# Patient Record
Sex: Male | Born: 1965 | Race: White | Hispanic: No | Marital: Married | Smoking: Former smoker
Health system: Southern US, Community
[De-identification: ages and names within clinical notes are randomized; demographics above are authoritative.]

## PROBLEM LIST (undated history)

## (undated) DIAGNOSIS — R609 Edema, unspecified: Secondary | ICD-10-CM

## (undated) DIAGNOSIS — IMO0002 Reserved for concepts with insufficient information to code with codable children: Secondary | ICD-10-CM

## (undated) DIAGNOSIS — I509 Heart failure, unspecified: Secondary | ICD-10-CM

## (undated) DIAGNOSIS — G4730 Sleep apnea, unspecified: Secondary | ICD-10-CM

## (undated) DIAGNOSIS — F319 Bipolar disorder, unspecified: Secondary | ICD-10-CM

## (undated) DIAGNOSIS — E785 Hyperlipidemia, unspecified: Secondary | ICD-10-CM

## (undated) DIAGNOSIS — Z789 Other specified health status: Secondary | ICD-10-CM

## (undated) DIAGNOSIS — M1990 Unspecified osteoarthritis, unspecified site: Secondary | ICD-10-CM

## (undated) DIAGNOSIS — G629 Polyneuropathy, unspecified: Secondary | ICD-10-CM

## (undated) DIAGNOSIS — E039 Hypothyroidism, unspecified: Secondary | ICD-10-CM

## (undated) DIAGNOSIS — E119 Type 2 diabetes mellitus without complications: Secondary | ICD-10-CM

## (undated) DIAGNOSIS — R9430 Abnormal result of cardiovascular function study, unspecified: Secondary | ICD-10-CM

## (undated) DIAGNOSIS — E669 Obesity, unspecified: Secondary | ICD-10-CM

## (undated) DIAGNOSIS — T56891A Toxic effect of other metals, accidental (unintentional), initial encounter: Secondary | ICD-10-CM

## (undated) DIAGNOSIS — I872 Venous insufficiency (chronic) (peripheral): Secondary | ICD-10-CM

## (undated) DIAGNOSIS — I10 Essential (primary) hypertension: Secondary | ICD-10-CM

## (undated) DIAGNOSIS — F209 Schizophrenia, unspecified: Secondary | ICD-10-CM

## (undated) HISTORY — DX: Bipolar disorder, unspecified: F31.9

## (undated) HISTORY — DX: Reserved for concepts with insufficient information to code with codable children: IMO0002

## (undated) HISTORY — DX: Hypothyroidism, unspecified: E03.9

## (undated) HISTORY — DX: Toxic effect of other metals, accidental (unintentional), initial encounter: T56.891A

## (undated) HISTORY — DX: Abnormal result of cardiovascular function study, unspecified: R94.30

## (undated) HISTORY — DX: Sleep apnea, unspecified: G47.30

## (undated) HISTORY — DX: Other specified health status: Z78.9

## (undated) HISTORY — DX: Edema, unspecified: R60.9

## (undated) HISTORY — DX: Venous insufficiency (chronic) (peripheral): I87.2

## (undated) HISTORY — PX: TONSILLECTOMY: SUR1361

## (undated) HISTORY — DX: Obesity, unspecified: E66.9

## (undated) HISTORY — DX: Schizophrenia, unspecified: F20.9

---

## 2001-05-10 ENCOUNTER — Encounter (INDEPENDENT_AMBULATORY_CARE_PROVIDER_SITE_OTHER): Payer: Self-pay | Admitting: *Deleted

## 2001-05-10 ENCOUNTER — Ambulatory Visit (HOSPITAL_BASED_OUTPATIENT_CLINIC_OR_DEPARTMENT_OTHER): Admission: RE | Admit: 2001-05-10 | Discharge: 2001-05-11 | Payer: Self-pay | Admitting: Otolaryngology

## 2001-05-18 ENCOUNTER — Encounter: Payer: Self-pay | Admitting: Otolaryngology

## 2001-05-18 ENCOUNTER — Encounter: Admission: RE | Admit: 2001-05-18 | Discharge: 2001-05-18 | Payer: Self-pay | Admitting: Otolaryngology

## 2001-06-03 ENCOUNTER — Encounter: Payer: Self-pay | Admitting: Otolaryngology

## 2001-06-03 ENCOUNTER — Encounter: Admission: RE | Admit: 2001-06-03 | Discharge: 2001-06-03 | Payer: Self-pay | Admitting: Otolaryngology

## 2004-04-16 ENCOUNTER — Ambulatory Visit: Payer: Self-pay | Admitting: Family Medicine

## 2005-01-14 ENCOUNTER — Ambulatory Visit: Payer: Self-pay | Admitting: Psychiatry

## 2005-01-14 ENCOUNTER — Inpatient Hospital Stay (HOSPITAL_COMMUNITY): Admission: AD | Admit: 2005-01-14 | Discharge: 2005-01-17 | Payer: Self-pay | Admitting: Psychiatry

## 2005-03-11 ENCOUNTER — Ambulatory Visit: Payer: Self-pay | Admitting: Family Medicine

## 2005-10-23 ENCOUNTER — Ambulatory Visit: Payer: Self-pay | Admitting: Family Medicine

## 2005-10-30 ENCOUNTER — Ambulatory Visit: Payer: Self-pay | Admitting: Internal Medicine

## 2005-11-03 ENCOUNTER — Ambulatory Visit (HOSPITAL_COMMUNITY): Admission: RE | Admit: 2005-11-03 | Discharge: 2005-11-03 | Payer: Self-pay | Admitting: Internal Medicine

## 2005-11-03 ENCOUNTER — Ambulatory Visit: Payer: Self-pay | Admitting: Internal Medicine

## 2005-11-04 ENCOUNTER — Ambulatory Visit (HOSPITAL_COMMUNITY): Admission: RE | Admit: 2005-11-04 | Discharge: 2005-11-04 | Payer: Self-pay | Admitting: Internal Medicine

## 2006-03-31 ENCOUNTER — Ambulatory Visit: Payer: Self-pay | Admitting: Family Medicine

## 2006-07-15 ENCOUNTER — Ambulatory Visit: Payer: Self-pay | Admitting: Family Medicine

## 2006-09-18 ENCOUNTER — Ambulatory Visit: Payer: Self-pay | Admitting: Family Medicine

## 2006-09-22 ENCOUNTER — Ambulatory Visit: Payer: Self-pay | Admitting: Family Medicine

## 2006-10-15 ENCOUNTER — Ambulatory Visit: Payer: Self-pay | Admitting: Family Medicine

## 2007-05-31 ENCOUNTER — Encounter
Admission: RE | Admit: 2007-05-31 | Discharge: 2007-08-12 | Payer: Self-pay | Admitting: Physical Medicine & Rehabilitation

## 2007-05-31 ENCOUNTER — Ambulatory Visit: Payer: Self-pay | Admitting: Physical Medicine & Rehabilitation

## 2007-06-23 ENCOUNTER — Encounter
Admission: RE | Admit: 2007-06-23 | Discharge: 2007-06-24 | Payer: Self-pay | Admitting: Physical Medicine & Rehabilitation

## 2007-06-23 ENCOUNTER — Ambulatory Visit: Payer: Self-pay | Admitting: Physical Medicine & Rehabilitation

## 2007-07-30 ENCOUNTER — Encounter
Admission: RE | Admit: 2007-07-30 | Discharge: 2007-07-30 | Payer: Self-pay | Admitting: Physical Medicine & Rehabilitation

## 2007-09-01 ENCOUNTER — Encounter
Admission: RE | Admit: 2007-09-01 | Discharge: 2007-09-03 | Payer: Self-pay | Admitting: Physical Medicine & Rehabilitation

## 2007-09-03 ENCOUNTER — Ambulatory Visit: Payer: Self-pay | Admitting: Physical Medicine & Rehabilitation

## 2007-09-15 ENCOUNTER — Encounter
Admission: RE | Admit: 2007-09-15 | Discharge: 2007-12-14 | Payer: Self-pay | Admitting: Physical Medicine & Rehabilitation

## 2007-09-20 ENCOUNTER — Ambulatory Visit: Payer: Self-pay | Admitting: Physical Medicine & Rehabilitation

## 2007-11-22 ENCOUNTER — Ambulatory Visit: Payer: Self-pay | Admitting: Physical Medicine & Rehabilitation

## 2007-12-17 ENCOUNTER — Encounter
Admission: RE | Admit: 2007-12-17 | Discharge: 2007-12-17 | Payer: Self-pay | Admitting: Physical Medicine & Rehabilitation

## 2008-03-17 ENCOUNTER — Encounter
Admission: RE | Admit: 2008-03-17 | Discharge: 2008-05-09 | Payer: Self-pay | Admitting: Physical Medicine & Rehabilitation

## 2008-03-20 ENCOUNTER — Ambulatory Visit: Payer: Self-pay | Admitting: Physical Medicine & Rehabilitation

## 2008-04-06 ENCOUNTER — Ambulatory Visit: Payer: Self-pay | Admitting: Physical Medicine & Rehabilitation

## 2008-04-06 ENCOUNTER — Encounter
Admission: RE | Admit: 2008-04-06 | Discharge: 2008-07-05 | Payer: Self-pay | Admitting: Physical Medicine & Rehabilitation

## 2008-05-09 ENCOUNTER — Ambulatory Visit: Payer: Self-pay | Admitting: Physical Medicine & Rehabilitation

## 2008-06-09 ENCOUNTER — Ambulatory Visit: Payer: Self-pay | Admitting: Physical Medicine & Rehabilitation

## 2008-07-25 ENCOUNTER — Encounter
Admission: RE | Admit: 2008-07-25 | Discharge: 2008-10-10 | Payer: Self-pay | Admitting: Physical Medicine & Rehabilitation

## 2008-07-26 ENCOUNTER — Ambulatory Visit: Payer: Self-pay | Admitting: Physical Medicine & Rehabilitation

## 2008-08-23 ENCOUNTER — Ambulatory Visit: Payer: Self-pay | Admitting: Physical Medicine & Rehabilitation

## 2008-09-22 ENCOUNTER — Ambulatory Visit: Payer: Self-pay | Admitting: Physical Medicine & Rehabilitation

## 2008-10-10 ENCOUNTER — Encounter
Admission: RE | Admit: 2008-10-10 | Discharge: 2009-01-08 | Payer: Self-pay | Admitting: Physical Medicine & Rehabilitation

## 2008-10-20 ENCOUNTER — Ambulatory Visit: Payer: Self-pay | Admitting: Physical Medicine & Rehabilitation

## 2008-11-29 ENCOUNTER — Ambulatory Visit: Payer: Self-pay | Admitting: Physical Medicine & Rehabilitation

## 2009-01-05 ENCOUNTER — Ambulatory Visit: Payer: Self-pay | Admitting: Physical Medicine & Rehabilitation

## 2009-01-24 ENCOUNTER — Encounter
Admission: RE | Admit: 2009-01-24 | Discharge: 2009-04-24 | Payer: Self-pay | Admitting: Physical Medicine & Rehabilitation

## 2009-01-26 ENCOUNTER — Ambulatory Visit: Payer: Self-pay | Admitting: Physical Medicine & Rehabilitation

## 2009-02-23 ENCOUNTER — Ambulatory Visit: Payer: Self-pay | Admitting: Physical Medicine & Rehabilitation

## 2009-04-03 ENCOUNTER — Ambulatory Visit: Payer: Self-pay | Admitting: Physical Medicine & Rehabilitation

## 2009-05-01 ENCOUNTER — Encounter
Admission: RE | Admit: 2009-05-01 | Discharge: 2009-05-30 | Payer: Self-pay | Admitting: Physical Medicine & Rehabilitation

## 2009-05-02 ENCOUNTER — Ambulatory Visit: Payer: Self-pay | Admitting: Physical Medicine & Rehabilitation

## 2010-07-23 ENCOUNTER — Inpatient Hospital Stay (HOSPITAL_COMMUNITY)
Admission: AD | Admit: 2010-07-23 | Discharge: 2010-07-26 | DRG: 301 | Disposition: A | Discharge status: Home / Self Care | Payer: MEDICARE | Source: Ambulatory Visit | Attending: Internal Medicine | Admitting: Internal Medicine

## 2010-07-23 DIAGNOSIS — K219 Gastro-esophageal reflux disease without esophagitis: Secondary | ICD-10-CM | POA: Diagnosis present

## 2010-07-23 DIAGNOSIS — G4733 Obstructive sleep apnea (adult) (pediatric): Secondary | ICD-10-CM | POA: Diagnosis present

## 2010-07-23 DIAGNOSIS — I872 Venous insufficiency (chronic) (peripheral): Principal | ICD-10-CM | POA: Diagnosis present

## 2010-07-23 DIAGNOSIS — I10 Essential (primary) hypertension: Secondary | ICD-10-CM | POA: Diagnosis present

## 2010-07-23 DIAGNOSIS — F209 Schizophrenia, unspecified: Secondary | ICD-10-CM | POA: Diagnosis present

## 2010-07-23 DIAGNOSIS — F17200 Nicotine dependence, unspecified, uncomplicated: Secondary | ICD-10-CM | POA: Diagnosis present

## 2010-07-23 DIAGNOSIS — E7800 Pure hypercholesterolemia, unspecified: Secondary | ICD-10-CM | POA: Diagnosis present

## 2010-07-24 ENCOUNTER — Inpatient Hospital Stay (HOSPITAL_COMMUNITY): Payer: MEDICARE

## 2010-07-24 LAB — CBC
HCT: 38.5 % — ABNORMAL LOW (ref 39.0–52.0)
MCHC: 30.6 g/dL (ref 30.0–36.0)
Platelets: 389 10*3/uL (ref 150–400)
RDW: 15.5 % (ref 11.5–15.5)
WBC: 7.9 10*3/uL (ref 4.0–10.5)

## 2010-07-24 LAB — COMPREHENSIVE METABOLIC PANEL
Albumin: 3.5 g/dL (ref 3.5–5.2)
BUN: 4 mg/dL — ABNORMAL LOW (ref 6–23)
CO2: 32 mEq/L (ref 19–32)
Calcium: 9.1 mg/dL (ref 8.4–10.5)
Chloride: 102 mEq/L (ref 96–112)
Creatinine, Ser: 0.73 mg/dL (ref 0.4–1.5)
GFR calc Af Amer: >60 mL/min (ref >60)
GFR calc non Af Amer: >60 mL/min (ref >60)
Total Bilirubin: 0.4 mg/dL (ref 0.3–1.2)

## 2010-07-24 LAB — LITHIUM LEVEL: Lithium Lvl: 0.4 mEq/L — ABNORMAL LOW (ref 0.80–1.40)

## 2010-07-24 LAB — DIFFERENTIAL
Basophils Absolute: 0.1 10*3/uL (ref 0.0–0.1)
Eosinophils Absolute: 0.5 10*3/uL (ref 0.0–0.7)
Eosinophils Relative: 7 % — ABNORMAL HIGH (ref 0–5)
Lymphocytes Relative: 28 % (ref 12–46)
Monocytes Absolute: 0.9 10*3/uL (ref 0.1–1.0)

## 2010-07-24 LAB — URINALYSIS, ROUTINE W REFLEX MICROSCOPIC
Nitrite: NEGATIVE
Specific Gravity, Urine: <1.005 — ABNORMAL LOW (ref 1.005–1.030)
Urobilinogen, UA: 0.2 mg/dL (ref 0.0–1.0)
pH: 7 (ref 5.0–8.0)

## 2010-07-24 LAB — TSH: TSH: 0.836 u[IU]/mL (ref 0.350–4.500)

## 2010-07-25 LAB — URINE CULTURE: Colony Count: NO GROWTH

## 2010-07-25 LAB — CBC
Platelets: 406 10*3/uL — ABNORMAL HIGH (ref 150–400)
RBC: 4.14 MIL/uL — ABNORMAL LOW (ref 4.22–5.81)
WBC: 10.4 10*3/uL (ref 4.0–10.5)

## 2010-07-25 LAB — DIFFERENTIAL
Basophils Absolute: 0.1 10*3/uL (ref 0.0–0.1)
Basophils Relative: 1 % (ref 0–1)
Eosinophils Absolute: 0.3 10*3/uL (ref 0.0–0.7)
Neutro Abs: 6.9 10*3/uL (ref 1.7–7.7)
Neutrophils Relative %: 67 % (ref 43–77)

## 2010-07-25 LAB — BASIC METABOLIC PANEL
Calcium: 9.4 mg/dL (ref 8.4–10.5)
GFR calc Af Amer: >60 mL/min (ref >60)
GFR calc non Af Amer: >60 mL/min (ref >60)
Glucose, Bld: 113 mg/dL — ABNORMAL HIGH (ref 70–99)
Sodium: 140 mEq/L (ref 135–145)

## 2010-07-25 LAB — TSH: TSH: 0.292 u[IU]/mL — ABNORMAL LOW (ref 0.350–4.500)

## 2010-07-25 LAB — BRAIN NATRIURETIC PEPTIDE: Pro B Natriuretic peptide (BNP): <30 pg/mL (ref 0.0–100.0)

## 2010-07-25 LAB — T3, FREE: T3, Free: 2.7 pg/mL (ref 2.3–4.2)

## 2010-07-25 LAB — D-DIMER, QUANTITATIVE: D-Dimer, Quant: 0.49 ug/mL-FEU — ABNORMAL HIGH (ref 0.00–0.48)

## 2010-07-25 LAB — MAGNESIUM: Magnesium: 2.3 mg/dL (ref 1.5–2.5)

## 2010-07-26 LAB — BASIC METABOLIC PANEL
CO2: 28 mEq/L (ref 19–32)
Chloride: 104 mEq/L (ref 96–112)
GFR calc Af Amer: >60 mL/min (ref >60)
Sodium: 142 mEq/L (ref 135–145)

## 2010-07-26 LAB — CBC
Hemoglobin: 12.1 g/dL — ABNORMAL LOW (ref 13.0–17.0)
MCH: 27.9 pg (ref 26.0–34.0)
RBC: 4.33 MIL/uL (ref 4.22–5.81)

## 2010-07-26 LAB — DIFFERENTIAL
Basophils Absolute: 0 10*3/uL (ref 0.0–0.1)
Basophils Relative: 0 % (ref 0–1)
Lymphocytes Relative: 24 % (ref 12–46)
Monocytes Relative: 11 % (ref 3–12)
Neutro Abs: 5.5 10*3/uL (ref 1.7–7.7)
Neutrophils Relative %: 61 % (ref 43–77)

## 2010-07-29 NOTE — Discharge Summary (Signed)
  Mark, Lester               ACCOUNT NO.:  [id number]  MEDICAL RECORD NO.:  [id number]           PATIENT TYPE:  I  LOCATION:  A339                          FACILITY:  [facility]  PHYSICIAN:  Wilson Singer, M.D.DATE OF BIRTH:  [birth year]  DATE OF ADMISSION:  07/23/2010 DATE OF DISCHARGE:  02/24/2012LH                              DISCHARGE SUMMARY   FINAL DISCHARGE DIAGNOSIS:  Lower extremity swelling and edema, likely secondary to venous insufficiency.  No evidence of congestive heart failure.  CONDITION ON DISCHARGE:  Stable.  MEDICATIONS ON DISCHARGE: 1. Lasix 40 mg b.i.d. 2. Spironolactone 50 mg daily. 3. Continue home medications including hydrocodone/APAP 10/325 one     tablet q.i.d. p.r.n. 4. Simvastatin 40 mg at bedtime. 5. Topiramate 100 mg at bedtime. 6. Dicyclomine 20 mg q.i.d. 7. Clomipramine 25 mg 1-2 tablets b.i.d. 8. Nexium 40 mg daily. 9. Lithium 300 mg 2 tablets b.i.d. 10.Amlodipine 5 mg daily. 11.Levothyroxine 100 mcg daily. 12.Alprazolam 1 mg q.i.d. 13.Metoclopramide 10 mg p.r.n. 14.Venlafaxine 75 mg t.i.d.  HISTORY:  This pleasant 45 year old man was admitted with anasarca. Please see initial history and physical examination done by Dr. Houston Siren.  HOSPITAL PROGRESS:  The patient initially was felt to be in congestive heart failure and therefore he was diuresed with intravenous diuretics. His weight on admission was 170.1 kg and on the day of discharge, today, is 163.6 kg, resulting in a total weight loss in the region of almost 6- 1/2 kg.  He was also seen by Cardiology and an echocardiogram was done. An echocardiogram did not show any evidence of systolic dysfunction nor was there any suggestion of diastolic dysfunction.  More interestingly, there was no evidence of right heart failure.  His BNP consistently remained less than 30.  It was reviewed by Dr. Rennis Golden, cardiologist, with whom I agree with that this most likely represents lower  extremity venous insufficiency, accounting for his leg swelling.  He clearly feels much improved on intravenous diuretics therapy and he can see that his legs have clearly improved.  On physical examination, he is doing well. Vital signs; temperature 97.7, blood pressure 138/82, pulse 78, saturation 94% on room air.  As mentioned, his current weight today is 163.6 kg.  Legs are much less swollen with very little pitting edema now.  Lung fields are clear.  Heart sounds are present and normal.  INVESTIGATIONS:  Today, show sodium of 142, potassium 3.6, bicarbonate 28, BUN 5, creatinine 0.74.  Hemoglobin 12.1, white blood cell count 9.1, platelets 406.  DISPOSITION:  The patient is now stable to be discharged home.  He will continue with a CPAP machine for his obstructive sleep apnea.  He needs to follow up with Dr. Rennis Golden, the cardiologist, in approximately 1 week's time to further evaluate him and with ongoing management of his peripheral edema.     Wilson Singer, M.D.     NCG/MEDQ  D:  07/26/2010  T:  07/27/2010  Job:  [id number] cc:   Italy Hilty, MD  Electronically Signed by Lilly Cove M.D. on 07/29/2010 11:52:35 AM

## 2010-08-12 NOTE — H&P (Signed)
[name], [name]               ACCOUNT NO.:  [id number]  MEDICAL RECORD NO.:  [id number]           PATIENT TYPE:  I  LOCATION:  A339                          FACILITY:  [facility]  PHYSICIAN:  Houston Siren, MD           DATE OF BIRTH:  1966/01/03  DATE OF ADMISSION:  07/23/2010 DATE OF DISCHARGE:  LH                             HISTORY & PHYSICAL   ADVANCE DIRECTIVE:  Full code.  REASON FOR ADMISSION:  Anasarca.  HISTORY OF PRESENT ILLNESS:  This is a 45 year old male, morbidly obese extreme noncompliant, along with having history of sleep apnea, hypertension and asthma, hypercholesterolemia, GERD and unfortunately is still a smoker, sent in by his cardiologist to be diuresed.  Apparently, he has been having swelling and redness of both of his lower extremities.  He denied any chest pain, but admitted to having shortness of breath and some orthopnea.  He denied any fever or chills, nausea or vomiting.  He has no diaphoresis and denied any productive cough.  I have no outpatient medical record, and his E-chart is rather scanty, but I do have a list of his medication.  Cardiology is aware of his admission and we will consult on him tomorrow morning.  PAST MEDICAL HISTORY:  Morbid obesity, noncompliance, sleep apnea, hypertension, asthma, hypercholesterolemia, tobacco use.  SOCIAL HISTORY:  He lives with his wife and as noted he is a smoker. Denies any excessive alcohol use.  Allergy to ASPIRIN and PENICILLIN.  FAMILY HISTORY:  Colorectal cancer, hypertension, and hypercholesterolemia.  CURRENT MEDICATIONS: 1. Effexor 75 mg t.i.d. 2. Reglan. 3. Alprazolam 2 mg half-tablet p.o. q.i.d. 4. Synthroid 100 mcg per day. 5. Norvasc 5 mg per day. 6. Lithium 300 mg 2 tablets p.o. b.i.d. 7. Nexium 40 mg per day. 8. Simvastatin 40 mg per day. 9. Topiramate 100 mg bedtime. 10.Clomipramine 25 mg one in the morning and 2 tablets in the evening. 11.Dicyclomine 20 mg q.i.d. 12.Lasix 80 mg  q.a.m.  PHYSICAL EXAMINATION:  VITAL SIGNS:  Blood pressure 144/80, O2 sat 96%, temp 98.7, pulse of 85. GENERAL:  He is morbidly obese, but no respiratory distress.  He is actually supple.  He has no stridor.  He has facial symmetry.  His speech is fluent.  Tongue is midline. NECK:  Supple. CARDIAC:  Revealed S1 and S2 regular.  I did not hear any murmur, rub, or gallop. LUNGS:  Without any wheezes.  Slight basilar crackles. ABDOMEN:  Obese, I am not able to appreciate any organomegaly. EXTREMITIES:  Show 4+ pitting edema bilaterally.  There is some erythema on his lower extremities as well.  No calf tenderness.  OBJECTIVE FINDING:  There has been no lab, EKG, or chest x-ray done.  IMPRESSION:  This is a 45 year old male with history of hypertension, sleep apnea, noncompliant, hypercholesterolemia, GERD, asthma, and presumably bipolar on several medications, admitted for bilateral lower leg swelling and anasarca.  If he has congestive heart failure, it is well compensated.  We will treat him with Lasix at 40 mg IV q.12 hours and hopefully, this will bypass intestinal edema.  Aldactone will  be a good medicine for him as well, and I will start him on 50 mg per day.  He has sleep apnea, and he absolutely must wear his CPAP machine.  I spent some time discussing that, and he is agreeable to start wearing it tonight. He should also stop smoking.  Of his medications, Norvasc might cause lower extremity edema and thus I will stop that.  He has been on lithium and I will check a TSH along with lithium level.  We will continue his other medications except for the clomipramine which may cause weight gain, I will hold that right now as well.  Cardiology will see him, and I will get admitting labs to include BMP, CBC, CMET.  We will get EKG and chest x-ray as well.  If he has not had an echo, he certainly will need one.  He does have potential lower extremity cellulitis, and I will start him on  Avelox IV daily.  He is a full code, will be admitted to [facility], Team-1.     Houston Siren, MD     PL/MEDQ  D:  07/24/2010  T:  07/24/2010  Job:  [id number]  Electronically Signed by Houston Siren  on 08/12/2010 05:54:22 AM

## 2010-09-09 ENCOUNTER — Other Ambulatory Visit (HOSPITAL_COMMUNITY): Payer: Self-pay | Admitting: Family Medicine

## 2010-09-09 DIAGNOSIS — R51 Headache: Secondary | ICD-10-CM

## 2010-09-09 DIAGNOSIS — R4789 Other speech disturbances: Secondary | ICD-10-CM

## 2010-09-11 ENCOUNTER — Other Ambulatory Visit (HOSPITAL_COMMUNITY): Payer: MEDICARE

## 2010-09-12 ENCOUNTER — Inpatient Hospital Stay (HOSPITAL_COMMUNITY)
Admission: EM | Admit: 2010-09-12 | Discharge: 2010-09-16 | DRG: 897 | Disposition: A | Discharge status: Home / Self Care | Payer: Medicare Other | Attending: Internal Medicine | Admitting: Internal Medicine

## 2010-09-12 ENCOUNTER — Inpatient Hospital Stay (HOSPITAL_COMMUNITY): Payer: Medicare Other

## 2010-09-12 ENCOUNTER — Emergency Department (HOSPITAL_COMMUNITY): Payer: Medicare Other

## 2010-09-12 DIAGNOSIS — I4891 Unspecified atrial fibrillation: Secondary | ICD-10-CM | POA: Diagnosis present

## 2010-09-12 DIAGNOSIS — E876 Hypokalemia: Secondary | ICD-10-CM | POA: Diagnosis present

## 2010-09-12 DIAGNOSIS — D649 Anemia, unspecified: Secondary | ICD-10-CM | POA: Diagnosis present

## 2010-09-12 DIAGNOSIS — E039 Hypothyroidism, unspecified: Secondary | ICD-10-CM | POA: Diagnosis present

## 2010-09-12 DIAGNOSIS — G894 Chronic pain syndrome: Secondary | ICD-10-CM | POA: Diagnosis present

## 2010-09-12 DIAGNOSIS — K589 Irritable bowel syndrome without diarrhea: Secondary | ICD-10-CM | POA: Diagnosis present

## 2010-09-12 DIAGNOSIS — G4733 Obstructive sleep apnea (adult) (pediatric): Secondary | ICD-10-CM | POA: Diagnosis present

## 2010-09-12 DIAGNOSIS — T438X5A Adverse effect of other psychotropic drugs, initial encounter: Secondary | ICD-10-CM | POA: Diagnosis present

## 2010-09-12 DIAGNOSIS — E785 Hyperlipidemia, unspecified: Secondary | ICD-10-CM | POA: Diagnosis present

## 2010-09-12 DIAGNOSIS — N179 Acute kidney failure, unspecified: Secondary | ICD-10-CM | POA: Diagnosis present

## 2010-09-12 DIAGNOSIS — E871 Hypo-osmolality and hyponatremia: Secondary | ICD-10-CM | POA: Diagnosis present

## 2010-09-12 DIAGNOSIS — I509 Heart failure, unspecified: Secondary | ICD-10-CM | POA: Diagnosis present

## 2010-09-12 DIAGNOSIS — R471 Dysarthria and anarthria: Secondary | ICD-10-CM | POA: Diagnosis present

## 2010-09-12 DIAGNOSIS — F19921 Other psychoactive substance use, unspecified with intoxication with delirium: Principal | ICD-10-CM | POA: Diagnosis present

## 2010-09-12 DIAGNOSIS — F319 Bipolar disorder, unspecified: Secondary | ICD-10-CM | POA: Diagnosis present

## 2010-09-12 LAB — URINALYSIS, ROUTINE W REFLEX MICROSCOPIC
Nitrite: NEGATIVE
Specific Gravity, Urine: <1.005 — ABNORMAL LOW (ref 1.005–1.030)
pH: 6 (ref 5.0–8.0)

## 2010-09-12 LAB — COMPREHENSIVE METABOLIC PANEL
AST: 13 U/L (ref 0–37)
CO2: 24 mEq/L (ref 19–32)
Calcium: 9.1 mg/dL (ref 8.4–10.5)
Creatinine, Ser: 2.71 mg/dL — ABNORMAL HIGH (ref 0.4–1.5)
GFR calc Af Amer: 31 mL/min — ABNORMAL LOW (ref >60)
GFR calc non Af Amer: 25 mL/min — ABNORMAL LOW (ref >60)
Total Protein: 6.7 g/dL (ref 6.0–8.3)

## 2010-09-12 LAB — DIFFERENTIAL
Lymphocytes Relative: 16 % (ref 12–46)
Monocytes Absolute: 1.5 10*3/uL — ABNORMAL HIGH (ref 0.1–1.0)
Monocytes Relative: 13 % — ABNORMAL HIGH (ref 3–12)
Neutro Abs: 7.6 10*3/uL (ref 1.7–7.7)

## 2010-09-12 LAB — CBC
HCT: 33.2 % — ABNORMAL LOW (ref 39.0–52.0)
Hemoglobin: 10.7 g/dL — ABNORMAL LOW (ref 13.0–17.0)
MCH: 28.8 pg (ref 26.0–34.0)
MCHC: 32.2 g/dL (ref 30.0–36.0)

## 2010-09-12 LAB — RAPID URINE DRUG SCREEN, HOSP PERFORMED
Barbiturates: NOT DETECTED
Cocaine: NOT DETECTED
Opiates: POSITIVE — AB

## 2010-09-12 LAB — TROPONIN I: Troponin I: <0.01 ng/mL (ref 0.00–0.06)

## 2010-09-12 LAB — BLOOD GAS, ARTERIAL
Acid-base deficit: 1.5 mmol/L (ref 0.0–2.0)
Bicarbonate: 22.7 mEq/L (ref 20.0–24.0)
FIO2: 0.21 %
pCO2 arterial: 38 mmHg (ref 35.0–45.0)
pO2, Arterial: 78.3 mmHg — ABNORMAL LOW (ref 80.0–100.0)

## 2010-09-12 LAB — AMMONIA: Ammonia: 59 umol/L — ABNORMAL HIGH (ref 11–35)

## 2010-09-12 LAB — BRAIN NATRIURETIC PEPTIDE: Pro B Natriuretic peptide (BNP): 238 pg/mL — ABNORMAL HIGH (ref 0.0–100.0)

## 2010-09-12 LAB — CARDIAC PANEL(CRET KIN+CKTOT+MB+TROPI): CK, MB: 0.8 ng/mL (ref 0.3–4.0)

## 2010-09-13 ENCOUNTER — Inpatient Hospital Stay (HOSPITAL_COMMUNITY): Payer: Medicare Other

## 2010-09-13 LAB — RENAL FUNCTION PANEL
CO2: 24 mEq/L (ref 19–32)
Calcium: 8.4 mg/dL (ref 8.4–10.5)
GFR calc Af Amer: 36 mL/min — ABNORMAL LOW (ref >60)
GFR calc non Af Amer: 30 mL/min — ABNORMAL LOW (ref >60)
Sodium: 132 mEq/L — ABNORMAL LOW (ref 135–145)

## 2010-09-13 LAB — CARDIAC PANEL(CRET KIN+CKTOT+MB+TROPI)
CK, MB: 0.8 ng/mL (ref 0.3–4.0)
Relative Index: INVALID (ref 0.0–2.5)
Total CK: 39 U/L (ref 7–232)
Total CK: 46 U/L (ref 7–232)

## 2010-09-13 LAB — IRON AND TIBC
Iron: 67 ug/dL (ref 42–135)
TIBC: 292 ug/dL (ref 215–435)

## 2010-09-13 LAB — DIFFERENTIAL
Basophils Absolute: 0 10*3/uL (ref 0.0–0.1)
Lymphocytes Relative: 24 % (ref 12–46)
Lymphs Abs: 2.8 10*3/uL (ref 0.7–4.0)
Monocytes Relative: 12 % (ref 3–12)
Neutrophils Relative %: 62 % (ref 43–77)

## 2010-09-13 LAB — FERRITIN: Ferritin: 237 ng/mL (ref 22–322)

## 2010-09-13 LAB — CBC
HCT: 30 % — ABNORMAL LOW (ref 39.0–52.0)
Hemoglobin: 9.7 g/dL — ABNORMAL LOW (ref 13.0–17.0)
RDW: 14.4 % (ref 11.5–15.5)
WBC: 11.7 10*3/uL — ABNORMAL HIGH (ref 4.0–10.5)

## 2010-09-13 LAB — VITAMIN B12: Vitamin B-12: 238 pg/mL (ref 211–911)

## 2010-09-13 LAB — LIPID PANEL
LDL Cholesterol: 47 mg/dL (ref 0–99)
Triglycerides: 263 mg/dL — ABNORMAL HIGH (ref <150)

## 2010-09-13 LAB — AMMONIA: Ammonia: 61 umol/L — ABNORMAL HIGH (ref 11–35)

## 2010-09-13 NOTE — H&P (Signed)
NAME:  [name], [name]               ACCOUNT NO.:  [id number]  MEDICAL RECORD NO.:  [id number]           PATIENT TYPE:  I  LOCATION:  A316                          FACILITY:  [facility]  PHYSICIAN:  Elliot Cousin, M.D.    DATE OF BIRTH:  [birth year]  DATE OF ADMISSION:  09/12/2010 DATE OF DISCHARGE:  LH                             HISTORY & PHYSICAL   PRIMARY CARE PHYSICIAN:  Delaney Meigs, MD  PRIMARY CARDIOLOGIST:  Italy Hilty, MD  CHIEF COMPLAINT:  Shortness of breath, recent increase in abdominal girth, recent increase in leg swelling, loose stools, and increase in sleepiness.  HISTORY OF PRESENT ILLNESS:  The patient is a 45 year old man with a past medical history significant for presumed right heart dysfunction leading to congestive heart failure, bilateral lower extremity edema from both right heart failure and venous insufficiency, bipolar disorder, obstructive sleep apnea, and chronic pain syndrome.  He presents to the emergency department today with a chief complaint of shortness of breath, loose stools, recent leg swelling, and an increase in abdominal girth.  The patient's wife and sister report that he has been mildly confused, has had an increase in sleepiness, and has had a couple of falls, mostly nontraumatic.  They believe that the patient is abusing MS Contin and hydrocodone.  He is prescribed both. However, he frequently runs out too early and when he runs out, he acquires these medications off the street.  Apparently over the past several days, he has had an increase in bilateral lower extremity swelling and an increase in abdominal girth, which he attributes to swelling.  His cardiologist, Dr. Rennis Golden increased his Lasix from 60 mg twice daily to 80 mg twice daily, increased the spironolactone from 50 mg daily to 100 mg daily, and added lisinopril. The patient also has chronic abdominal pain and chronic loose stools secondary to IBS.  Generally in a 2-day period,  he has approximately 10 loose stools which he says is not unusual for him.  His urine output has decreased significantly.  His shortness of breath has been at rest and with activity.  He denies pleurisy.  He denies chest pain.  He occasionally has symptoms consistent with orthopnea.  He has had nausea and dry heaves, but no outright vomiting.  Occasionally, he has discomfort with urination, but not lately.  He has had no subjective fever or chills.  He has chronic pain in his knees, which he believes has attributed to his falls.  His sister and wife believe that he is overmedicating himself and becomes too sedated to walk. They also noticed recent tremors.  In the emergency department, the patient is afebrile and hemodynamically stable although his heart rate was initially 109.  His lab data are significant for BNP of 238, a serum sodium of 130, serum potassium of 3.3, BUN of 24, creatinine of 2.71, WBC of 11.2, hemoglobin of 10.7, and a chest x-ray that reveals no acute abnormalities including no evidence of pulmonary edema.  He is being admitted for further evaluation and management.  PAST MEDICAL HISTORY: 1. 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  The patient's 2-D  echocardiogram on July 24, 2010 revealed an ejection fraction     of 50%-55%, normal diastolic function, mildly dilated left atrium,     and mildly dilated right ventricle. 2. Hypertension. 3. Morbid obesity. 4. Obstructive sleep apnea. 5. Hypothyroidism. 6. Asthma. 7. Hyperlipidemia. 8. Hiatal hernia, antral erythema, per EGD in June 2007 by Dr. Karilyn Cota. 9. Normal colonoscopy with the exception of hemorrhoids per     colonoscopy in June 2007 by Dr. Karilyn Cota. 10.Chronic low back pain with a history of L4-L5 and sacroiliac     injections.  He is followed by [facility]. 11.Bipolar disorder, depression, and generalized anxiety.  MEDICATIONS: 1. Lasix 80 mg b.i.d., recently increased from 60 mg  b.i.d. 2. Spironolactone 100 mg daily, recently increased from 50 mg daily. 3. Morphine (MS Contin) 60 mg b.i.d. 4. Hydrocodone/APAP 03/324 mg four times daily. 5. Simvastatin 40 mg at bedtime. 6. Lisinopril 5 mg daily, recently added. 7. Nexium 40 mg daily. 8. Clomipramine 25 mg 1-2 tablets twice daily. 9. Synthroid 100 mcg daily. 10.Amlodipine 5 mg daily. 11.Lithium carbonate 300 mg 2 tablets b.i.d. 12.Alprazolam 1 mg four times daily. 13.Dicyclomine 20 mg four times daily. 14.Topiramate 100 mg at bedtime. 15.Venlafaxine 75 mg three times daily. 16.Temazepam 60 mg at bedtime. 17.Metoclopramide 10 mg q.a.c. and at bedtime p.r.n.  ALLERGIES:  The patient has allergies to ASPIRIN AND PENICILLIN.  SOCIAL HISTORY:  The patient is married.  [place]  He has no children.  He is disabled.  He is a former Forensic psychologist.  He smokes two and half packs of cigarettes per day.  He stopped drinking alcohol many years ago.  He denies illicit or street drugs.  FAMILY HISTORY:  His father is 62 years of age and he has degenerative joint disease.  His sister has a history of diverticulitis. His mother is 31 years of age and has hypertension.  REVIEW OF SYSTEMS:  As above in the history present illness.  In addition, the patient has chronic low back pain, chronic bilateral knee pain, chronic intermittent swelling in his legs, otherwise review of systems is negative.  PHYSICAL EXAMINATION:  VITAL SIGNS:  Temperature 97.9, blood pressure 121/45, pulse 77, respiratory rate 20, oxygen saturation 100%. GENERAL:  The patient is a 45 year old obese Caucasian man who is currently lying in bed, in no acute distress. HEENT:  Head is normocephalic, nontraumatic.  Pupils are equal, round, reactive to light.  Extraocular muscles are intact.  Conjunctivae are clear.  Sclerae are white.  Tympanic membranes not examined.  Nasal mucosa is dry.  No sinus tenderness.   Oropharynx reveals dry mucous membranes.  No posterior exudates or erythema. NECK:  Supple.  No adenopathy, no thyromegaly, no bruit or JVD. LUNGS:  Clear to auscultation bilaterally. HEART:  S1, S2 with ectopy versus irregularly irregular. ABDOMEN:  Morbidly obese, positive bowel sounds, soft, nontender, nondistended and no hepatosplenomegaly.  No obvious fluid wave. GU AND RECTAL:  Deferred. EXTREMITIES:  Pedal pulses palpable bilaterally.  No pretibial edema and no pedal edema.  No acute hot red joints.  No ecchymotic lesions, bruises or bumps.  The patient is able to raise each leg against gravity approximately 40 degrees.  He is able to raise both arms 90 degrees without difficulty. NEUROLOGIC:  The patient is initially lethargic and has mild dysarthria. He answers questions appropriately, but then he falls back to sleep.  When the nurse is attempting to insert  a Foley catheter, he becomes more alert and mildly agitated and refuses to have a catheter inserted.  Cranial nerves II-XII appeared to be intact with exception of mild dysarthria and mild ptosis bilaterally consistent. There is a mild tremor in all extremities with some muscle jerks. Strength is 5-/5 in the supine position.  Sensation is grossly intact.  ADMISSION LABORATORIES:  EKG reveals atrial fibrillation with junctional pacemaker rate and occasional PVCs, prolonged QT interval, and a heart rate of 79 beats per minute.  CT scan of the head without contrast reveals mild generalized atrophy, area of questionable low attenuation within the right cerebellar hemisphere, question artifact versus infarct, and no other intracranial abnormalities identified.  BNP 238. ABG on room air revealed a pH of 7.39, pCO2 of 38, pO2 of 78.3, and bicarb of 22.7.  Sodium 130, potassium 3.3, chloride 95, CO2 24, glucose 111, BUN 24, creatinine 2.71, total bilirubin 0.4, alkaline phosphatase 72, SGOT 13, SGPT 10, total protein 6.7, albumin  3.7, calcium 9.1, CK-MB less than 1.0, troponin I less than 0.05, myoglobin 290.  WBC 11.2, hemoglobin 10.7, platelet count 358.  ASSESSMENT: 1. Shortness of breath with no evidence of pulmonary edema or     congestive heart failure on the chest x-ray and exam. 2. History of bilateral lower extremity edema.  The patient has no     bilateral knee lower extremity edema whatsoever.  He does probably     have a history of venous insufficiency due to his morbid obesity.     Diuretic therapy was recently increased. 3. Acute renal failure.  The patient's BUN is 24 and his creatinine is     2.71.  He has no history of acute renal failure recently per     records reviewed.  His creatinine in February 2012 was 0.74 and his     BUN was 5.  The acute renal failure is likely secondary to     chronically loose stools, diuretic therapy, and concomitant ACE     inhibitor therapy.  I am also concerned about lithium toxicity. 4. Hyponatremia.  The patient's serum sodium is 130.  It is likely     that he has hypovolemic hyponatremia. 5. Hypokalemia.  The hypokalemia is likely secondary to diuretic     therapy. 6. Atrial fibrillation and prolonged QT interval on the EKG.  The     patient has no history of atrial fibrillation.  The atrial     fibrillation may be paroxysmal.  His 2-D echocardiogram revealed no     significant valvular abnormalities in February 2012. 7. Anemia and leukocytosis.  The patient's hemoglobin is 10.7.  In     February, it was 12.1.  His WBC is elevated at 11.2.  This may be     reactive.  Urinary tract infection will need to be ruled out.  His     chest x-ray was free of pneumonia. 8. Delirium.  The patient's delirium may be multifactorial including     volume depletion, metabolic derangements, acute renal     failure, hyponatremia, and hypokalemia.  Lithium toxicity will need     to be ruled out as a potential cause.  Also, the patient apparently     has been abusing his opiate  medications.  He frequently runs out of     hydrocodone according to his sister and wife.  He then purchases     more hydrocodone off the street.  In addition, he has bipolar  disorder and is treated with multiple psychotropic medications. 9. Bipolar disorder, depression, and anxiety disorder.  The patient is     treated with a combination of topiramate, clomipramine, lithium,     alprazolam, venlafaxine, and temazepam. 10.Irritable bowel syndrome.  The patient is treated with a number of     medications including Nexium, dicyclomine, and metoclopramide.  PLAN: 1. We will hold Lasix, spironolactone, and lisinopril. 2. We will start hydration with normal saline and with potassium     chloride added.  We will replete his potassium chloride in the IV     fluids and orally as needed.  We will monitor his serum potassium     closely.  We will also monitor his urine output closely.  We     will attempt to insert a Foley catheter and if the patient refuses,     we will try a condom catheter. 3. We will check a renal ultrasound to assess for acute renal failure. 4. We will order a stat ammonia level and stat lithium level to assess     for delirium.  We will also order a urine drug screen. 5. We will order a D-dimer and cardiac enzyme to assess for subjective     shortness of breath.  If his D-dimer is elevated, then we will     order a V/Q scan to rule out PE. 6. We will order MRI of the brain to rule out an acute stroke.  The CT of the patient's head revealed a questionable low attenuation area     within the right cerebellar hemisphere, question artifact versus     infarct, acute infarction was not excluded. 7. We will add a nicotine patch daily and encouraged the patient to     stop smoking.  Tobacco cessation counseling will be ordered. 8. We will decrease the doses of his psychotropic medications.  We     will hold his lithium until a lithium level has been confirmed.  We     will  check a follow-up EKG in the morning. 9. We will check a urinalysis and urine culture.     Elliot Cousin, M.D.     DF/MEDQ  D:  09/12/2010  T:  09/12/2010  Job:  [id number]  cc:   Delaney Meigs, M.D. Fax: [phone]  Italy Hilty, MD  Electronically Signed by Elliot Cousin M.D. on 09/13/2010 02:06:54 PM

## 2010-09-14 ENCOUNTER — Inpatient Hospital Stay (HOSPITAL_COMMUNITY): Payer: Medicare Other

## 2010-09-14 LAB — RENAL FUNCTION PANEL
Albumin: 3.5 g/dL (ref 3.5–5.2)
BUN: 9 mg/dL (ref 6–23)
CO2: 22 mEq/L (ref 19–32)
Chloride: 108 mEq/L (ref 96–112)
Creatinine, Ser: 1.01 mg/dL (ref 0.4–1.5)
Glucose, Bld: 156 mg/dL — ABNORMAL HIGH (ref 70–99)
Phosphorus: 1.9 mg/dL — ABNORMAL LOW (ref 2.3–4.6)
Potassium: 3.7 mEq/L (ref 3.5–5.1)
Sodium: 138 mEq/L (ref 135–145)

## 2010-09-14 LAB — DIFFERENTIAL
Eosinophils Absolute: 0.2 10*3/uL (ref 0.0–0.7)
Lymphocytes Relative: 11 % — ABNORMAL LOW (ref 12–46)
Lymphs Abs: 1.3 10*3/uL (ref 0.7–4.0)
Monocytes Relative: 10 % (ref 3–12)
Neutrophils Relative %: 78 % — ABNORMAL HIGH (ref 43–77)

## 2010-09-14 LAB — CBC
HCT: 33.2 % — ABNORMAL LOW (ref 39.0–52.0)
MCH: 28.4 pg (ref 26.0–34.0)
MCV: 93.3 fL (ref 78.0–100.0)
Platelets: 341 10*3/uL (ref 150–400)
RBC: 3.56 MIL/uL — ABNORMAL LOW (ref 4.22–5.81)

## 2010-09-14 LAB — TSH: TSH: 0.543 u[IU]/mL (ref 0.350–4.500)

## 2010-09-14 LAB — HEPATIC FUNCTION PANEL
Bilirubin, Direct: <0.1 mg/dL (ref 0.0–0.3)
Total Bilirubin: 0.2 mg/dL — ABNORMAL LOW (ref 0.3–1.2)

## 2010-09-14 LAB — T4, FREE: Free T4: 0.98 ng/dL (ref 0.80–1.80)

## 2010-09-15 LAB — AMMONIA: Ammonia: 78 umol/L — ABNORMAL HIGH (ref 11–35)

## 2010-09-15 LAB — BASIC METABOLIC PANEL
BUN: 2 mg/dL — ABNORMAL LOW (ref 6–23)
Calcium: 8.9 mg/dL (ref 8.4–10.5)
Creatinine, Ser: 0.75 mg/dL (ref 0.4–1.5)
GFR calc non Af Amer: >60 mL/min (ref >60)

## 2010-09-16 LAB — DIFFERENTIAL
Basophils Relative: 1 % (ref 0–1)
Monocytes Absolute: 0.9 10*3/uL (ref 0.1–1.0)
Monocytes Relative: 10 % (ref 3–12)
Neutro Abs: 4.7 10*3/uL (ref 1.7–7.7)

## 2010-09-16 LAB — BASIC METABOLIC PANEL
BUN: 1 mg/dL — ABNORMAL LOW (ref 6–23)
Chloride: 108 mEq/L (ref 96–112)
GFR calc non Af Amer: >60 mL/min (ref >60)
Glucose, Bld: 111 mg/dL — ABNORMAL HIGH (ref 70–99)
Potassium: 3.8 mEq/L (ref 3.5–5.1)

## 2010-09-16 LAB — LITHIUM LEVEL: Lithium Lvl: 0.66 mEq/L — ABNORMAL LOW (ref 0.80–1.40)

## 2010-09-16 LAB — CBC
Hemoglobin: 11.2 g/dL — ABNORMAL LOW (ref 13.0–17.0)
MCH: 28.6 pg (ref 26.0–34.0)
MCHC: 30.8 g/dL (ref 30.0–36.0)

## 2010-09-17 NOTE — Discharge Summary (Signed)
Mark, Lester               ACCOUNT NO.:  [id number]  MEDICAL RECORD NO.:  [id number]           PATIENT TYPE:  I  LOCATION:  A316                          FACILITY:  [facility]  PHYSICIAN:  Elliot Cousin, M.D.    DATE OF BIRTH:  1966/02/09  DATE OF ADMISSION:  09/12/2010 DATE OF DISCHARGE:  04/16/2012LH                              DISCHARGE SUMMARY   DISCHARGE DIAGNOSES: 1. Delirium secondary to lithium toxicity with a contribution from     multiple psychotropic medications and elevated ammonia level.  The     patient's lithium level on admission was 2.28 and (0.80-1.40 normal     range).  His lithium level was 0.66 at the time of discharge. 2. Dysarthria, ptosis, and tremor secondary to lithium toxicity. 3. Acute renal failure, nonoliguric.  The patient's BUN was 24 and his     creatinine was 2.71 on admission.  At the time of hospital     discharge, his BUN was 1 and his creatinine was 0.79. 4. Elevated ammonia level without evidence of hepatic dysfunction.     The patient's blood ammonia level was 60 prior to hospital     discharge. 5. Cardiac arrhythmias including paroxysmal atrial fibrillation,     junctional pacemaker, prolonged QT interval, and bradycardia,     secondary to lithium toxicity.  Resolved. 6. Elevated BNP without clinical evidence of decompensated congestive     heart failure.  The patient's 2D echocardiogram performed on September 13, 2010, was very limited technically. 7. Normocytic anemia with a hemoglobin ranging from 9.7 to 10.7.  His     anemia panel revealed a total iron of 67, TIBC of 292, vitamin B12     of 238, folate of 5.8, and ferritin of 237. 8. Hyperlipidemia.  The patient's fasting lipid profile revealed a     total cholesterol of 138, triglycerides of 263, HDL cholesterol of     38, and LDL cholesterol of 47. 9. History of presumed chronic right heart failure. 10.Hyponatremia and hypokalemia, both repleted. 11.Bipolar disorder, depression,  and anxiety disorder, relatively     stable. 12.History of irritable bowel syndrome with chronically loose stools. 13.Obstructive sleep apnea, the patient refused CPAP in the hospital. 14.Morbid obesity. 15.Deconditioning. 16.Chief complaint of shortness of breath on admission, but no     evidence of decompensated congestive heart failure or     cardiac ischemia, and his D-dimer was within normal limits. 17.Hypothyroidism.  The patient's TSH was 0.98 and free T4 was 0.543,     both within normal limits.  MEDICATIONS: 1. Lasix was changed from 80 mg b.i.d. to 40 mg b.i.d. 2. Lithium carbonate, the dose was decreased from 600 mg b.i.d. to 300     mg b.i.d. 3. Spironolactone, the dose was decreased from 100 mg daily to 50 mg     daily. 4. Topamax, the dose was decreased from 100 mg at bedtime to 50 mg at     bedtime. 5. Venlafaxine 75 mg b.i.d., decreased from 75 mg t.i.d. 6. Albuterol inhaler 1 puff every 4 hours as needed for asthma. 7.  Alprazolam 1 mg 4 times daily. 8. Celebrex 100 mg b.i.d. 9. Glucosamine sulfate 1 capsule daily. 10.Hydrocodone/APAP 10/325 mg 4 times daily as needed for pain. 11.Lisinopril 5 mg daily. 12.Morphine sulfate CR 60 mg b.i.d. as needed for severe pain. 13.Nexium 40 mg daily. 14.Simvastatin 40 mg at bedtime. 15.Synthroid 100 mcg daily. 16.Stop metoclopramide. 17.Stop ginkgo biloba. 18.Stop amlodipine. 19.Stop clomipramine. 20.Stop dicyclomine. 21.Stop temazepam. 22.Stop Benadryl.  DISCHARGE DISPOSITION:  The patient was discharged home in improved and stable condition on September 16, 2010.  He will follow up with cardiologist, Dr. Rennis Golden, on Oct 09, 2010, at 11:30 a.m.  He will follow up with Dr. Lysbeth Galas as scheduled next week.  He was advised to make an appointment with a psychiatrist, Dr. Betti Cruz, at his next available appointment.  CONSULTATIONS:  Please see the previous detailed progress note summary on September 15, 2010.  HOSPITAL COURSE:  Please  see the dictated detailed progress note summary on September 15, 2010.  In essence, the patient is a 45 year old man who presented to the emergency department with a chief complaint of shortness of breath, recent increase in abdominal girth, recent increase in leg swelling, loose stools, and an increase in sleepiness.  In addition to polypharmacy, he was found to have lithium toxicity and acute renal failure.  He was treated appropriately as discussed in detail in the progress note summary from yesterday.  Most of his psychotropic medications were either discontinued or decreased.  He was advised to follow up with his psychiatrist, Dr. Betti Cruz, at his next available appointment.  The patient's lithium level improved.  His acute renal failure completely resolved.  He was evaluated by the physical therapist who believed he was back at baseline.  All of his cardiac arrhythmias which had been caused by lithium toxicity resolved.  His neurological symptoms resolved as well and neurologically, he was back to baseline according to his wife.  DISCHARGE LABORATORY DATA:  Sodium 138, potassium 3.8, chloride 108, CO2 24, glucose 111, BUN 1, creatinine 0.79, calcium 8.9, ammonia 60. Lithium level 0.66.  WBC 9.5, hemoglobin 11.2, platelet count 365.     Elliot Cousin, M.D.     DF/MEDQ  D:  09/16/2010  T:  09/17/2010  Job:  [id number]  cc:   Italy Hilty, MD  Delaney Meigs, M.D. Fax: [phone]  Daine Floras, M.D. Fax: [phone]  Electronically Signed by Elliot Cousin M.D. on 09/17/2010 09:07:09 AM

## 2010-09-17 NOTE — Group Therapy Note (Signed)
NAME:  [name], [name]               ACCOUNT NO.:  [id number]  MEDICAL RECORD NO.:  [id number]           PATIENT TYPE:  I  LOCATION:  A316                          FACILITY:  [facility]  PHYSICIAN:  Elliot Cousin, M.D.    DATE OF BIRTH:  [birth year]  DATE OF PROCEDURE: DATE OF DISCHARGE:                                PROGRESS NOTE   CURRENT DIAGNOSES AND PROBLEM LIST: 1. Delirium secondary to lithium toxicity with a contribution from     multiple psychotropic medications and elevated ammonia level.  The     patient's lithium level on admission was 2.28 (0.80-1.40 normal     range). 2. Dysarthria, ptosis, and tremor secondary to lithium toxicity. 3. Acute renal failure, nonoliguric.  The patient's BUN was 24 and his     creatinine was 2.71 on admission. 4. Elevated ammonia level without evidence of hepatic dysfunction.     The patient's initial ammonia level was 59. 5. Cardiac arrhythmia including paroxysmal atrial fibrillation,     junctional pacemaker, and prolonged QT interval, secondary to     lithium toxicity. 6. Elevated BNP without clinical evidence of decompensated congestive     heart failure.  The 2-D echocardiogram performed on September 13, 2010     was very limited technically. 7. Normocytic anemia with a hemoglobin ranging from 9.7-10.7.  His     anemia panel revealed a total iron of 67, TIBC of 292, vitamin B12     of 238, folate of 5.8, and ferritin of 237. 8. Hyperlipidemia.  The patient's fasting lipid profile reveals a     total cholesterol of 138, triglycerides of 263, HDL cholesterol of     38, and LDL cholesterol of 47. 9. History of presumed chronic right heart failure. 10.Hyponatremia and hypokalemia, both repleted. 11.Bipolar disorder, depression, and anxiety disorder, relatively     stable. 12.History of irritable bowel syndrome with chronically loose stools. 13.Obstructive sleep apnea, the patient refuses CPAP in the hospital. 14.Morbid  obesity. 15.Deconditioning.  PROCEDURE PERFORMED: 1. A 2-D echocardiogram on September 13, 2010.  The results were very     limited and no EF was obtained.  No gross valvular abnormalities     were seen.  Left atrium was mildly moderately dilated.  Right     ventricle size was mildly dilated. 2. Chest x-ray on September 14, 2010.  The results revealed no active     disease. 3. Renal ultrasound on September 13, 2010.  The results revealed no acute     findings.  Negative for hydronephrosis. 4. MRI of the brain on September 13, 2010.  The results revealed normal     MRI of the brain. 5. CT scan of the head without contrast on September 12, 2010.  The     results revealed mild generalized atrophy, questionable low     attenuation within the right cerebellar hemisphere, artifact versus     infarct, and no other intracranial abnormalities.  CONSULTATIONS:  None.  SUBJECTIVE:  The patient is currently asleep but arousable.  He is oriented to year and place.  He has no complaints of chest  pain, shortness of breath, abdominal pain, or headaches.  His wife is in the room.  She says that he is becoming more alert and interactive.  OBJECTIVE:  VITAL SIGNS:  Temperature 98.2, pulse 81, respiratory rate 20, blood pressure 124/73.  Oxygen saturation 99% on room air. GENERAL:  The patient is a morbidly obese 45 year old Caucasian man who is currently lying in bed in no acute distress. HEENT:  Head is normocephalic, nontraumatic.  Pupils are equal, round, and reactive to light.  Extraocular muscles are intact.  Decreased bilateral ptosis.  Oropharynx reveals moist mucous membranes. LUNGS:  Clear to auscultation bilaterally. HEART:  S1, S2 with no murmurs, rubs, or gallops. ABDOMEN:  Morbidly obese, positive bowel sounds, soft, nontender, nondistended.  No hepatosplenomegaly.  No masses palpated. GU AND RECTAL:  Deferred. EXTREMITIES:  Pedal pulses are palpable bilaterally.  No pretibial edema.  No pedal  edema. NEUROLOGIC:  The patient is alert and oriented to place, year, and wife. He is more alert and interactive.  Left ptosis.  No obvious tremor seen today.  Cranial nerves II-XII are intact.  LABORATORY DATA:  Lithium level 0.96, BNP 436, ammonia 78.  Sodium 136, potassium 3.9, chloride 103, CO2 24, glucose 117, BUN 2, creatinine 0.75, calcium 8.9, free T4 0.98, TSH 0.54, hepatic function panel with total bilirubin of 0.2, alkaline phosphatase 76, SGOT 12, SGPT 10, total protein 6.6, albumin 3.4.  ASSESSMENT: 1. Lethargy, delirium, tremor, and dysarthria, secondary to lithium     toxicity.  The patient is more alert.  There are fewer     neurological findings today.  He is somewhat deconditioned and will     likely need some physical therapy. 2. Acute renal failure.  The patient's renal function is approaching     baseline.  His I/O's were quite negative over the past 24     hours, although he did receive 40 mg of Lasix IV yesterday due to     an increase in bronchospasms.  His chest x-ray was proven to be     clear with no evidence of edema or infiltrates.  I am concerned     about post renal failure diuresis which is possible in the setting     of lithium toxicity. 3. Chronic right heart failure.  The patient had been treated with     large doses of Lasix prior to this hospitalization which may have     led to susceptibility for acute renal failure.  Other than the IV     Lasix he received yesterday, he has not been on diuretic therapy.     His chest x-ray is clear.  He has no obvious     peripheral edema.  His BNP is elevated.  However, clinically there     is no evidence of decompensated congestive heart failure. 4. Elevated ammonia level.  The patient's ammonia level is 78.  He was     started on lactulose a few days ago.  He has chronically loose     stools and therefore the dose of lactulose will not be increased     yet. 5. Cardiac arrhythmias.  The patient has had a  combination of     bradycardia, paroxysmal atrial fibrillation, junctional rhythm, and     prolonged QT interval, all attributable to lithium toxicity.  His     rhythm has finally converted to normal sinus rhythm today.  The     prolonged QT and bradycardia have resolved.  He  was started on     aspirin at 81 mg empirically.  His rate has been controlled without     tachy arrhythmias.  Cardiac enzymes are within normal limits.  His     thyroid function is within normal limits as well.  His fasting     lipid profile reveals hypertriglyceridemia.  He is being     maintained on Zocor. 6. Normocytic anemia.  The patient's hemoglobin has been ranging from     9.7-10.7.  His anemia panel was essentially normal.  His anemia may     be secondary to medication side effect.  He is being maintained on     proton pump inhibitor therapy with Protonix, although he takes     Nexium at home.  He had a normal colonoscopy with an exception of     hemorrhoids in June 2007.  He also has a history of antral erythema     per the EGD in June 2007 by Dr. Karilyn Cota. 7. Bipolar disorder, anxiety disorder, and depression.  All of his     psychotropic medications were initially withheld with the exception     of venlafaxine and Xanax which were initially started at half their     doses.  The dose of venlafaxine has been increased to b.i.d. and     the dose of Xanax has been increased to 1 mg 4 times daily which is     his usual dose.  Lithium will be restarted at half his usual dose     tomorrow morning.  Topiramate, clomipramine, temazepam will remain     discontinued for now. 8. Chronic pain syndrome.  The patient is being maintained on half of     his usual dose of MS Contin at 30 mg q.12 h. (he usually takes 60     mg q.12 h.),  p.r.n. hydrocodone has been continued. 9. Subjective shortness of breath as reported at the time of     admission.  The patient was assessed for shortness of breath but     was found to  have no obvious source other than lithium toxicity.     There was no evidence of decompensated congestive heart failure.     There was no obvious pulmonary edema on the chest x-ray and no     obvious peripheral or whole-body edema on admission.  His D-dimer     was within normal limits and therefore the V/Q scan was cancelled.  PLAN: 1. We will restart lithium at 300 mg b.i.d. which is half of his usual     dose. 2. We will consult the physical therapist for evaluation and     recommendations. 3. We will continue gentle IV fluids.  The rate will be decreased to     70 mL an hour.  We will monitor his renal function closely.  There     is concern about post acute renal failure diuresis and therefore     Lasix will be withheld for now.     Elliot Cousin, M.D.     DF/MEDQ  D:  09/15/2010  T:  09/15/2010  Job:  [id number]  Electronically Signed by Elliot Cousin M.D. on 09/17/2010 09:12:24 AM

## 2010-10-15 NOTE — Procedures (Signed)
NAME:  [name], [name]               ACCOUNT NO.:  [id number]   MEDICAL RECORD NO.:  [id number]           PATIENT TYPE:   LOCATION:                                 FACILITY:   PHYSICIAN:  Erick Colace, M.D.DATE OF BIRTH:  [birth year]   DATE OF PROCEDURE:  09/20/2007  DATE OF DISCHARGE:                               OPERATIVE REPORT   PROCEDURE:  Bilateral L5 dorsal ramus injection, bilateral L4 medial  branch blocks, bilateral L3 medial branch blocks, under fluoroscopic  guidance.   INDICATIONS:  Lumbar facet-mediated pain, lumbosacral spondylosis noted  on MRI scan dated July 30, 2007.  Pain is only partially response to  medication management.   Informed consent was obtained after describing the risks and benefits  procedure the patient.  These include bleeding, bruising, and infection.  He elects to proceed and has given written consent.  The patient was  placed prone on the fluoroscopy table.  Betadine prep, sterile drape.  A  25-gauge, 1-1/2 inch needle was used to anesthetize the skin and  subcutaneous tissue, 1% lidocaine x2 mL at each site.  Then, a 22-gauge,  5-inche spinal needle was inserted under fluoroscopic guidance,  targeting the left S1 SAP sacral ala junction.  Bone contact made,  confirmed with lateral imaging.  Omnipaque 180 x 0.5 mL demonstrated no  intravascular uptake at 0.5 mL.  The dexamethasone/lidocaine solution  was injected, dexamethasone/lidocaine solution containing 1 mL of 4  mg/mL dexamethasone and 2 mL of 2% MPF lidocaine.  Then, the left L5 SAP  transverse process junction was targeted.  Bone contact made, confirmed  with lateral imaging.  Omnipaque 180 x 0.5 mL demonstrated no  intravascular uptake.  Then, 0.5 mL dexamethasone/lidocaine solution was  injected.  Then, the left L4 SAP transverse process junction was  targeted.  Bone contact made, confirmed with lateral imaging.  Omnipaque  180 x 0.5 mL demonstrated no intravascular uptake.   Then,  0.5 mL of  dexamethasone/lidocaine solution was injected.  The same procedure was  repeated on corresponding levels on the right side using same needle,  injectate, and technique.  The patient tolerated procedure well.  Pre-  and postinjection vitals were stable.  Postinjection instructions were  given. Preinjection pain 8/10, postinjection 5/10.  Will give pain diary  and see back in 3-4 weeks for possible reinjection at that time versus  SI.      Erick Colace, M.D.  Electronically Signed     AEK/MEDQ  D:  09/20/2007 15:35:29  T:  09/20/2007 17:05:17  Job:  [id number]

## 2010-10-15 NOTE — Assessment & Plan Note (Signed)
[name] is back regarding his chronic low back pain.  BM and Opana just  over 2 months ago and that seems to have helped his pain quite a bit.  He still having some symptoms in the low back that they are much  improved.  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  He rates his pain is 7-9/10, described as dull, aching, and constant.  Pain interferes with general activity, relations with others, and  enjoyment of life on a moderate-to-severe level.  Sleep is poor.  His  Oswestry score today is 58%.   The patient is using Norco for breakthrough pain.  Additionally, he is  trying some gentle stretching and some exercise around the home, but it  is very limited still at this point.   REVIEW OF SYSTEMS:  Notable for bowel and bladder control issues,  tremor, trouble walking, spasms, dizziness, confusion, depression and  night sweats, weight gain, and high sugars.  He has reported prior  suicidal thoughts, but is doing better in that category overall.   SOCIAL HISTORY:  The patient married and lives with his wife, he remains  supportive.   PHYSICAL EXAMINATION:  VITAL SIGNS:  Blood pressure is 154/88, pulse  105, respiratory rate 20, and sating 98% on room air.  GENERAL:  The patient is pleasant, alert, and oriented x3.  Affect is  much brighter than I have ever seen it before.  He was joking, laughing,  making I contact frequently as well.  He is able to nearly touch his  toes today and bending.  Extension caused some pain.  He had some  tenderness over the paravertebral area at the L3-L5.  Facet maneuvers I  felt were equivocal.  Strength was near 5/5.  Reflexes are 1+.  Sensory  findings were normal.  Remains morbidly overweight.  HEART:  Regular.  CHEST:  Clear.  ABDOMEN:  Soft and nontender.   ASSESSMENT:  1. Chronic low back pain which remains multifactorial  related to      sacroiliac joint dysfunction, facet arthropathy, spondylosis, etc.  2. Morbid obesity.  3. Severe depression with obsessive-compulsive disorder.  4. Irritable bowel syndrome.   PLAN:  1. Increase Opana ER 20 mg q.12 h.  We discussed appropriate bowel      regimen and side effects.  He seems to be tolerating it very well      at this point.  2. We will continue Norco 10/325 for breakthrough pain one q.8 h.      p.r.n., #90.  3. I will send to [facility] [place], outpatient PT for low back,      lower extremity core muscle, pelvic strengthening, range of motion      etc.  Modalities also will be used as indicated as well as TENS      unit and I would like to get him started on a solid home exercise      program.  4. Discussed weight loss.  5. Check testosterone level today.  He continue with 2.5 Androderm      patch for now.  6. I will see him back in about 2 months with 56-month nursing      followup.      Ranelle Oyster, M.D.  Electronically Signed     ZTS/MedQ  D:  07/26/2008 13:31:22  T:  07/27/2008 01:45:09  Job #:  [id number]   cc:   Delaney Meigs, M.D.  Fax: [phone]

## 2010-10-15 NOTE — Procedures (Signed)
NAMEKEYRON, POKORSKI               ACCOUNT NO.:  [id number]   MEDICAL RECORD NO.:  [id number]          PATIENT TYPE:  REC   LOCATION:  TPC                          FACILITY:  [facility]   PHYSICIAN:  Erick Colace, M.D.DATE OF BIRTH:  01/20/1966   DATE OF PROCEDURE:  DATE OF DISCHARGE:                               OPERATIVE REPORT   PROCEDURE:  Left paramedian translaminar L4-5 lumbar epidural steroid  injection under fluoroscopic guidance.   INDICATIONS FOR PROCEDURE:  Lumbar L4-5 disk with back and bilateral  buttock and hip pain.   Informed consent was obtained.   Pain is only partially responsive to narcotic analgesic medications and  other conservative care.  The patient placed prone on fluoroscopy table.  Betadine prep, sterile drape. 25-gauge 1-1/2-inch needle was used to  anesthetize the skin and subcu tissue with 1% lidocaine x2 mL.  An 36-  gauge [place] needle was inserted under fluoroscopic guidance, targeting  inferior lamina of L4, left paramedian approach.  Bone contact made.  Needle redirected inferiorly, and under lateral imaging and loss-of-  resistance technique, positive loss was obtained.  Then a solution  containing 2 mL of 40 mg/mL of Depo-Medrol and 2 mL of 1% preservative-  free lidocaine were injected.  The patient tolerated procedure well.  Preinjection pain level 8/10, postinjection 5/10.  Return in 3-4 weeks  for repeat injection.      Erick Colace, M.D.  Electronically Signed     AEK/MEDQ  D:  06/24/2007 13:50:45  T:  06/24/2007 14:13:18  Job:  [id number]

## 2010-10-15 NOTE — Assessment & Plan Note (Signed)
[name] is back regarding his back pain.  I had him see Dr. Wynn Banker in  January for a lumbar left paramedian trans-laminar injection at L4-L5.  He really experienced no benefit from the procedure.  Due to his lack of  benefit, we scheduled an MRI of his lumbar spine in February, which  showed a left paracentral protrusion at L4-L5 and multi-level facet  degenerative changes which were mild to moderate in severity.  The  patient states that he has pain in his lower back with pain to the left  leg.  The pain is more severe with standing and walking.  He is also  stiff when he rises from a seated to standing position.   He rates his pain as an 8-9 over 10.  He finds Lidoderm patches are  helpful somewhat.  We did not use antiinflammatories due to his GI  history.  I did give him a Medrol dose pack which was beneficial but he  had some GI upset with that as well.  Sleep is poor and his mobility and  activity are minimal still at this point.  Cognitively, he has been  stable.   REVIEW OF SYSTEMS:  Notable for trouble with spasms, tingling,  depression, anxiety, night sweats, high sugars, abdominal pain,  respiratory infection, shortness of breath.  Other pertinent positives  listed above and full review is in the written health and history  section.   SOCIAL HISTORY:  The patient is married and wife is here with him and  supportive.   PHYSICAL EXAMINATION:  VITAL SIGNS:  Blood pressure is 138/87, pulse is  94, respiratory rate is 16.  He is sating 95% on room air.  GENERAL:  The patient is pleasant.  He is morbidly overweight.  HEART:  Regular.  CHEST:  Clear.  ABDOMEN:  Soft, nontender.  MUSCULOSKELETAL:  Mobility was fair as he walks with a wide-based gait  and is rigid in posture.  He actually bent forward and nearly touched  his toes surprisingly for me today with some discomfort.  This appeared  improved over last visit.  Extension was painful as well as facet  maneuvers either  side.  He had pain with palpation over the lumbar  paraspinals and facets bilaterally.  Strength was 5/5 with normal  sensation in both legs.  Reflexes were 2 plus in both legs.  Upper  extremities movement and strength within normal limits.   ASSESSMENT:  1. Ongoing low back pain with a radiographic image of disk disease and      facet arthropathy.  Exam today appears to be more consistent with      facet disease.  2. Morbid obesity.  3. Several year depression and anxiety with obsessive/compulsive      disorder.  4. History of irritable bowel syndrome.   PLAN:  1. We will set the patient up for medial branch blocks bilaterally at      L3-L4, L4-L5, L5-S1.  2. Continue Lidoderm patches for local pain relief.  3. The patient was given Ultram 50 mg q.6 h. for pain control.  I      encouraged the use of q.12 h. initially to make sure he is tolerant      of it.  4. After injections and potential lumbar RF's, I would like to get the      patient involved in physical therapy work on      posture, range of motion, etcetera.  He needs to focus on weight  loss as well.  5. I will see the patient back pending injections with Dr. Wynn Banker.      Ranelle Oyster, M.D.  Electronically Signed     ZTS/MedQ  D:  09/03/2007 16:51:48  T:  09/03/2007 18:25:23  Job #:  [id number]   cc:   Delaney Meigs, M.D.  Fax: [phone]

## 2010-10-15 NOTE — Assessment & Plan Note (Signed)
[name] is back regarding his low back pain.  He has had no almost  alternately with either epidural steroid injections, medial branch  blocks or SI joint injections.  He states that pain is more frequent now  and states that the only relief he gets is when is lying on his back.  Pain did not respond to fentanyl and he stopped the patch.  He uses the  Norco for breakthrough pain.  He has not followed up with Dr. Betti Cruz  regarding his mood.  The patient rates his pain 8/10.  Describes it as  constant and aching.  Pain interferes with his general activity,  relationships with others, and enjoyment of life on a severe level.  He  is sleeping about 4-5 hours at night.  He can walk about 10 minutes  without having to stop.  He does want to lose weight and is trying to  work on that.   REVIEW OF SYSTEMS:  Notable for trouble walking, depression, anxiety,  and suicidal thoughts. Other pertinent positives are as above and full  review is in the written health and history section on the chart.   SOCIAL HISTORY:  The patient is married and lives with his spouse who is  supportive.   PHYSICAL EXAMINATION:  VITAL SIGNS: Blood pressure is 121/75, pulse is  89, respiratory rate is 18, and he is sating 96% on room air.  GENERAL:  The patient seems to be heavier since I last saw him.  MUSCULOSKELETAL:  He is able to bend to about 40 degrees of flexion of  the lumbar spine.  Extension was 10 degrees.  He had pain more with  flexion and extension today.  He had pain with palpation over the lumbar  paraspinals at L3, L4, and L5.  Facet maneuvers were equivocal although  positive today.  [name] test was positive.  Strength is 5/5 with 1+  reflexes in both legs and no sensory findings.  HEART:  Regular.  CHEST:  Clear.  ABDOMEN:  Soft and nontender.  NEUROLOGIC:  The patient's standing posture is poor and is slightly  flexed position.   ASSESSMENT:  1. Chronic low back pain which is multifactorial.  He  has not      responded to any specific interventional treatments.  2. Morbid obesity.  3. Severe depression with obsessive-compulsive disorder.  4. Irritable bowel syndrome.   PLAN:  1. The patient needs psychiatric follow up with Dr. Betti Cruz.  2. We will try in place of fentanyl, Opana ER 10 mg q.12 h.  We      discussed the effects and the side effects of the medications      today.  3. After an informed consent we injected the L3-L4, and L4-L5      paraspinals with 2 separate trigger point injections consisting of      2 ml of 1% lidocaine.  The patient tolerated well.  4. Norco for breakthrough pain 10/325 mg 1 q.8 h. p.r.n. # 90.  5. Checked testosterone levels as well as thyroid function panel today      as these could be impacting his scenario of      poor mood and chronic pain.  6. We will see him back in about a months' time.      Ranelle Oyster, M.D.  Electronically Signed     ZTS/MedQ  D:  05/09/2008 13:17:46  T:  05/10/2008 01:28:03  Job #:  [id number]   cc:   Darcel Bayley  Beatris Si, M.D.  Fax: [phone]

## 2010-10-15 NOTE — Assessment & Plan Note (Signed)
[name] is back regarding his chronic pain.  He states that his knees  have been flaring up recently particularly over the last several months  with walking.  He has been trying to increase his walking activity and  notes that knees seem to be affected accordingly.  He knows he has a  long history of knee pain and he is to be a runner back in his younger  days.  He is rating his pain as 7/10.  He also has questions about his  Opana ER, which he can no longer afford as he is in the donor hold his  insurance plan.  He continues on his testosterone patches as ordered.  He is due for a testosterone level today.   The patient states pain interferes with general activity, relations with  others, enjoyment of life on a moderate-to-severe level.  Sleep is poor.   REVIEW OF SYSTEMS:  Notable for bladder control and bowel control.  He  has trouble walking, depression, and anxiety.  Other pertinent positives  are above.   SOCIAL HISTORY:  The patient is living with his wife, and is with him  today, and remains supportive.   PHYSICAL EXAMINATION:  VITAL SIGNS:  Blood pressure is 140/79, pulse is  89, and respiratory rate 18.  He is sating 94% on room air.  GENERAL:  The patient is pleasant, alert, and oriented x3.  EXTREMITIES:  Some antalgia on either leg.  He has no obvious varus or  valgus deformities of either knee.  He has mild crepitus with range of  motion in flexion and extension.  Meniscal maneuvers were mildly  provocative.  No obvious swelling was seen in the knees, although he had  generalized swelling at trace to 1+ over both lower extremities.  Back  is still somewhat tender to touch in the lower lumbar segments and  painful with flexion and extension.  Limited in rotation and lateral  bending on each side as well.  Strength is generally 5/5 with 1+  reflexes throughout.  No focal sensory findings still.  Weight is not  changed.  His affect and mood is much improved, and he is  definitely  brighter today.   ASSESSMENT:  1. 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  2. Morbid obesity.  3. Depression with obsessive-compulsive disorder.  4. Irritable bowel syndrome.  5. Testosterone deficiency.  6. Bilateral osteoarthritis of the knees.   PLAN:  1. Recommended continued aerobic exercise, but I asked the patient to      try to vary up his activity include biking, water activities, etc.      He also needs to work on weight loss.  He has been enrolled in [facility] apparently, therefore this will help him on these      lines.  Certainly, his weight loss will help with his knee pain.  2. Recommended over-the-counter glucosamine for now to start.  3. We will switch him from Opana to MS Contin 30 mg 1 q.12 h. p.r.n.,      # 60.  I also refilled his Norco 10/325 1 q.8 h. p.r.n., #90.  4. Refill the Androderm patch 5 g 1 patch per day.  The testosterone      level was drawn today.  5. I will see him back in about 2 months with 29-month nursing      followup.  Ranelle Oyster, M.D.  Electronically Signed     ZTS/MedQ  D:  11/29/2008 11:13:18  T:  11/30/2008 01:47:27  Job #:  [id number]   cc:   Delaney Meigs, M.D.  Fax: [phone]

## 2010-10-15 NOTE — Assessment & Plan Note (Signed)
[name] is back regarding his chronic low back and knee pain.  We had  switched him over from the Opana and MS Contin last month due to  financial constraints.  He notes that it is not controlling his pain as  well.  He does not fill any gaps per se in his pain control, but overall  the coverage is not as great.  He rates his pain 8/10.  The glucosamine  has been beneficial for his knees however.  He has lost perhaps about 15  pounds since I last saw him, but eating better and trying to exercise a  bit more.  Sleep is poor at times.  Mood is bit better lately.   REVIEW OF SYSTEMS:  Notable for the above.  Full 14-point review is in  the written health and history section of the chart.   SOCIAL HISTORY:  Unchanged.  He lives with his wife.  She is helping him  at times with his bathing due to the inability to reach his feet and  back.   PHYSICAL EXAMINATION:  VITAL SIGNS:  Blood pressure is 123/69, pulse is  81, respiratory rate 18, he is sating 94% on room air.  GENERAL:  The patient is pleasant, alert, and oriented x3.  He remains  morbidly obese.  It appears that he has lost some weight.  He has a wide-  based gait with some mild antalgia, but much improved.  He transfers  much easier today.  MUSCULOSKELETAL:  Back is limited with extension and flexion today.  He  has some generalized tenderness in the paraspinal to the lumbar spine  today.  Strength is 5/5 with 1+ reflexes and normal sensation.  Affect  and mood were great.  Cognition was normal.   ASSESSMENT:  1. 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  2. Morbid obesity.  3. Obsessive-compulsive disorder and depression.  4. Irritable bowel syndrome.  5. Testosterone deficiency.  6. Bilateral osteoarthritis of knees.   PLAN:  1. We will increase his MS Contin to 60 mg q.12 h. #60.  I asked him      to see if he can reduce his Norco 10s to 1 or 2 a day.  I gave  him      90 pills and refill today.  2. Continue weight loss and exercise regimen.  3. Continue glucosamine.  4. Check testosterone level in 1 month at nursing clinic.  I will see      him back in approximately 3 months' time.      Ranelle Oyster, M.D.  Electronically Signed     ZTS/MedQ  D:  01/26/2009 10:03:15  T:  01/27/2009 00:07:49  Job #:  [id number]   cc:   Delaney Meigs, M.D.  Fax: [phone]

## 2010-10-15 NOTE — Procedures (Signed)
NAME:  [name], [name]               ACCOUNT NO.:  [id number]   MEDICAL RECORD NO.:  [id number]          PATIENT TYPE:  REC   LOCATION:  [facility]                          FACILITY:  [facility]   PHYSICIAN:  Erick Colace, M.D.DATE OF BIRTH:  08/24/1965   DATE OF PROCEDURE:  DATE OF DISCHARGE:                               OPERATIVE REPORT   PROCEDURES:  Bilateral L5 dorsal ramus injection, bilateral L4 medial  branch block, and bilateral L3 medial branch block under fluoroscopic  guidance.   INDICATIONS:  Lumbar spondylosis without myelopathy.  Pain is only  partially responsive to medication management interferes with walking  and standing activities.  The pain is only partially responsive to  medication management including narcotic analgesics.  He did have a  prior medial branch block done 1-2 months ago, which resulted in  approximately 13-month reduction of pain by 50%, these are confirmatory.   Informed consent was obtained after describing risks and benefits of the  procedure with the patient.  These include bleeding, bruising,  infection, temporary or permanent paralysis.  He elected to proceed and  has given written consent. The patient was placed prone on the  fluoroscopy table.  Betadine prep, sterilely draped.  A 25-gauge, 1-1/2-  inch needle was used to anesthetize the skin and subcu tissue, 1%  lidocaine x2 cc, and 22-gauge, 3-1/2-inch spinal needle was inserted  under fluoroscopic guidance, starting first at the left S1 SAP sacral  ala junction.  Bone contact was made and confirmed with lateral imaging.  Omnipaque 180 x0.5 mL demonstrated no intravascular uptake, then 0.5 mL  of solution containing 1 mL of 4 mg/mL dexamethasone and 2 mL of 2% MPF  lidocaine were injected, then the L5 SAP transverse process junction was  targeted.  Bone contact was made and confirmed with lateral imaging.  Omnipaque 180 x0.5 mL demonstrated no intravascular uptake, then 0.5 mL  of  dexamethasone lidocaine solution was injected, and the left L4 SAP  transverse process junction targeted.  Bone contact was made and  confirmed with lateral imaging.  Omnipaque 180 x0.5 mL demonstrated no  intravascular uptake, then 0.5 mL the dexamethasone lidocaine solution  was injected.  The patient tolerated the procedure well.  The  corresponding levels on the right side were injected using same needle  injecting and technique.  The patient tolerated the procedure well.  Pre  and postinjection vitals stable.  Post-injection instructions given.  Please refer to procedure form for pre and post injection pain scores.      Erick Colace, M.D.  Electronically Signed     AEK/MEDQ  D:  11/22/2007 14:28:48  T:  11/23/2007 16:10:96  Job:  [id number]

## 2010-10-15 NOTE — Assessment & Plan Note (Signed)
[name] is back regarding his chronic low back pain.  His last visit  in our office was in June, where Dr. Wynn Banker performed bilateral L5,  L4, and L3 medial branch blocks under fluoroscopic guidance.  The  patient noticed only hours of relief with these injections despite  steroids and anesthetic being used for the procedure.  He has had  worsening depression, and he states that is why he has missed  appointments over the last few months.  He is followed by Dr. Betti Cruz, who  recently placed him on Celexa and Abilify, and stopped his Emsam,  lithium, as well as Valium.  He still takes the Xanax and temazepam at  night for sleep.  He rates his pain a 7/10 and feels very limited  especially when he stands with his low back pain.  He has minimal pain  radiating down the legs.  He has a hard time performing self-care tasks  such as shaving and showering.  He is limited with walking about 10  minutes before he has to stop when his back locks up.  Pain interferes  with general activity, in relationship with others, and enjoyment of  life on a moderate level.  The patient's pain worsens with activities  and easily improves with medications.  He still uses some Norco for  breakthrough symptoms, which help to a certain extend.  He is on 10/325  one every 8 hours p.r.n.   REVIEW OF SYSTEMS:  Notable for depression, anxiety, suicidal thoughts,  trouble walking, spasms, and weakness.  Full review is in the written  health and history section above.   SOCIAL HISTORY:  The patient is married, lives with his wife.   PHYSICAL EXAMINATION:  GENERAL:  The patient is pleasant, very flat  affect.  He has gained what appears to be more weight since I last saw  him.  He is limited with flexion to about 40 degrees.  He has minimal  extension today of 5-10 degrees with pain, provoked.  He seemed to have  pain with facet maneuvers once again as well.  I laid him on the table  and he had bilateral positive  Patrick maneuver, right much more than  left.  He felt that the SI maneuvers seemed to more replicate his pain  than anything we provoked on exam today.  His strength is generally 5/5  in both legs with 1+ to 2+ reflexes and no focal sensory findings.  HEART:  Regular.  CHEST:  Clear.  ABDOMEN:  Soft and nontender.   ASSESSMENT:  1. Chronic low back pain, which by exam and radiographic evidence,      appears to be partially facet mediated.  However, the patient has      not responded to facet interventions.  Pain generally could be      partially sacroiliac joint in nature as well concerning his exam      today and lack of response to the above.  2. Morbid obesity.  3. Severe depression and anxiety with obsessive-compulsive disorder.  4. History of irritable bowel syndrome.   PLAN:  1. We will try bilateral sacroiliac joints.  2. Psychiatric management per Dr. Betti Cruz.  3. We will start low dose fentanyl patch 25 mcg q.72 hours.  4. Continue Norco for breakthrough pain.  5. Need to look at testosterone levels, as these have been done      already.  6. I will see him back, pending the injections above.  Ranelle Oyster, M.D.  Electronically Signed     ZTS/MedQ  D:  03/20/2008 16:47:16  T:  03/21/2008 01:39:39  Job #:  [id number]   cc:   Delaney Meigs, M.D.  Fax: [phone]

## 2010-10-15 NOTE — Assessment & Plan Note (Signed)
[name] is back regarding his chronic low back pain.  [name] has been  doing fairly well since I last saw him with Opana.  He is having no ill  or side effects.  He did make it to physical therapy due to some issues  with lightheadedness and dizziness which he was treated for.  Apparently, no obvious source was found.  Pain has been dull and aching.  He is working on a diet.  He is only walking 5 minutes at a time due to  exercise tolerance issues.  He states this pain interferes with general  activity, relations with others, enjoyment of life on a moderate level.   REVIEW OF SYSTEMS:  Notable for depression.  Other pertinent positives  are above, and full review is in the written history section of the  chart.   SOCIAL HISTORY:  The patient is living with his wife who is supportive.  He is unemployed.   PHYSICAL EXAMINATION:  Blood pressure is 147/68, pulse is 104,  respiratory rate 20, sating 94% on room air.  The patient is pleasant,  alert and oriented x3.  Affect is bright and appropriate once again.  Gait is generally stable but slightly wide based.  Back is generally  tender to touch in lower lumbar paraspinal segments.  He had pain with  flexion and extension today.  He has some tenderness also with lateral  bending and rotation.  Strength is 5/5 in all four with 1-2+ reflexes  and no focal sensory findings today.  He remains morbidly overweight.  Heart was tachycardiac.  Chest is clear.  Abdomen is soft, nontender.   ASSESSMENT:  1. 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  2. Morbid obesity.  3. Depression with obsessive compulsive disorder.  4. Irritable bowel syndrome.  5. Testosterone deficiency.   PLAN:  1. I want to revisit physical therapy to increase range of motion,      lumbar and lower extremity strength, posture, etc.  It is a      paramount that he increases his activity and range  of motion as      well as work on weight loss.  2. Continue Opana ER 20 q.12 hours with Norco 10 mg q.8 hours p.r.n.      #60 and 90, respectively.  3. Recheck testosterone level in another 2-3 months.  He will stay at      the 5 mg Androderm patch for now.  4. We will see him back in about 2 months with nursing followup in 1      month.      Ranelle Oyster, M.D.  Electronically Signed     ZTS/MedQ  D:  09/22/2008 10:14:25  T:  09/22/2008 22:14:15  Job #:  [id number]   cc:   Delaney Meigs, M.D.  Fax: [phone]

## 2010-10-15 NOTE — Procedures (Signed)
NAMEVANDELL, [name]               ACCOUNT NO.:  [id number]   MEDICAL RECORD NO.:  [id number]           PATIENT TYPE:   LOCATION:                                 FACILITY:   PHYSICIAN:  Erick Colace, M.D.DATE OF BIRTH:  [birth year]   DATE OF PROCEDURE:  DATE OF DISCHARGE:                               OPERATIVE REPORT   PROCEDURE:  Bilateral sacroiliac injection.   INDICATION:  Pain in the sacroiliac area only partially relieved with  medication management.   Informed consent was obtained after describing risks and benefits of the  procedure with the patient.  These include bleeding, bruising, and  infection.  She elects to proceed and has given written consent.  The  patient placed prone on fluoroscopy table.  Betadine prep, sterile drape  25-gauge inch and a half needle was used to anesthetize the skin and  subcu tissue, 1% lidocaine x2 mL at each of 2 sites.  Then, a 25-gauge 3-  inch spinal needle was inserted under fluoroscopic guidance targeting  the right SI joint, AP, lateral, and oblique images utilized.  Omnipaque  180 under live fluoro demonstrate good arthrogram followed by injection  of 1 mL of 2% MPF lidocaine and 0.5 mL of 40 mg/mL Depo-Medrol.  The  same procedure was repeated on the left side using same needle injectate  and technique.  Pre injection pain level was 8/10.  Post injection was  4/10.      Erick Colace, M.D.  Electronically Signed     AEK/MEDQ  D:  08/14/2008 18:47:13  T:  08/15/2008 08:35:52  Job:  [id number]

## 2010-10-18 NOTE — Consult Note (Signed)
NAMEGREGORY, BARRICK               ACCOUNT NO.:  [id number]   MEDICAL RECORD NO.:  [id number]          PATIENT TYPE:   LOCATION:                                 FACILITY:   PHYSICIAN:  Lionel December, M.D.    DATE OF BIRTH:  1965-06-10   DATE OF CONSULTATION:  10/30/2005  DATE OF DISCHARGE:                                   CONSULTATION   REASON FOR CONSULTATION:  Abdominal pain, nausea, melena.   HISTORY OF PRESENT ILLNESS:  Mr. Quevedo is a 45 year old Caucasian male.  He  reports for the last three weeks he has had severe, constant mid abdominal  pain that he describes as spastic.  He rates the pain 8/10 on pain scale.  He complains of nausea without any emesis, severe headache as well.  Initially, he felt that he had a virus.  He had been seen in the emergency  room at [facility] on two occasions on May 22 and May 29.  He had  noticed bright red rectal bleeding on the toilet paper and on the stool with  two days of diarrhea, and since that time he has had daily diarrhea as well  as daily bleeding.  He denies any new medications.  He does have history of  chronic GERD which is well controlled on Nexium and Reglan.  He denies any  anorexia.  Does complain of some abdominal bloating.  Denies any dysphagia  or odynophagia.   While in the emergency room, he has had a fairly extensive workup.  He had a  bold, noncontrast CT of the abdomen which was normal.  He had an abdominal  ultrasound which showed a gallbladder poly, was otherwise negative.  He had  plain films of the abdomen which were negative as well.  He had laboratory  studies on Oct 24, 2005, which showed a normal white blood cell count 7.5,  hemoglobin 15, hematocrit 43.3, platelets 343,000.  Normal comprehensive  metabolic panel including LFT's, normal TSH and negative H.pylori.  On May  22, he also had low normal lipase and urinalysis which was negative other  than high specific gravity.   PAST MEDICAL HISTORY:  1.  Hypertension.  2.  Asthma.  3.  IBS.  4.  Hypercholesterolemia.  5.  Chronic GERD.  6.  Tonsillectomy.  7.  Deviated septum.   CURRENT MEDICATIONS:  1.  Xanax 1 mg q.i.d.  2.  __________ daily.  3.  Reglan 10 mg t.i.d.  4.  Geodon 80 mg daily.  5.  Luvox 100 mg t.i.d.  6.  Amlodipine 2.5 mg daily.  7.  Stomate 500 mg two p.o. q.h.s.  8.  Vicodin 5/500 mg t.i.d. p.r.n.   ALLERGIES:  ASPIRIN AND PENICILLIN.   FAMILY HISTORY:  No known history of  colorectal carcinoma or liver or  chronic GI problems.  Mother age 91 has history of hypertension,  hypercholesterolemia.  Father age 38 is healthy.  There is one healthy  sister.   SOCIAL HISTORY:  Mr. Wacker has been married for 22 years.  He is disabled  currently.  He has a 10-pack year history of tobacco use.  Consumes about 5-  6 beers 2-3 nights a week.  Denies any drug use.   REVIEW OF SYSTEMS:  He is complaining of some fatigue as well as some night  sweats.  Denies any fevers or chills.  CARDIOVASCULAR:  Denies any chest  pain or palpitation.  PULMONARY:  Does have some shortness of breath at rest  that has been worsened over the last two weeks.  Denies any cough or  hemoptysis.  He does have history of asthma.  CHEST:  See HPI.   PHYSICAL EXAMINATION:  VITAL SIGNS:  Weight 302.5 pounds, height 74 inches,  temperature 98.6, blood pressure 152/98, pulse 68.  GENERAL APPEARANCE:  Mr. Silversmith is a 45 year old, obese, Caucasian male who  is alert, pleasant, cooperative in no acute distress.  HEENT:  Sclerae are clear, nonicteric.  Conjunctivae are pink.  Oropharynx  pink and moist without any lesions.  NECK:  Supple without any mass or thyromegaly.  CHEST:  Heart regular rate and rhythm with normal S1, S2 without any  murmurs, clicks, rubs or gallops.  LUNGS:  Clear to auscultation bilaterally.  ABDOMEN:  Obese with positive bowel sounds x4, no bruits auscultated.  He  does have a scar to the epigastrium, a plus sign  shaped scar to the  epigastrium from previous cutting which as well healed.  Soft, nontender,  slightly distended.  Liver palpable 3 fingerbreadth's below the right costal  margin.  Unable to palpate the splenomegaly.  Given the patient's body  habitus, there is no rebound tenderness or guarding.  EXTREMITIES:  Without clubbing.  He does have trace lower extremity edema  bilaterally.  SKIN:  Pink, warm and dry without any rash or jaundice.   LABORATORY DATA:  See HPI.   IMPRESSION:  Mr. Eckard is a 45 year old Caucasian male with three week  history of constant severe mid abdominal pain which he rates 8/10 on pain  scale.  He has also had two days worth of diarrhea, initially, which has  resolved.  He began to have intermittent hematochezia with each of his loose  stools on a once daily basis.  He also complains of nausea on a daily basis  but denies any vomiting.  He does have history of IBS.  I suspect he may  have had gastroenteritis which had set off his IBS symptoms.  Nevertheless,  his symptoms have been quite severe over the last three weeks.  Therefore,  further evaluation is necessary to be sure we are not dealing with  inflammatory bowel disease.  Although, I feel this is less likely.  He also  has severe GERD, may have been reflux esophagitis or peptic ulcer disease  given his chronic daily nausea.   PLAN:  1.  Will schedule EGD and coloscopy with Dr.  Karilyn Cota in the near future.  I      have discussed the procedure with risks and benefits which include but      are not limited to bleeding, infection, perforation, drug reaction.  He      agrees and consent will be obtained.   DISCHARGE MEDICATIONS:  1.  Bentyl 10 mg q.a.c. and q.h.s. p.r.n. abdominal cramping and diarrhea      #60 with one refill.  2.  Vicodin 5/100 mg q.4-6 h p.r.n. severe pain #20 with no refills. 3.  Pending procedures, he may need further evaluation with CT scan as      previous  exam was not a  contrast exam.  We will await Dr. Patty Sermons      evaluation.   I would like to thank Dr. Lysbeth Galas for allowing [place] to participate in the care  of Mr. Kleckner.      Nicholas Lose, N.P.      Lionel December, M.D.  Electronically Signed    KC/MEDQ  D:  10/30/2005  T:  10/31/2005  Job:  [id number]   cc:   Delaney Meigs, M.D.  Fax: [phone]

## 2010-10-18 NOTE — Discharge Summary (Signed)
[name], [name]               ACCOUNT NO.:  [id number]   MEDICAL RECORD NO.:  [id number]          PATIENT TYPE:  IPS   LOCATION:  0504                          FACILITY:  [facility]   PHYSICIAN:  Anselm Jungling, MD  DATE OF BIRTH:  June 02, 1966   DATE OF ADMISSION:  01/14/2005  DATE OF DISCHARGE:  01/17/2005                                 DISCHARGE SUMMARY   IDENTIFYING DATA AND REASON FOR ADMISSION:  This was the first [facility] admission  for this 45 year old male who came with a history of binge drinking and  depression.  He is a patient of psychiatrist, Dr. Betti Cruz.  On January 12, 2005, 2 days prior to admission, the patient overdosed on Klonopin, Seroquel  and alcohol in an apparent suicide attempt.  Please refer to the admission  note for further details pertaining to the symptoms, circumstances and  history that led to his hospitalization at [facility].   INITIAL DIAGNOSTIC IMPRESSION:  AXIS I:  Alcohol dependence, mood disorder  not otherwise specified.  AXIS II:  Deferred.  AXIS III:  No acute or chronic illnesses.  AXIS IV:  Stressors, severe.  AXIS V:  Global assessment of function on admission 30.Marland Kitchen   MEDICAL AND LABORATORY:  There were no significant medical issues during  this brief inpatient psychiatric stay.  The patient was medically examined  and cleared prior to his admission to the adult inpatient psychiatric  service.  Urinalysis was within normal limits.   HOSPITAL COURSE:  The patient was started on a alcohol withdrawal protocol  based on Librium.  This appeared to ameliorate his withdrawal symptoms  adequately.  By the second hospital day, he indicated that he was feeling  much better, less depressed and more optimistic about future.  He was absent  suicidal ideation.   He was given a trial of Seroquel 100 mg p.o. q.6h. p.r.n. agitation.  This  was fairly helpful.  He had been continued on his usual Zoloft 200 mg daily,  and for GERD, Reglan 10 mg q.i.d. and Nexium 40  mg daily.   AFTERCARE:  The patient was discharged on the 4th hospital day.  At this  time, he was absent suicidal ideation and alcohol withdrawal symptoms.  His  mood was much improved.   DISCHARGE MEDICATIONS:  1.  Seroquel 300 mg p.o. q.h.s.  2.  Zoloft 200 mg p.o. daily.  3.  Reglan 10 mg q.i.d.  4.  Nexium 40 mg daily.   DISCHARGE DIAGNOSES:  AXIS I:  Alcohol dependence and mood disorder not  otherwise specified.  AXIS II:  Deferred.  AXIS III:  History of gastroesophageal reflux disease.  AXIS IV:  Stressors, severe.  AXIS V:  Global assessment of function on discharge 55.   DISPOSITION:  The patient was to follow up with Dr. Betti Cruz, his usual  psychiatrist, on January 27, 2005.           ______________________________  Anselm Jungling, MD  Electronically Signed     SPB/MEDQ  D:  02/12/2005  T:  02/12/2005  Job:  [id number]

## 2010-10-18 NOTE — Op Note (Signed)
NAME:  [name], [name]               ACCOUNT NO.:  [id number]   MEDICAL RECORD NO.:  [id number]          PATIENT TYPE:  AMB   LOCATION:  DAY                           FACILITY:  [facility]   PHYSICIAN:  Lionel December, M.D.    DATE OF BIRTH:  [birth year]   DATE OF PROCEDURE:  11/03/2005  DATE OF DISCHARGE:                                 OPERATIVE REPORT   PROCEDURE:  Esophagogastroduodenoscopy followed by colonoscopy.   ENDOSCOPIST:  Lionel December, M.D.   INDICATIONS:  He is a 45 year old Caucasian male with multiple medical  problems who has upper and mid abdominal pain associated with nausea as well  as melena and hematochezia.  He has a history of GERD as well as IBS.  He is  on Nexium and Reglan.  He had unenhanced abdominal CT which was unremarkable  and ultrasound suggested a small gallbladder polyp, but no evidence of  choledocholithiasis.  His CBC and LFTs have been normal.  His H. pylori  serology has been negative.  He is undergoing diagnostic evaluation.  Procedure and risks were reviewed with the patient, and informed consent was  obtained.   MEDICATIONS FOR CONSCIOUS SEDATION:  Benzocaine spray for oropharyngeal  topical anesthesia, Demerol 50 mg IV, Versed 17.5 mg IV.   FINDINGS:  Procedures performed in endoscopy suite.  The patient's vital  signs and O2 saturation were monitored during procedure and remained stable.  He had never could be well sedated for these procedures.   DESCRIPTION OF PROCEDURE:  1.  Esophagogastroduodenoscopy.  The patient was placed in left lateral      position and Olympus videoscope was passed via oropharynx without any      difficulty into esophagus.   Esophagus:  The mucosa of the esophagus was normal.  GE junction was wavy at  39 cm and hiatus was at 41.   Stomach:  It was empty and distended very well with insufflation.  Folds  proximal stomach were normal.  Examination of the mucosa revealed linear  erythema at antrum but no erosions or  ulcers were noted.   Pyloric channel was patent.  Angularis, fundus and cardia examined by  retroflexing the scope and were normal.   Duodenum and bulbar mucosa was normal.  Scope was passed into second part of  the duodenum where mucosa and folds were normal.  Endoscope was withdrawn.  The patient prepared for procedure #2.   1.  Colonoscopy.  Rectal examination performed.  No abnormality noted on      external or digital exam.  Olympus videoscope was placed in the rectum      and advanced under vision into the sigmoid colon and beyond.      Preparation was satisfactory.  Scope was passed into cecum which was      identified by appendiceal orifice and ileocecal valve.  Pictures taken      for the record.  As the scope was withdrawn, the colonic mucosa was      examined for the second time and was normal throughout.  Rectal mucosa      was normal.  Scope was retroflexed to examine the anorectal junction and      small hemorrhoids were noted below the dentate line.  Endoscope was then      withdrawn.  The patient tolerated the procedures well.   FINAL DIAGNOSES:  1.  Small sliding hiatal hernia and antral erythema, otherwise normal EGD.  2.  Normal colonoscopy except small external hemorrhoids.  3.  Suspect abdominal pain may be secondary to irritable bowel syndrome.      Etiology of multiple gastrointestinal symptoms remain unexplained.   RECOMMENDATIONS:  1.  He will continue Bentyl but discontinue Reglan at least for the time      being.  2.  Abdominopelvic CT with oral and IV contrast to be performed in the      future.  3.  The patient was given one Hemoccult card and he should bring [place] a sample      should he have black stools.      Lionel December, M.D.  Electronically Signed     NR/MEDQ  D:  11/03/2005  T:  11/04/2005  Job:  [id number]   cc:   Delaney Meigs, M.D.  Fax: [phone]

## 2010-11-28 ENCOUNTER — Other Ambulatory Visit: Payer: Self-pay | Admitting: Orthopedic Surgery

## 2010-11-28 DIAGNOSIS — M25569 Pain in unspecified knee: Secondary | ICD-10-CM

## 2010-12-02 ENCOUNTER — Other Ambulatory Visit: Payer: Medicare Other

## 2010-12-05 ENCOUNTER — Ambulatory Visit
Admission: RE | Admit: 2010-12-05 | Discharge: 2010-12-05 | Disposition: A | Discharge status: Home / Self Care | Payer: Medicare Other | Source: Ambulatory Visit | Attending: Orthopedic Surgery | Admitting: Orthopedic Surgery

## 2010-12-05 DIAGNOSIS — M25569 Pain in unspecified knee: Secondary | ICD-10-CM

## 2011-03-06 ENCOUNTER — Other Ambulatory Visit: Payer: Self-pay

## 2011-03-06 ENCOUNTER — Encounter: Payer: Self-pay | Admitting: *Deleted

## 2011-03-06 ENCOUNTER — Emergency Department (HOSPITAL_COMMUNITY): Payer: Medicare Other

## 2011-03-06 ENCOUNTER — Emergency Department (HOSPITAL_COMMUNITY)
Admission: EM | Admit: 2011-03-06 | Discharge: 2011-03-07 | Disposition: A | Discharge status: Home / Self Care | Payer: Medicare Other | Attending: Emergency Medicine | Admitting: Emergency Medicine

## 2011-03-06 DIAGNOSIS — F17200 Nicotine dependence, unspecified, uncomplicated: Secondary | ICD-10-CM | POA: Insufficient documentation

## 2011-03-06 DIAGNOSIS — R5381 Other malaise: Secondary | ICD-10-CM | POA: Insufficient documentation

## 2011-03-06 DIAGNOSIS — Z79899 Other long term (current) drug therapy: Secondary | ICD-10-CM | POA: Insufficient documentation

## 2011-03-06 DIAGNOSIS — R531 Weakness: Secondary | ICD-10-CM

## 2011-03-06 DIAGNOSIS — R5383 Other fatigue: Secondary | ICD-10-CM | POA: Insufficient documentation

## 2011-03-06 HISTORY — DX: Hyperlipidemia, unspecified: E78.5

## 2011-03-06 HISTORY — DX: Unspecified osteoarthritis, unspecified site: M19.90

## 2011-03-06 HISTORY — DX: Essential (primary) hypertension: I10

## 2011-03-06 HISTORY — DX: Heart failure, unspecified: I50.9

## 2011-03-06 LAB — COMPREHENSIVE METABOLIC PANEL
ALT: 14 U/L (ref 0–53)
AST: 21 U/L (ref 0–37)
Alkaline Phosphatase: 47 U/L (ref 39–117)
CO2: 24 mEq/L (ref 19–32)
Chloride: 101 mEq/L (ref 96–112)
GFR calc Af Amer: >90 mL/min (ref >90)
GFR calc non Af Amer: >90 mL/min (ref >90)
Glucose, Bld: 95 mg/dL (ref 70–99)
Potassium: 4.3 mEq/L (ref 3.5–5.1)
Sodium: 136 mEq/L (ref 135–145)

## 2011-03-06 MED ORDER — LACTULOSE 10 GM/15ML PO SOLN
30.0000 g | Freq: Once | ORAL | Status: AC
  Administered 2011-03-06: 30 g via ORAL
  Filled 2011-03-06: qty 45

## 2011-03-06 MED ORDER — LACTULOSE 10 GM/15ML PO SOLN
ORAL | Status: AC
  Filled 2011-03-06: qty 60

## 2011-03-06 NOTE — ED Notes (Signed)
Pt reports increased generalized weakness and palpitations x 7 weeks worsening today

## 2011-03-06 NOTE — ED Notes (Signed)
Pt states feels weak, with some nausea, dizziness and palpitations. Pt states can only walk 15 yards with a walker at home before feeling weak. Pt states this feeling has gradually worsened over past few days. Pt relies on  Spouse for everything to include answering questions.  Pt placed on cardiac monitor NSR noted.

## 2011-03-06 NOTE — ED Notes (Addendum)
Performed EKG on patient upon arrival

## 2011-03-07 LAB — URINALYSIS, ROUTINE W REFLEX MICROSCOPIC
Glucose, UA: NEGATIVE mg/dL
Hgb urine dipstick: NEGATIVE
Leukocytes, UA: NEGATIVE
Specific Gravity, Urine: 1.025 (ref 1.005–1.030)
pH: 6 (ref 5.0–8.0)

## 2011-03-07 NOTE — ED Provider Notes (Signed)
History    Scribed for No att. providers found, the patient was seen in room APA19/APA19. This chart was scribed by Katha Cabal. This patient's care was started at 20:59.     CSN: [id number] Arrival date & time: 03/06/2011  8:13 PM  Chief Complaint  Patient presents with  . Palpitations    (Consider location/radiation/quality/duration/timing/severity/associated sxs/prior treatment) HPI  [name] is a 45 y.o. male who presents to the Emergency Department complaining of gradual worsening of generalized weakness with increased difficulty walking that began 2 months ago. He denies palpitations contrary to triage note.  He feels he cannot walk distances because of generalized weakness and not focal SOB or chest pain.  Patient is ambulatory with use of walker.   Denies vomiting.  Patient has hx of HTN, asthma, hypercholesterolemia, chronic back pain and depression.  Patient takes prescription pain medication for back pain. Patient was hospitalized several months ago for right heart failure. History is supplemented by wife.    Dr. Rennis Golden cardiologist  PCP Dr. Lysbeth Galas    PAST MEDICAL HISTORY:  Past Medical History  Diagnosis Date  . Hypertension   . CHF (congestive heart failure)   . Arthritis   . Hyperlipemia     PAST SURGICAL HISTORY:  Past Surgical History  Procedure Date  . Tonsillectomy     FAMILY HISTORY:  No family history on file.   SOCIAL HISTORY: History   Social History  . Marital Status: Married    Spouse Name: N/A    Number of Children: N/A  . Years of Education: N/A   Social History Main Topics  . Smoking status: Current Everyday Smoker  . Smokeless tobacco: None  . Alcohol Use: No  . Drug Use:   . Sexually Active:    Other Topics Concern  . None   Social History Narrative  . None    Review of Systems 10 Systems reviewed and are negative for acute change except as noted in the HPI.  Allergies  Aspirin and Penicillins  Home Medications     Current Outpatient Rx  Name Route Sig Dispense Refill  . ACETAMINOPHEN 500 MG PO TABS Oral Take 500 mg by mouth as needed. For pain     . ALBUTEROL SULFATE (2.5 MG/3ML) 0.083% IN NEBU Nebulization Take 2.5 mg by nebulization as needed. For shortness of breath     . ALPRAZOLAM 2 MG PO TABS Oral Take 1-2 mg by mouth 4 (four) times daily.      . ATORVASTATIN CALCIUM 40 MG PO TABS Oral Take 40 mg by mouth every morning.      Marland Kitchen CLOMIPRAMINE HCL 25 MG PO CAPS Oral Take 25-50 mg by mouth daily. Take one tablet every morning and take 2 tablets every day at bedtimr     . DICLOFENAC SODIUM 1 % TD GEL Topical Apply 1 application topically 4 (four) times daily.      Marland Kitchen DIMENHYDRINATE 50 MG PO TABS Oral Take 50 mg by mouth as needed. For nausea     . DIPHENHYDRAMINE HCL 25 MG PO CAPS Oral Take 25 mg by mouth as needed. For allergies     . ESOMEPRAZOLE MAGNESIUM 40 MG PO CPDR Oral Take 40 mg by mouth at bedtime.      . FENOFIBRATE MICRONIZED 134 MG PO CAPS Oral Take 134 mg by mouth at bedtime.      . FUROSEMIDE 40 MG PO TABS Oral Take 40-80 mg by mouth every morning.      Marland Kitchen  HYDROCODONE-ACETAMINOPHEN 10-325 MG PO TABS Oral Take 1 tablet by mouth 5 (five) times daily as needed. For pain     . LEVOTHYROXINE SODIUM 100 MCG PO TABS Oral Take 100 mcg by mouth every morning.      Marland Kitchen LISINOPRIL 5 MG PO TABS Oral Take 5 mg by mouth every morning.      Marland Kitchen MORPHINE SULFATE CR 60 MG PO TB12 Oral Take 30-60 mg by mouth every 12 (twelve) hours. As needed for pain     . SPIRONOLACTONE 50 MG PO TABS Oral Take 50 mg by mouth every morning.      . TOPIRAMATE 100 MG PO TABS Oral Take 100 mg by mouth at bedtime.      . VENLAFAXINE HCL 75 MG PO TABS Oral Take 75 mg by mouth 3 (three) times daily.      Marland Kitchen PROMETHAZINE HCL 25 MG PO TABS Oral Take 25 mg by mouth every 6 (six) hours as needed. For nausea       BP 114/72  Pulse 77  Temp(Src) 98 F (36.7 C) (Oral)  Resp 17  Ht 6\' 1"  (1.854 m)  Wt 331 lb (150.141 kg)  BMI 43.67  kg/m2  SpO2 99%  Physical Exam  Constitutional: He is oriented to person, place, and time. He appears well-developed and well-nourished. No distress.       Patient is obese.   HENT:  Head: Normocephalic and atraumatic.  Eyes: Conjunctivae and EOM are normal. Pupils are equal, round, and reactive to light.  Neck: Neck supple.  Cardiovascular: Normal rate and regular rhythm.   No murmur heard. Pulmonary/Chest: Effort normal. He has no wheezes.       Distant lung sounds without wheezing   Abdominal: Soft. There is no tenderness. There is no rebound and no guarding.       Obese Abdomen   Musculoskeletal: He exhibits no tenderness.  Neurological: He is alert and oriented to person, place, and time. No cranial nerve deficit (2-12 intact ).       No focal deficit   Skin: No rash noted. No erythema.  Psychiatric: He has a normal mood and affect. His behavior is normal. His speech is slurred.       Per wife: speech slow and slurred is normal    ED Course  Procedures (including critical care time)  Labs Reviewed  URINALYSIS, ROUTINE W REFLEX MICROSCOPIC - Abnormal; Notable for the following:    Color, Urine AMBER (*) BIOCHEMICALS MAY BE AFFECTED BY COLOR   All other components within normal limits  COMPREHENSIVE METABOLIC PANEL - Abnormal; Notable for the following:    Total Bilirubin 0.2 (*)    All other components within normal limits  AMMONIA - Abnormal; Notable for the following:    Ammonia 103 (*)    All other components within normal limits  TROPONIN I  PRO B NATRIURETIC PEPTIDE  LIPASE, BLOOD  POCT I-STAT TROPONIN I   OTHER DATA REVIEWED: Nursing notes, vital signs, and past medical records reviewed.   DIAGNOSTIC STUDIES: Oxygen Saturation is 99% on room air, normal by my interpretation.     Cardiac Monitor:  NSR HR: 83  normal by my interpretation.      LABS / RADIOLOGY:  Results for orders placed during the hospital encounter of 03/06/11  TROPONIN I       Component Value Range   Troponin I <0.30  <0.30 (ng/mL)  PRO B NATRIURETIC PEPTIDE      Component Value  Range   BNP, POC 98.1  0 - 125 (pg/mL)  URINALYSIS, ROUTINE W REFLEX MICROSCOPIC      Component Value Range   Color, Urine AMBER (*) YELLOW    Appearance CLEAR  CLEAR    Specific Gravity, Urine 1.025  1.005 - 1.030    pH 6.0  5.0 - 8.0    Glucose, UA NEGATIVE  NEGATIVE (mg/dL)   Hgb urine dipstick NEGATIVE  NEGATIVE    Bilirubin Urine NEGATIVE  NEGATIVE    Ketones, ur NEGATIVE  NEGATIVE (mg/dL)   Protein, ur NEGATIVE  NEGATIVE (mg/dL)   Urobilinogen, UA 1.0  0.0 - 1.0 (mg/dL)   Nitrite NEGATIVE  NEGATIVE    Leukocytes, UA NEGATIVE  NEGATIVE   COMPREHENSIVE METABOLIC PANEL      Component Value Range   Sodium 136  135 - 145 (mEq/L)   Potassium 4.3  3.5 - 5.1 (mEq/L)   Chloride 101  96 - 112 (mEq/L)   CO2 24  19 - 32 (mEq/L)   Glucose, Bld 95  70 - 99 (mg/dL)   BUN 8  6 - 23 (mg/dL)   Creatinine, Ser 1.19  0.50 - 1.35 (mg/dL)   Calcium 14.7  8.4 - 10.5 (mg/dL)   Total Protein 7.4  6.0 - 8.3 (g/dL)   Albumin 4.0  3.5 - 5.2 (g/dL)   AST 21  0 - 37 (U/L)   ALT 14  0 - 53 (U/L)   Alkaline Phosphatase 47  39 - 117 (U/L)   Total Bilirubin 0.2 (*) 0.3 - 1.2 (mg/dL)   GFR calc non Af Amer >90  >90 (mL/min)   GFR calc Af Amer >90  >90 (mL/min)  LIPASE, BLOOD      Component Value Range   Lipase 19  11 - 59 (U/L)  AMMONIA      Component Value Range   Ammonia 103 (*) 11 - 60 (umol/L)  POCT I-STAT TROPONIN I      Component Value Range   Troponin i, poc 0.00  0.00 - 0.08 (ng/mL)   Comment 3              Dg Chest 2 View  03/06/2011  *RADIOLOGY REPORT*  Clinical Data: Shortness of breath.  Increased generalized weakness.  Palpitations.  Symptoms for 7 weeks but worsening today.  CHEST - 2 VIEW  Comparison: 09/14/2010  Findings: Shallow inspiration. The heart size and pulmonary vascularity are normal. The lungs appear clear and expanded without focal air space disease or  consolidation. No blunting of the costophrenic angles.  No significant changes since the previous study.  Degenerative changes in the spine.  IMPRESSION: No evidence of active pulmonary disease.  Original Report Authenticated By: Marlon Pel, M.D.   Ct Head Wo Contrast  03/06/2011  *RADIOLOGY REPORT*  Clinical Data: Weakness, dizziness, and nausea for 7 weeks.  CT HEAD WITHOUT CONTRAST  Technique:  Contiguous axial images were obtained from the base of the skull through the vertex without contrast.  Comparison: 09/12/2010  Findings: Diffuse cerebral atrophy.  Low attenuation changes in the deep white matter consistent with small vessel ischemia.  No mass effect or midline shift.  No abnormal extra-axial fluid collections.  Gray-white matter junctions are distinct.  Basal cisterns are not effaced.  Mild ventricular dilatation consistent with central atrophy.  No evidence of acute intracranial hemorrhage.  No significant changes since the previous study.  No depressed skull fractures.  Retention cyst in the floor of the left maxillary  antrum.  Old nasal bone fractures.  IMPRESSION: Chronic atrophic and small vessel ischemic changes.  No evidence of acute intracranial hemorrhage, mass lesion, or acute infarct.  Original Report Authenticated By: Marlon Pel, M.D.    ED COURSE / COORDINATION OF CARE: 9:13 PM  Physical exam complete.  Will order cardiac markers and CT Head and CXR.   11:14 PM  Ordered consult with Hospitalist.    Orders Placed This Encounter  Procedures  . CT Head Wo Contrast  . DG Chest 2 View  . Troponin I  . Pro b natriuretic peptide  . Urinalysis with microscopic  . Comprehensive metabolic panel  . Lipase, blood  . Ammonia  . Consult to hospitalist  . POCT i-Stat troponin I  . ED EKG     Date: 03/07/2011  Rate: 80  Rhythm: normal sinus rhythm  QRS Axis: normal  Intervals: normal  ST/T Wave abnormalities: normal  Conduction Disutrbances:none  Narrative  Interpretation:   Old EKG Reviewed: unchanged   MDM   MDM: Progressive generalized weakness and declines ADLs over the past 2 months. Patient admitted to the hospital in April after lithium toxicity. He is no longer on lithium. Wife states he has not been back to baseline since discharge. Both patient and wife report they're fed up with symptoms tonight. There is no focal weakness on exam. Baseline labs are obtained with CT head.  Labs are remarkable for elevated ammonia of 103. However patient's liver function tests are normal. He reports no history of liver problems. Per his wife the patient is at his chronic state of health where he has been since April. There is no acute change his presentation tonight. Case was discussed extensively with Dr. Orvan Falconer hospitalist on call who evaluated patient himself.  He is at his baseline and has been since his discharge in April.  The spurious ammonia level does not correlate with hepatic encephalopathy may be related to medication or other metabolic abnormality. Dr. Orvan Falconer feels does not warrant admission as this patient is at his baseline.  Patient and wife are agreeable to outpatient followup with PCP and neurology.  IMPRESSION: Diagnoses that have been ruled out:  Diagnoses that are still under consideration:  Final diagnoses:  Weakness     MEDICATIONS GIVEN IN THE E.D. Scheduled Meds:    . lactulose  30 g Oral Once   Continuous Infusions:     DISCHARGE MEDICATIONS: New Prescriptions   No medications on file     I personally performed the services described in this documentation, which was scribed in my presence.  The recorded information has been reviewed and considered.          Glynn Octave, MD 03/07/11 [phone]

## 2011-08-01 ENCOUNTER — Other Ambulatory Visit: Payer: Self-pay | Admitting: Gastroenterology

## 2011-08-01 DIAGNOSIS — R112 Nausea with vomiting, unspecified: Secondary | ICD-10-CM

## 2011-08-06 ENCOUNTER — Ambulatory Visit
Admission: RE | Admit: 2011-08-06 | Discharge: 2011-08-06 | Disposition: A | Discharge status: Home / Self Care | Payer: Medicare Other | Source: Ambulatory Visit | Attending: Gastroenterology | Admitting: Gastroenterology

## 2011-08-06 DIAGNOSIS — R112 Nausea with vomiting, unspecified: Secondary | ICD-10-CM

## 2011-08-13 ENCOUNTER — Ambulatory Visit: Payer: Medicare Other | Admitting: Cardiology

## 2011-08-19 ENCOUNTER — Ambulatory Visit: Payer: Medicare Other | Admitting: Cardiovascular Disease

## 2011-09-26 ENCOUNTER — Encounter: Payer: Self-pay | Admitting: Cardiology

## 2011-09-26 DIAGNOSIS — I509 Heart failure, unspecified: Secondary | ICD-10-CM | POA: Insufficient documentation

## 2011-09-26 DIAGNOSIS — G4730 Sleep apnea, unspecified: Secondary | ICD-10-CM | POA: Insufficient documentation

## 2011-09-26 DIAGNOSIS — J45909 Unspecified asthma, uncomplicated: Secondary | ICD-10-CM | POA: Insufficient documentation

## 2011-09-26 DIAGNOSIS — F319 Bipolar disorder, unspecified: Secondary | ICD-10-CM | POA: Insufficient documentation

## 2011-09-26 DIAGNOSIS — E039 Hypothyroidism, unspecified: Secondary | ICD-10-CM | POA: Insufficient documentation

## 2011-09-26 DIAGNOSIS — F209 Schizophrenia, unspecified: Secondary | ICD-10-CM | POA: Insufficient documentation

## 2011-09-26 DIAGNOSIS — R9430 Abnormal result of cardiovascular function study, unspecified: Secondary | ICD-10-CM | POA: Insufficient documentation

## 2011-09-26 DIAGNOSIS — I872 Venous insufficiency (chronic) (peripheral): Secondary | ICD-10-CM | POA: Insufficient documentation

## 2011-09-26 DIAGNOSIS — T56891A Toxic effect of other metals, accidental (unintentional), initial encounter: Secondary | ICD-10-CM | POA: Insufficient documentation

## 2011-09-26 DIAGNOSIS — E785 Hyperlipidemia, unspecified: Secondary | ICD-10-CM | POA: Insufficient documentation

## 2011-09-26 DIAGNOSIS — I10 Essential (primary) hypertension: Secondary | ICD-10-CM | POA: Insufficient documentation

## 2011-09-29 ENCOUNTER — Encounter: Payer: Self-pay | Admitting: Cardiology

## 2011-09-29 ENCOUNTER — Ambulatory Visit (INDEPENDENT_AMBULATORY_CARE_PROVIDER_SITE_OTHER): Payer: Medicare Other | Admitting: Cardiology

## 2011-09-29 VITALS — BP 109/74 | HR 89 | Ht 74.0 in | Wt 367.0 lb

## 2011-09-29 DIAGNOSIS — E669 Obesity, unspecified: Secondary | ICD-10-CM

## 2011-09-29 DIAGNOSIS — Z789 Other specified health status: Secondary | ICD-10-CM | POA: Insufficient documentation

## 2011-09-29 DIAGNOSIS — G4730 Sleep apnea, unspecified: Secondary | ICD-10-CM

## 2011-09-29 DIAGNOSIS — I10 Essential (primary) hypertension: Secondary | ICD-10-CM

## 2011-09-29 DIAGNOSIS — R609 Edema, unspecified: Secondary | ICD-10-CM | POA: Insufficient documentation

## 2011-09-29 DIAGNOSIS — R079 Chest pain, unspecified: Secondary | ICD-10-CM

## 2011-09-29 DIAGNOSIS — R9430 Abnormal result of cardiovascular function study, unspecified: Secondary | ICD-10-CM

## 2011-09-29 DIAGNOSIS — Z79899 Other long term (current) drug therapy: Secondary | ICD-10-CM

## 2011-09-29 DIAGNOSIS — F319 Bipolar disorder, unspecified: Secondary | ICD-10-CM

## 2011-09-29 DIAGNOSIS — I509 Heart failure, unspecified: Secondary | ICD-10-CM

## 2011-09-29 DIAGNOSIS — R0989 Other specified symptoms and signs involving the circulatory and respiratory systems: Secondary | ICD-10-CM

## 2011-09-29 DIAGNOSIS — E039 Hypothyroidism, unspecified: Secondary | ICD-10-CM

## 2011-09-29 MED ORDER — FUROSEMIDE 40 MG PO TABS: 40.0000 mg | ORAL_TABLET | Freq: Every day | ORAL | Status: DC | PRN

## 2011-09-29 NOTE — Progress Notes (Signed)
HPI Patient is seen to establish as a new patient for the management of diastolic CHF. The patient has been seen previously by another cardiology group.  The patient has requested that our team follow him now. I have been able to obtain old records. I have reviewed them and updated distal electronic medical record. The patient has a history of normal systolic function. He does have a history of fluid overload and this is compatible with diastolic dysfunction. He has not had a problem with marked hypertension. He does have a problem with significant psychiatric problems that have been managed over time with appropriate medications.  Patient does not have chest pain. He has exertional shortness of breath. At this time he has significant peripheral edema with erythema of his lower extremities related to this.  Allergies  Allergen Reactions  . Aspirin Other (See Comments)    Causes asthmatic symptoms  . Penicillins Rash    Current Outpatient Prescriptions  Medication Sig Dispense Refill  . acetaminophen (TYLENOL) 500 MG tablet Take 500 mg by mouth as needed. For pain       . albuterol (PROVENTIL) (2.5 MG/3ML) 0.083% nebulizer solution Take 2.5 mg by nebulization as needed. For shortness of breath       . alprazolam (XANAX) 2 MG tablet Take 1-2 mg by mouth 4 (four) times daily.        . Cholecalciferol (VITAMIN D-3) 5000 UNITS TABS Take 1 tablet by mouth daily.      . clomiPRAMINE (ANAFRANIL) 25 MG capsule Take one tablet every morning and take 2 tablets every day at bedtimr      . diclofenac sodium (VOLTAREN) 1 % GEL Apply 1 application topically 4 (four) times daily.        Marland Kitchen dimenhyDRINATE (DRAMAMINE) 50 MG tablet Take 50 mg by mouth as needed. For nausea       . diphenhydrAMINE (BENADRYL) 25 mg capsule Take 25 mg by mouth as needed. For allergies       . esomeprazole (NEXIUM) 40 MG capsule Take 40 mg by mouth at bedtime.        . fenofibrate micronized (LOFIBRA) 134 MG capsule Take 134 mg  by mouth at bedtime.        . fish oil-omega-3 fatty acids 1000 MG capsule Take 1 g by mouth 2 (two) times daily.      . furosemide (LASIX) 40 MG tablet Take 40-80 mg by mouth daily as needed.       Marland Kitchen HYDROcodone-acetaminophen (NORCO) 10-325 MG per tablet Take 1 tablet by mouth 5 (five) times daily as needed. For pain       . lactulose (CHRONULAC) 10 GM/15ML solution Take 30 mLs by mouth Three times a day.      . levothyroxine (SYNTHROID, LEVOTHROID) 100 MCG tablet Take 100 mcg by mouth every morning.        Marland Kitchen lisinopril (PRINIVIL,ZESTRIL) 10 MG tablet Take 1 tablet by mouth Daily.      . Magnesium 250 MG TABS Take 1 tablet by mouth daily.      . metoCLOPramide (REGLAN) 10 MG tablet Take 5 tablets by mouth as directed.      . morphine (MS CONTIN) 60 MG 12 hr tablet Take 60 mg by mouth every 12 (twelve) hours. As needed for pain      . Multiple Vitamin (MULTIVITAMIN) tablet Take 1 tablet by mouth daily.      . Nutritional Supplements (LIVER DEFENSE) TABS Take 1 tablet by mouth  daily.      . promethazine (PHENERGAN) 25 MG tablet Take 25 mg by mouth every 6 (six) hours as needed. For nausea       . ranitidine (ZANTAC) 150 MG tablet Take 150 mg by mouth 2 (two) times daily as needed.      Marland Kitchen spironolactone (ALDACTONE) 50 MG tablet Take 50 mg by mouth every morning.        . topiramate (TOPAMAX) 100 MG tablet Take 200 mg by mouth at bedtime.       Marland Kitchen venlafaxine (EFFEXOR) 75 MG tablet Take 75 mg by mouth 3 (three) times daily.        . vitamin B-12 (CYANOCOBALAMIN) 500 MCG tablet Take 500 mcg by mouth daily.      Marland Kitchen dicyclomine (BENTYL) 20 MG tablet Take 1 tablet by mouth 4 times daily.        History   Social History  . Marital Status: Married    Spouse Name: N/A    Number of Children: N/A  . Years of Education: N/A   Occupational History  . Not on file.   Social History Main Topics  . Smoking status: Current Everyday Smoker -- 1.5 packs/day for 20 years    Types: Cigarettes  . Smokeless  tobacco: Never Used  . Alcohol Use: No  . Drug Use: Not on file  . Sexually Active: Not on file   Other Topics Concern  . Not on file   Social History Narrative  . No narrative on file    No family history on file.  Past Medical History  Diagnosis Date  . Hypertension   . CHF (congestive heart failure)     Diastolic  . Arthritis   . Hyperlipemia   . Sleep apnea     He wore CPAP once  . Asthma   . Schizophrenia   . Hypothyroidism   . Bipolar 1 disorder   . Ejection fraction     EF 50-55%, March, 2012, technically difficult study, some RV dilatation  . Venous insufficiency   . Lithium toxicity     April, 2012  . Edema     Secondary to diastolic CHF    Past Surgical History  Procedure Date  . Tonsillectomy     ROS   Patient denies fever, chills, headache, sweats, rash, change in vision, change in hearing, chest pain, cough, nausea vomiting, urinary symptoms. All other systems are reviewed and are negative.  PHYSICAL EXAM Patient is here with his wife. He is oriented to person time and place. Affect is normal. He is significantly overweight. There is no jugulovenous distention. There no carotid bruits. Lungs are clear. Respiratory effort is nonlabored. Cardiac exam reveals S1 and S2. There no clicks or significant murmurs. The abdomen is protuberant but soft. The patient has 2-3+ peripheral edema bilaterally. He has erythema in both lower extremities related to his edema. There are no musculoskeletal deformities.  Filed Vitals:   09/29/11 1335  BP: 109/74  Pulse: 89  Height: 6\' 2"  (1.88 m)  Weight: 367 lb (166.47 kg)  SpO2: 99%   EKG is done today and reviewed by me. There is no significant abnormality. ASSESSMENT & PLAN

## 2011-09-29 NOTE — Patient Instructions (Addendum)
Follow up in 3-4 weeks. We will contact you with this appointment information. Take Lasix 80 mg daily for 3 days and then resume previous dosing. Decrease salt and fluid intake. Your physician recommends that you go to the [facility] for lab work: [employer].

## 2011-09-29 NOTE — Assessment & Plan Note (Signed)
Blood pressure is controlled on current medications. No change in therapy. 

## 2011-09-29 NOTE — Assessment & Plan Note (Signed)
The patient has sleep apnea. It is my understanding that he tried CPAP once but does not use it on a regular basis. Certainly this would be helpful if he could use it.

## 2011-09-29 NOTE — Assessment & Plan Note (Signed)
I really do not have a feel for his overall psychiatric diagnoses. However he is treated well and he is watched carefully by his wife.

## 2011-09-29 NOTE — Assessment & Plan Note (Signed)
The patient does have shortness of breath and some of this may be related to his asthma plus his fluid overload.

## 2011-09-29 NOTE — Assessment & Plan Note (Signed)
Patient has had muscle aches from statins in the past.

## 2011-09-29 NOTE — Assessment & Plan Note (Signed)
The patient has marked volume overload related to diastolic dysfunction. We know historically that he has been able to diuresis and the past. I had a long and complete discussion with the patient and his wife about limiting salt intake. He definitely drinks excess fluid. We talked at length about cutting back the amount of fluid. He drinks a lot because he is thirsty and because he takes many pills during the day. The patient also needs to elevate his feet when he is not walking. He will also take 80 mg of Lasix for 3 days and then cut back to 40 mg. Chemistry level be checked today. I will see him back in followup. He does not need a followup echo at this time. We know that he had normal systolic function one year ago. This can be reassessed at a later date.

## 2011-09-29 NOTE — Assessment & Plan Note (Signed)
The patient has significant edema and some erythema of his feet. I believe this will diuresis and improve.

## 2011-09-29 NOTE — Assessment & Plan Note (Signed)
It is known that his ejection fraction was low normal in March, 2012. The study was technically difficult. There was some evidence of RV dysfunction. We will not repeat his echo at this time.

## 2011-09-29 NOTE — Assessment & Plan Note (Signed)
The patient has a history of hypothyroidism. He is on thyroid medication.

## 2011-10-21 ENCOUNTER — Ambulatory Visit: Payer: Medicare Other | Admitting: Cardiology

## 2011-10-29 ENCOUNTER — Encounter: Payer: Self-pay | Admitting: Cardiology

## 2011-10-29 NOTE — Progress Notes (Signed)
Labs from May 17 show that the creatinine has gone up significantly. He did have a creatinine in this range of 2.1 in the past but is improved. Have given ordered today to hold the lisinopril and stop the spironolactone.: Have followup labs.

## 2011-10-30 ENCOUNTER — Telehealth: Payer: Self-pay | Admitting: *Deleted

## 2011-10-30 DIAGNOSIS — I509 Heart failure, unspecified: Secondary | ICD-10-CM

## 2011-10-30 DIAGNOSIS — R609 Edema, unspecified: Secondary | ICD-10-CM

## 2011-10-30 NOTE — Telephone Encounter (Signed)
Patient informed and lab order faxed to [facility] lab. 

## 2011-10-30 NOTE — Telephone Encounter (Signed)
Message copied by Eustace Moore on Thu Oct 30, 2011 10:16 AM ------      Message from: Myrtis Ser, [place] D      Created: Wed Oct 29, 2011  2:10 PM       These labs show that the patient has had a significant increase in his creatinine. Please stop his lisinopril immediately and stop his spironolactone. Obtain followup be met one week after these meds were stopped.

## 2011-11-03 ENCOUNTER — Telehealth: Payer: Self-pay | Admitting: *Deleted

## 2011-11-03 NOTE — Telephone Encounter (Signed)
Wife wanted to get clarification about lab work results that were discussed last week with patient. Nurse advised her of elevated kidney function, patient informed to stop medications that could cause it to be elevated or make it worse and f/u labs needed this Thursday. Wife verbalized understanding of plan.

## 2011-11-11 ENCOUNTER — Telehealth: Payer: Self-pay | Admitting: *Deleted

## 2011-11-11 MED ORDER — POTASSIUM CHLORIDE CRYS ER 20 MEQ PO TBCR: 20.0000 meq | EXTENDED_RELEASE_TABLET | Freq: Every day | ORAL | Status: DC

## 2011-11-11 NOTE — Telephone Encounter (Signed)
Sister concerned that BP isn't getting treated. Nurse advised her that patient missed last appointment and needed to that one rescheduled and patient's BP could be evaluated at that time. Appointment given for 13th at 8:45am.

## 2011-11-11 NOTE — Telephone Encounter (Signed)
Message copied by Eustace Moore on Tue Nov 11, 2011  9:29 AM ------      Message from: Myrtis Ser, [place] D      Created: Mon Nov 10, 2011  4:42 PM       Her kidney function has improved significantly since the prior labs. However her potassium is decreased now. Please start KCl 20 mEq by mouth daily

## 2011-11-11 NOTE — Telephone Encounter (Signed)
Patient's daughter Arline Asp informed.

## 2011-11-13 ENCOUNTER — Ambulatory Visit: Payer: Medicare Other | Admitting: Cardiology

## 2011-11-17 ENCOUNTER — Other Ambulatory Visit: Payer: Self-pay | Admitting: *Deleted

## 2011-11-17 MED ORDER — ALBUTEROL SULFATE HFA 108 (90 BASE) MCG/ACT IN AERS: 2.0000 | INHALATION_SPRAY | RESPIRATORY_TRACT | Status: DC | PRN

## 2011-12-02 ENCOUNTER — Other Ambulatory Visit: Payer: Self-pay | Admitting: *Deleted

## 2011-12-02 MED ORDER — ALBUTEROL SULFATE HFA 108 (90 BASE) MCG/ACT IN AERS: 2.0000 | INHALATION_SPRAY | RESPIRATORY_TRACT | Status: DC | PRN

## 2011-12-18 ENCOUNTER — Other Ambulatory Visit: Payer: Self-pay

## 2011-12-19 ENCOUNTER — Telehealth: Payer: Self-pay | Admitting: *Deleted

## 2011-12-19 NOTE — Telephone Encounter (Signed)
Nurse from PCP office called to make MD aware of new lab results, (that have been scanned), into chart and also inform MD of patients BP being elevated at office visits with Izola Price. Patient has been put back on lisinopril and spironolactone since kidney function has came back to normal. Dr. Izola Price wanted MD to be aware of care and will forward a copy of office notes to our office for scanning.

## 2012-01-01 NOTE — Telephone Encounter (Signed)
Noted  

## 2012-01-05 ENCOUNTER — Other Ambulatory Visit: Payer: Self-pay | Admitting: Cardiology

## 2012-01-05 NOTE — Telephone Encounter (Signed)
Fax Received. Refill Completed. [name] (R.M.A)   

## 2012-01-13 ENCOUNTER — Ambulatory Visit: Payer: Medicare Other | Admitting: Cardiology

## 2012-01-18 ENCOUNTER — Encounter: Payer: Self-pay | Admitting: Cardiology

## 2012-02-01 ENCOUNTER — Other Ambulatory Visit: Payer: Self-pay | Admitting: Cardiology

## 2012-02-04 ENCOUNTER — Other Ambulatory Visit (HOSPITAL_COMMUNITY): Payer: Self-pay | Admitting: Gastroenterology

## 2012-02-04 DIAGNOSIS — R110 Nausea: Secondary | ICD-10-CM

## 2012-02-05 ENCOUNTER — Telehealth: Payer: Self-pay | Admitting: Hematology & Oncology

## 2012-02-05 NOTE — Telephone Encounter (Signed)
Left pt message to call and schedule appointment °

## 2012-02-06 ENCOUNTER — Telehealth: Payer: Self-pay | Admitting: Hematology & Oncology

## 2012-02-06 ENCOUNTER — Ambulatory Visit: Payer: Medicare Other | Admitting: Physician Assistant

## 2012-02-06 NOTE — Telephone Encounter (Signed)
Left message for pt to call and schedule appointment °

## 2012-02-09 ENCOUNTER — Telehealth: Payer: Self-pay | Admitting: Hematology & Oncology

## 2012-02-09 NOTE — Telephone Encounter (Signed)
Left message to call for appointment

## 2012-02-12 ENCOUNTER — Telehealth: Payer: Self-pay | Admitting: Hematology & Oncology

## 2012-02-12 ENCOUNTER — Other Ambulatory Visit (HOSPITAL_COMMUNITY): Payer: Medicare Other

## 2012-02-12 NOTE — Telephone Encounter (Signed)
Carollee Herter from referring is aware pt has not returned my calls for appointment

## 2012-02-13 ENCOUNTER — Telehealth: Payer: Self-pay | Admitting: Hematology & Oncology

## 2012-02-13 NOTE — Telephone Encounter (Signed)
Pt aware of 02-16-12 appointment °

## 2012-02-16 ENCOUNTER — Ambulatory Visit (HOSPITAL_BASED_OUTPATIENT_CLINIC_OR_DEPARTMENT_OTHER): Payer: Medicare Other | Admitting: Hematology & Oncology

## 2012-02-16 ENCOUNTER — Ambulatory Visit: Payer: Medicare Other

## 2012-02-16 ENCOUNTER — Other Ambulatory Visit (HOSPITAL_BASED_OUTPATIENT_CLINIC_OR_DEPARTMENT_OTHER): Payer: Medicare Other | Admitting: Lab

## 2012-02-16 VITALS — BP 119/65 | HR 94 | Temp 98.3°F | Resp 22 | Ht 70.0 in | Wt 381.0 lb

## 2012-02-16 DIAGNOSIS — D649 Anemia, unspecified: Secondary | ICD-10-CM

## 2012-02-16 DIAGNOSIS — R188 Other ascites: Secondary | ICD-10-CM

## 2012-02-16 DIAGNOSIS — K760 Fatty (change of) liver, not elsewhere classified: Secondary | ICD-10-CM

## 2012-02-16 DIAGNOSIS — K7689 Other specified diseases of liver: Secondary | ICD-10-CM

## 2012-02-16 LAB — CBC WITH DIFFERENTIAL (CANCER CENTER ONLY)
BASO#: 0.1 10*3/uL (ref 0.0–0.2)
BASO%: 0.6 % (ref 0.0–2.0)
EOS%: 4.1 % (ref 0.0–7.0)
HCT: 28.6 % — ABNORMAL LOW (ref 38.7–49.9)
LYMPH%: 31.2 % (ref 14.0–48.0)
MCHC: 31.8 g/dL — ABNORMAL LOW (ref 32.0–35.9)
MCV: 92 fL (ref 82–98)
MONO#: 0.8 10*3/uL (ref 0.1–0.9)
NEUT%: 55.3 % (ref 40.0–80.0)
RDW: 15.5 % (ref 11.1–15.7)

## 2012-02-16 LAB — TECHNOLOGIST REVIEW CHCC SATELLITE: Tech Review: 2

## 2012-02-16 NOTE — Progress Notes (Signed)
This office note has been dictated.

## 2012-02-17 ENCOUNTER — Other Ambulatory Visit (HOSPITAL_COMMUNITY): Payer: Medicare Other

## 2012-02-17 ENCOUNTER — Ambulatory Visit (HOSPITAL_COMMUNITY)
Admission: RE | Admit: 2012-02-17 | Discharge: 2012-02-17 | Disposition: A | Discharge status: Home / Self Care | Payer: Medicare Other | Source: Ambulatory Visit | Attending: Hematology & Oncology | Admitting: Hematology & Oncology

## 2012-02-17 ENCOUNTER — Other Ambulatory Visit: Payer: Self-pay | Admitting: Hematology & Oncology

## 2012-02-17 DIAGNOSIS — K760 Fatty (change of) liver, not elsewhere classified: Secondary | ICD-10-CM

## 2012-02-17 DIAGNOSIS — R143 Flatulence: Secondary | ICD-10-CM | POA: Insufficient documentation

## 2012-02-17 DIAGNOSIS — R142 Eructation: Secondary | ICD-10-CM | POA: Insufficient documentation

## 2012-02-17 DIAGNOSIS — R141 Gas pain: Secondary | ICD-10-CM | POA: Insufficient documentation

## 2012-02-17 DIAGNOSIS — R188 Other ascites: Secondary | ICD-10-CM

## 2012-02-17 DIAGNOSIS — D649 Anemia, unspecified: Secondary | ICD-10-CM

## 2012-02-17 DIAGNOSIS — K7689 Other specified diseases of liver: Secondary | ICD-10-CM | POA: Insufficient documentation

## 2012-02-17 NOTE — Progress Notes (Signed)
CC:   Graylin Shiver, M.D. Joycelyn Rua, M.D. Luis Abed, MD, [facility] Daine Floras, M.D.  DIAGNOSES: 1. Anemia. 2. Thrombocytosis.  HISTORY OF PRESENT ILLNESS:  Mr. Mabile is a real nice 46 year old white gentleman.  He is followed by multiple doctors.  He does have bipolar disorder.  His main doctor is Rosana Berger.  Mr. Murch has had issues with his blood.  He has been noted to have an elevated ammonia.  He does see Dr. Evette Cristal of GI.  He has been told he has a "fatty liver."  Mr. Saefong is mostly worried about his weight gain.  He said he has gained a lot of weight around his abdomen. He said his stomach is getting larger.  He had blood work done back in June.  A CBC done that showed a white cell count of 7.6, hemoglobin 10.9, hematocrit 34.6, platelet count 580. MCV was 93.  He had an iron level done, which was 46.  He does have quite a few medications.  He does have multiple health problems.  He did have a markedly low testosterone of 0.7 (normal 6.8-21.5).  He has not had any check on his liver recently.  He does state that he now has seen Dr. Evette Cristal.  Again, Dr. Evette Cristal says that he has a "fatty liver."  We were subsequently asked to see him because of this anemia.  He has really bad knee problems.  He said because of his weight he cannot have any surgery.  His last abdominal ultrasound was done back in March.  This showed a fatty liver.  There was no mention of any type of ascites.  He does have hypothyroidism.  He says this is being monitored closely. The last TSH that I see on him was back in 2012, in April, and it was 0.54.  He had been on lithium.  This was again back in April 2012.  He is not on this any longer.  He had a B12 level back a year ago, in April, of 238.  Again, back in April 2012, his iron studies showed a ferritin of 237 with iron saturation of 20%.  PAST MEDICAL HISTORY: 1. Bipolar disorder. 2. Hypertension. 3. Sleep  apnea. 4. Congestive heart failure. 5. Irritable bowel syndrome. 6. Constipation. 7. Hypothyroidism. 8. Anxiety. 9. Depression.  ALLERGIES: 1. Aspirin. 2. Penicillin. 3. Lipitor.  MEDICATIONS: 1. Albuterol inhaler 2 puffs q.4 hours. 2. Xanax 2 mg q.i.d. 3. Vitamin D 5000 units daily. 4. Bentyl 20 mg p.o. q.i.d. 5. Nexium 40 mg p.o. q.h.s. 6. Lofibra 134 mg daily. 7. Lasix 40-80 mg p.o. daily p.r.n. 8. Vicodin (10/325) 1 p.o. 5 times a day p.r.n. 9. Potassium 20 mg p.o. daily. 10.Lactose 30 mL t.i.d. 11.Synthroid 0.1 mg p.o. daily. 12.Zestril 10 mg daily. 13.Reglan 50 mg p.o. as needed. 14.MS Contin 60 mg p.o. q.12 hours. 15.MiraLAX 17 g dose p.o. every other day. 16.Phenergan 25 mg q.6 hours p.r.n. 17.Aldactone 50 mg daily. 18.Restoril 30 mg p.o. q.h.s. 19.Depo-Testosterone 200 mg q. month. 20.Zanaflex 4 mg q.i.d. 21.Effexor 75 mg p.o. t.i.d. 22.Vitamin B12, 500 mcg daily. 23.Proventil nebulizer 2.5 mg as needed.  SOCIAL HISTORY:  Remarkable for past tobacco use.  He stopped drinking 2 years ago.  He said he drank beer.  He has no obvious occupational exposures.  FAMILY HISTORY:  Noncontributory.  REVIEW OF SYSTEMS:  As stated in history of present illness.  PHYSICAL EXAMINATION:  This is a morbidly obese white gentleman  in no obvious distress.  Vital signs:  Temperature of 98.3, pulse 94, respiratory rate 22, blood pressure 119/65.  Weight is 381.  Head and neck:  Normocephalic, atraumatic skull.  There are no ocular or oral lesions.  He has no scleral icterus.  There is no adenopathy in his neck.  Thyroid is nonpalpable.  Lungs:  With decreased breath sounds at the bases.  No wheezes are noted bilaterally.  Cardiac:  Regular rate and rhythm with a normal S1, S2.  There are no murmurs, rubs or bruits. Abdomen:  Obese.  He does appear to have ascites.  There does appear to be a fluid wave.  He has no guarding or rebound tenderness.  I really cannot palpate his  liver or spleen.  Extremities:  Marked lymphedema in his legs bilaterally.  His legs are very firm and markedly swollen from chronic lymphedema.  He really has limited range of motion of his knees. Skin:  Chronic stasis dermatitis changes in his legs.  He has had no rashes elsewhere.  Neurological:  No focal neurological deficits.  LABORATORY STUDIES:  White cell count is 9.4, hemoglobin 9.1, hematocrit 28.6, platelet count 453.  MCV is 92.  His peripheral smear shows moderate anisocytosis and poikilocytosis.  He has some ovalocytes and stomatocytes. There are no nucleated red blood cells.  I see no teardrop cells.  He has no rouleaux formation.  White cells appear with no maturation of his neutrophils.  I see an occasional myelocyte.  He has no hypersegmented polys.  There are no atypical lymphocytes.  Platelets are adequate in number and size.  IMPRESSION:  Mr. Shuck is a 46 year old gentleman.  This is a pretty complicated situation.  The only thing I can think of for the elevated ammonia is his liver. That is only thing I could think of that causes an elevated ammonia. Certainly he does not appear to be affected by this past ammonia elevation.  I just wonder if his liver is worse than what we think.  He certainly looks like he has ascites.  As such, I want see about an ultrasound and paracentesis on him.  I also think that a CT scan probably would not be a bad idea, looking at his abdomen and pelvis.  I wonder about varices.  I wonder if he does not have some kind of GI bleeding.  I am sending off iron studies on him.  I know that lithium causes leukocytosis.  However, is not on any lithium now.  The anemia also could be reflective of low testosterone.  As far as we can tell, he does not have any kidney disease so I would not think that an erythropoietin deficiency would be possible or probable.  Mr. Chittum is a really nice guy.  He has a lot of health issues.  He is on  quite a few medications.  I am not sure as to what is going on with respect to the heart.  He said he did have heart failure.  I suspect sleep apnea also may be an issue.  I am not sure if he does CPAP or not.  We will await the results of his lab work.  I will also await the results of his paracentesis and ultrasound and CT scan.  I spent a good hour and a half with he and his family member.    ______________________________ Josph Macho, M.D. PRE/MEDQ  D:  02/16/2012  T:  02/17/2012  Job:  [id number]

## 2012-02-18 ENCOUNTER — Ambulatory Visit (HOSPITAL_BASED_OUTPATIENT_CLINIC_OR_DEPARTMENT_OTHER)
Admission: RE | Admit: 2012-02-18 | Discharge: 2012-02-18 | Disposition: A | Discharge status: Home / Self Care | Payer: Medicare Other | Source: Ambulatory Visit | Attending: Hematology & Oncology | Admitting: Hematology & Oncology

## 2012-02-18 ENCOUNTER — Ambulatory Visit: Payer: Medicare Other | Admitting: Physician Assistant

## 2012-02-18 ENCOUNTER — Other Ambulatory Visit (HOSPITAL_COMMUNITY): Payer: Medicare Other

## 2012-02-18 DIAGNOSIS — R188 Other ascites: Secondary | ICD-10-CM

## 2012-02-18 DIAGNOSIS — R1084 Generalized abdominal pain: Secondary | ICD-10-CM | POA: Insufficient documentation

## 2012-02-18 DIAGNOSIS — R599 Enlarged lymph nodes, unspecified: Secondary | ICD-10-CM | POA: Insufficient documentation

## 2012-02-18 DIAGNOSIS — K7689 Other specified diseases of liver: Secondary | ICD-10-CM | POA: Insufficient documentation

## 2012-02-18 DIAGNOSIS — R160 Hepatomegaly, not elsewhere classified: Secondary | ICD-10-CM | POA: Insufficient documentation

## 2012-02-18 DIAGNOSIS — D649 Anemia, unspecified: Secondary | ICD-10-CM

## 2012-02-18 DIAGNOSIS — K760 Fatty (change of) liver, not elsewhere classified: Secondary | ICD-10-CM

## 2012-02-18 LAB — COMPREHENSIVE METABOLIC PANEL
AST: 30 U/L (ref 0–37)
BUN: 10 mg/dL (ref 6–23)
Calcium: 10 mg/dL (ref 8.4–10.5)
Chloride: 99 mEq/L (ref 96–112)
Creatinine, Ser: 1.42 mg/dL — ABNORMAL HIGH (ref 0.50–1.35)
Glucose, Bld: 87 mg/dL (ref 70–99)

## 2012-02-18 LAB — IRON AND TIBC
Iron: 71 ug/dL (ref 42–165)
UIBC: 376 ug/dL (ref 125–400)

## 2012-02-18 LAB — FERRITIN: Ferritin: 293 ng/mL (ref 22–322)

## 2012-02-18 LAB — RETICULOCYTES (CHCC): RBC.: 3.22 MIL/uL — ABNORMAL LOW (ref 4.22–5.81)

## 2012-02-18 MED ORDER — IOHEXOL 300 MG/ML  SOLN
100.0000 mL | Freq: Once | INTRAMUSCULAR | Status: AC | PRN
  Administered 2012-02-18: 100 mL via INTRAVENOUS

## 2012-02-23 ENCOUNTER — Other Ambulatory Visit: Payer: Self-pay | Admitting: Hematology & Oncology

## 2012-02-23 ENCOUNTER — Telehealth: Payer: Self-pay | Admitting: Hematology & Oncology

## 2012-02-23 DIAGNOSIS — D649 Anemia, unspecified: Secondary | ICD-10-CM

## 2012-02-23 NOTE — Telephone Encounter (Signed)
Pt aware of 02-27-12 appointment

## 2012-02-24 ENCOUNTER — Other Ambulatory Visit (HOSPITAL_COMMUNITY): Payer: Medicare Other

## 2012-02-24 ENCOUNTER — Inpatient Hospital Stay
Admission: AD | Admit: 2012-02-24 | Payer: Self-pay | Source: Other Acute Inpatient Hospital | Admitting: Internal Medicine

## 2012-02-24 ENCOUNTER — Telehealth: Payer: Self-pay | Admitting: *Deleted

## 2012-02-24 NOTE — Telephone Encounter (Signed)
Patient c/o up all night coughing and spitting up clear liquid. Wife states that he did feel warm but unable to check temperature. No c/o increased sob or swelling in extremities. No c/o chest pain or dizziness. Wife called wanting patient to be seen today instead of Thursday. No available appointment in clinic today.

## 2012-02-24 NOTE — Progress Notes (Signed)
Disposition note  46 year old male with a past medical history significant for known alcoholic fatty liver disease, congestive heart failure (appears to be diastolic in nature, but not 2-D echo on our system at this point), hypothyroidism, chronic back pain, hypogonadism (with a really low testosterone level), bipolar disorder, hypertension and a sleep apnea; who has been transfer from [facility] emergency department as per patient request (cardiology and oncology belongs to [facility]), for further evaluation and treatment of increased cough, low-grade fever, elevated wbc's in bilateral lower extremity erythema/warmness and swelling. High concerns for bilateral cellulitis. Blood cultures taken at ED and started on vancomycin.  [name] [phone]

## 2012-02-24 NOTE — Telephone Encounter (Signed)
Left message for patient to call office on message machine.

## 2012-02-24 NOTE — Telephone Encounter (Signed)
Pt not seen in office seen initial referral to Dr Myrtis Ser for management of DHF. A f/u 2D echo was recommended, but not done. Also noticed in Epic that pt was scheduled for a US Paracentesis, which was cancelled. I recommend that pt be seen initially by either primary MD, or the nearest ER staff.

## 2012-02-25 NOTE — Telephone Encounter (Signed)
Spoke with patient and he was seen at ED on yesterday as suggested below.

## 2012-02-26 ENCOUNTER — Other Ambulatory Visit: Payer: Self-pay

## 2012-02-26 ENCOUNTER — Ambulatory Visit (INDEPENDENT_AMBULATORY_CARE_PROVIDER_SITE_OTHER): Payer: Medicare Other | Admitting: Physician Assistant

## 2012-02-26 ENCOUNTER — Encounter: Payer: Self-pay | Admitting: Physician Assistant

## 2012-02-26 VITALS — BP 127/76 | HR 60 | Ht 72.0 in | Wt 365.0 lb

## 2012-02-26 DIAGNOSIS — I509 Heart failure, unspecified: Secondary | ICD-10-CM

## 2012-02-26 DIAGNOSIS — I10 Essential (primary) hypertension: Secondary | ICD-10-CM

## 2012-02-26 DIAGNOSIS — E8779 Other fluid overload: Secondary | ICD-10-CM

## 2012-02-26 DIAGNOSIS — E8770 Fluid overload, unspecified: Secondary | ICD-10-CM

## 2012-02-26 MED ORDER — FUROSEMIDE 40 MG PO TABS: 40.0000 mg | ORAL_TABLET | Freq: Two times a day (BID) | ORAL | Status: DC

## 2012-02-26 NOTE — Assessment & Plan Note (Signed)
Patient does not appear to be in decompensated heart failure by history and exam, despite marked volume overload and marked, chronic LE edema. We'll await recent CXR and lab results, before making further recommendationsd

## 2012-02-26 NOTE — Assessment & Plan Note (Signed)
Patient is markedly volume overload, which apparently has been persistent for approximately 2 years. He has been on Lasix over this time frame. I am considering substituting with Demadex, for more effective diuresis. However, I will await most recent blood work from the ED were he was recently treated. We'll also await lab results from Dr. Gustavo Lah office, with whom he is scheduled to follow tomorrow. I also shared my concern that he is on supplemental potassium, while taking Aldactone. I will plan on having him return to the clinic in 1 month for reassessment and further recommendations.

## 2012-02-26 NOTE — Progress Notes (Signed)
Primary Cardiologist: Jerral Bonito, MD   HPI: Patient presents for scheduled followup, last seen here in clinic in April, by Dr. Myrtis Ser, when he presented for initial evaluation for diastolic CHF.  More recently, he was just treated 2 days ago in the ED in [place], [place], following complaint of "water in his mouth". He reportedly was treated with IV Lasix, and then discharged with no adjustment of his diuretic regimen. He states that he had 2 x-rays, which were both negative. He also reportedly had negative blood work. He states that he was not diagnosed with CHF.  Patient has lost 2 pounds since his last OV. He is on Lasix 40 twice a day. He denies PND, orthopnea, but does have some moderate DOE. He has marked bilateral LE edema.  Patient has also recently established with Dr. Arlan Organ, and is scheduled to see him tomorrow. He states that he is due for routine blood work, including  monitoring of electrolytes and renal function.  12-lead EKG today, reviewed by me, indicates NSR 60 bpm, with nonspecific changes  Allergies  Allergen Reactions  . Aspirin Other (See Comments)    Causes asthmatic symptoms  . Statins     Joint pain   . Penicillins Rash    Current Outpatient Prescriptions  Medication Sig Dispense Refill  . acetaminophen (TYLENOL) 500 MG tablet Take 500 mg by mouth as needed. For pain       . albuterol (PROAIR HFA) 108 (90 BASE) MCG/ACT inhaler Inhale 2 puffs into the lungs every 4 (four) hours as needed.  1 each  0  . albuterol (PROVENTIL) (2.5 MG/3ML) 0.083% nebulizer solution Take 2.5 mg by nebulization as needed. For shortness of breath       . alprazolam (XANAX) 2 MG tablet Take 1-2 mg by mouth 4 (four) times daily.        . Cholecalciferol (VITAMIN D-3) 5000 UNITS TABS Take 1 tablet by mouth daily.      Marland Kitchen dicyclomine (BENTYL) 20 MG tablet Take 1 tablet by mouth 4 times daily.      . diphenhydrAMINE (BENADRYL) 25 mg capsule Take 25 mg by mouth as needed. For allergies         . esomeprazole (NEXIUM) 40 MG capsule Take 40 mg by mouth at bedtime.        . fenofibrate micronized (LOFIBRA) 134 MG capsule Take 134 mg by mouth at bedtime.        . fish oil-omega-3 fatty acids 1000 MG capsule Take 1 g by mouth 2 (two) times daily.      . furosemide (LASIX) 40 MG tablet Take 1 tablet (40 mg total) by mouth 2 (two) times daily.      Marland Kitchen HYDROcodone-acetaminophen (NORCO) 10-325 MG per tablet Take 1 tablet by mouth 5 (five) times daily as needed. For pain       . KLOR-CON M20 20 MEQ tablet TAKE ONE TABLET BY MOUTH ONE TIME DAILY  30 tablet  2  . lactulose (CHRONULAC) 10 GM/15ML solution Take 30 mLs by mouth Three times a day.      . Lactulose POWD by Does not apply route 3 (three) times daily.      Marland Kitchen levothyroxine (SYNTHROID, LEVOTHROID) 100 MCG tablet Take 100 mcg by mouth every morning.        Marland Kitchen lisinopril (PRINIVIL,ZESTRIL) 10 MG tablet 10 mg daily.       . Magnesium 250 MG TABS Take 1 tablet by mouth daily.      Marland Kitchen  metoCLOPramide (REGLAN) 10 MG tablet Take 5 tablets by mouth as directed.      . morphine (MS CONTIN) 60 MG 12 hr tablet Take 60 mg by mouth every 12 (twelve) hours. As needed for pain      . Multiple Vitamin (MULTIVITAMIN) tablet Take 1 tablet by mouth daily.      . ondansetron (ZOFRAN) 4 MG tablet 4 mg every 12 (twelve) hours as needed.       . polyethylene glycol powder (GLYCOLAX/MIRALAX) powder 17 g every other day.       . promethazine (PHENERGAN) 25 MG tablet Take 25 mg by mouth every 6 (six) hours as needed. For nausea       . spironolactone (ALDACTONE) 50 MG tablet 50 mg daily.       . temazepam (RESTORIL) 30 MG capsule Take 30 mg by mouth at bedtime as needed.      . testosterone cypionate (DEPOTESTOTERONE CYPIONATE) 200 MG/ML injection       . tiZANidine (ZANAFLEX) 4 MG tablet 4 mg every 6 (six) hours as needed.       . venlafaxine (EFFEXOR) 75 MG tablet Take 75 mg by mouth 3 (three) times daily.        . vitamin B-12 (CYANOCOBALAMIN) 500 MCG tablet  Take 500 mcg by mouth daily.      Marland Kitchen DISCONTD: furosemide (LASIX) 40 MG tablet TAKE 1-2 TABLETS (40-80MG  TOTAL) BY MOUTH ONCE A DAY AS NEEDED  45 tablet  5    Past Medical History  Diagnosis Date  . Hypertension   . CHF (congestive heart failure)     Diastolic  . Arthritis   . Hyperlipemia   . Sleep apnea     He wore CPAP once  . Asthma   . Schizophrenia   . Hypothyroidism   . Bipolar 1 disorder   . Ejection fraction     EF 50-55%, March, 2012, technically difficult study, some RV dilatation  . Venous insufficiency   . Lithium toxicity     April, 2012  . Edema     Secondary to diastolic CHF  . Obesity   . Statin intolerance     Muscle aches from statins in the past    Past Surgical History  Procedure Date  . Tonsillectomy     History   Social History  . Marital Status: Married    Spouse Name: N/A    Number of Children: N/A  . Years of Education: N/A   Occupational History  . Not on file.   Social History Main Topics  . Smoking status: Current Every Day Smoker -- 1.5 packs/day for 20 years    Types: Cigarettes  . Smokeless tobacco: Never Used  . Alcohol Use: No  . Drug Use: Not on file  . Sexually Active: Not on file   Other Topics Concern  . Not on file   Social History Narrative  . No narrative on file    No family history on file.  ROS: no nausea, vomiting; no fever, chills; no melena, hematochezia; no claudication  PHYSICAL EXAM: BP 127/76  Pulse 60  Ht 6' (1.829 m)  Wt 365 lb (165.563 kg)  BMI 49.50 kg/m2 GENERAL: 46 year-old male, morbidly obese, sitting in wheelchair; NAD HEENT: NCAT, PERRLA, EOMI; sclera clear; no xanthelasma NECK:  Unable to assessJVD, secondary to neck girth ; no TM LUNGS: CTA bilaterally CARDIAC: RRR (S1, S2); no significant murmurs; no rubs or gallops ABDOMEN:  Markedly protuberant EXTREMETIES:  4+ bilateral peripheral edema, with mild erythema  SKIN: warm/dry; no obvious rash/lesions MUSCULOSKELETAL: no joint  deformity NEURO: no focal deficit; NL affect   EKG: Please refer to electronic medical record   ASSESSMENT & PLAN:  Volume overload Patient is markedly volume overload, which apparently has been persistent for approximately 2 years. He has been on Lasix over this time frame. I am considering substituting with Demadex, for more effective diuresis. However, I will await most recent blood work from the ED were he was recently treated. We'll also await lab results from Dr. Gustavo Lah office, with whom he is scheduled to follow tomorrow. I also shared my concern that he is on supplemental potassium, while taking Aldactone. I will plan on having him return to the clinic in 1 month for reassessment and further recommendations.  CHF (congestive heart failure) Patient does not appear to be in decompensated heart failure by history and exam, despite marked volume overload and marked, chronic LE edema. We'll await recent CXR and lab results, before making further recommendationsd    Gene Oriel Rumbold, PAC

## 2012-02-26 NOTE — Patient Instructions (Signed)
Continue all current medications. Follow up in  1 month  

## 2012-02-27 ENCOUNTER — Ambulatory Visit (HOSPITAL_BASED_OUTPATIENT_CLINIC_OR_DEPARTMENT_OTHER): Payer: Medicare Other | Admitting: Hematology & Oncology

## 2012-02-27 ENCOUNTER — Other Ambulatory Visit (HOSPITAL_BASED_OUTPATIENT_CLINIC_OR_DEPARTMENT_OTHER): Payer: Medicare Other | Admitting: Lab

## 2012-02-27 VITALS — BP 123/50 | HR 63 | Temp 98.0°F | Resp 22 | Ht 70.0 in | Wt 366.0 lb

## 2012-02-27 DIAGNOSIS — K7689 Other specified diseases of liver: Secondary | ICD-10-CM

## 2012-02-27 DIAGNOSIS — D473 Essential (hemorrhagic) thrombocythemia: Secondary | ICD-10-CM

## 2012-02-27 DIAGNOSIS — D649 Anemia, unspecified: Secondary | ICD-10-CM

## 2012-02-27 DIAGNOSIS — K921 Melena: Secondary | ICD-10-CM

## 2012-02-27 LAB — CBC WITH DIFFERENTIAL (CANCER CENTER ONLY)
BASO#: 0.1 10*3/uL (ref 0.0–0.2)
Eosinophils Absolute: 0.7 10*3/uL — ABNORMAL HIGH (ref 0.0–0.5)
HGB: 9.1 g/dL — ABNORMAL LOW (ref 13.0–17.1)
LYMPH#: 4.1 10*3/uL — ABNORMAL HIGH (ref 0.9–3.3)
MCH: 29.7 pg (ref 28.0–33.4)
MONO#: 1 10*3/uL — ABNORMAL HIGH (ref 0.1–0.9)
NEUT#: 5 10*3/uL (ref 1.5–6.5)
RBC: 3.06 10*6/uL — ABNORMAL LOW (ref 4.20–5.70)

## 2012-02-27 LAB — TECHNOLOGIST REVIEW CHCC SATELLITE

## 2012-02-27 NOTE — Progress Notes (Signed)
This office note has been dictated.

## 2012-03-01 LAB — AMMONIA: Ammonia: 64 umol/L — ABNORMAL HIGH (ref 16–53)

## 2012-03-01 LAB — RETICULOCYTES (CHCC)
ABS Retic: 140.9 10*3/uL (ref 19.0–186.0)
RBC.: 3.13 MIL/uL — ABNORMAL LOW (ref 4.22–5.81)

## 2012-03-01 LAB — TESTOSTERONE: Testosterone: 137.72 ng/dL — ABNORMAL LOW (ref 300–890)

## 2012-03-01 LAB — ERYTHROPOIETIN: Erythropoietin: 37.5 m[IU]/mL — ABNORMAL HIGH (ref 2.6–34.0)

## 2012-03-01 NOTE — Progress Notes (Signed)
CC:   [name], M.D. Mark Abed, MD, [name], M.D. [name], M.D.  DIAGNOSIS: 1. Anemia, multifactorial. 2. Thrombocytosis, possible iron deficiency. 3. Hepatomegaly. 4. Diastolic dysfunction.  CURRENT THERAPY:  Observation.  INTERIM HISTORY:  [name] comes in for a 2nd office visit.  When we first saw him, we did routine lab work on him.  His iron studies, to me, showed that he is somewhat iron deficient.  His ferritin is 293 but his iron saturation is only 16%.  His iron is 71.  He had some renal insufficiency with a BUN of 10, creatinine 1.42.  He is on numerous medications.  He did have an ultrasound done of his abdomen, thinking that he had ascites.  The ultrasound did not show any evidence of ascites.  This was done on 09/17.  He also had a CT scan of the abdomen and pelvis done on 09/18.  This showed enlarged liver consistent with hepatic steatosis. No lesions were noted in the liver.  Everything else looked okay within the abdomen.  He had patent portal and splenic veins.  He did have some retroperitoneal lymphadenopathy that was mild at best.  This was nonspecific.  He has seen cardiology.  They did not feel that he had decompensated heart failure.  They are thinking about trying to readjust his medications.  I think that if we get his anemia better I think some of the fluid might be able to be taken out of him.  He is taking some testosterone at home.  He is not sure what the testosterone dose is.  He says that he does this once a month.  He has had only 1 dose to date.  From our records, he is on 200 mg.  I am also checking erythropoietin level on him.  I just want to see if he is making enough erythropoietin to stimulate his bone marrow.  Otherwise, he has had no issues.  PHYSICAL EXAMINATION:  General:  This is a morbidly obese white gentleman in no obvious distress.  Vital signs:  98, pulse 63, respiratory rate 20,  blood pressure 123/50.  Weight is 366.  Head and neck:  Normocephalic, atraumatic skull.  There are no ocular or oral lesions.  There are no palpable cervical or supraclavicular lymph nodes. Lungs:  Clear bilaterally.  Cardiac:  Regular rate and rhythm with a normal S1 and S2.  There are no murmurs, rubs or bruits.  Abdomen:  Soft with good bowel sounds.  He is morbidly obese.  He has decent bowel sounds.  There is no obvious fluid wave.  There is no palpable hepatomegaly or splenomegaly.  Extremities:  Massive edema in his lower legs.  He has brawny edema.  He has stasis dermatitis changes in the legs.  LABORATORY STUDIES:  Today show a white cell count 10.8, hemoglobin 9.1, hematocrit 28.6, platelet count 430.  I looked at his blood smear.  The blood smear did show some anisocytosis.  He did have some hypochromic and microcytic red cells. There is some slight polychromasia.  I do not see any nucleated red blood cells.  White cells appear normal in morphology and maturation.  IMPRESSION:  [name] is a 46 year old gentleman with multiple medical problems.  He is morbidly obese.  He has anemia.  I just wonder if he is iron deficient.  I think the anemia with thrombocytosis goes along with iron deficiency.  I think my 1st step is to replace  his iron.  It will be very interesting to see what his erythropoietin level is.  If this is low we may have to consider giving him Aranesp.  I also wonder about testosterone.  He may need to have a high level of testosterone or be taking the testosterone more often in order to improve his anemia.  Again, I think if his anemia gets better then I think some of the fluid can come off his body.  We will go ahead and plan to probably get him back next week.  Again, I will wait the results of his other studies.  As far as his ammonia goes this has to be from his hepatic steatosis. He is asymptomatic with  it.    ______________________________ [name], M.D. PRE/MEDQ  D:  02/27/2012  T:  02/27/2012  Job:  [id number]

## 2012-03-08 ENCOUNTER — Encounter (HOSPITAL_COMMUNITY): Payer: Medicare Other | Attending: Gastroenterology

## 2012-03-09 ENCOUNTER — Ambulatory Visit: Payer: Medicare Other | Admitting: Cardiology

## 2012-03-11 ENCOUNTER — Other Ambulatory Visit: Payer: Self-pay | Admitting: Hematology & Oncology

## 2012-03-11 ENCOUNTER — Telehealth: Payer: Self-pay | Admitting: Hematology & Oncology

## 2012-03-11 DIAGNOSIS — D509 Iron deficiency anemia, unspecified: Secondary | ICD-10-CM

## 2012-03-11 DIAGNOSIS — E349 Endocrine disorder, unspecified: Secondary | ICD-10-CM

## 2012-03-11 DIAGNOSIS — D631 Anemia in chronic kidney disease: Secondary | ICD-10-CM

## 2012-03-11 NOTE — Telephone Encounter (Signed)
Pt aware of 10-11 and 11-7 appointments

## 2012-03-12 ENCOUNTER — Ambulatory Visit (HOSPITAL_BASED_OUTPATIENT_CLINIC_OR_DEPARTMENT_OTHER): Payer: Medicare Other

## 2012-03-12 ENCOUNTER — Encounter: Payer: Self-pay | Admitting: *Deleted

## 2012-03-12 VITALS — BP 138/74 | HR 103 | Temp 97.6°F | Resp 24

## 2012-03-12 DIAGNOSIS — L0390 Cellulitis, unspecified: Secondary | ICD-10-CM

## 2012-03-12 DIAGNOSIS — E349 Endocrine disorder, unspecified: Secondary | ICD-10-CM

## 2012-03-12 DIAGNOSIS — E291 Testicular hypofunction: Secondary | ICD-10-CM

## 2012-03-12 DIAGNOSIS — D509 Iron deficiency anemia, unspecified: Secondary | ICD-10-CM

## 2012-03-12 MED ORDER — TESTOSTERONE CYPIONATE 200 MG/ML IM SOLN
400.0000 mg | Freq: Once | INTRAMUSCULAR | Status: AC
  Administered 2012-03-12: 400 mg via INTRAMUSCULAR

## 2012-03-12 MED ORDER — VANCOMYCIN HCL 1000 MG IV SOLR
2000.0000 mg | Freq: Once | INTRAVENOUS | Status: AC
  Administered 2012-03-12: 2000 mg via INTRAVENOUS
  Filled 2012-03-12: qty 2000

## 2012-03-12 MED ORDER — SODIUM CHLORIDE 0.9 % IV SOLN
Freq: Once | INTRAVENOUS | Status: AC
  Administered 2012-03-12: 14:00:00 via INTRAVENOUS

## 2012-03-12 MED ORDER — FERUMOXYTOL INJECTION 510 MG/17 ML
510.0000 mg | Freq: Once | INTRAVENOUS | Status: AC
  Administered 2012-03-12: 510 mg via INTRAVENOUS
  Filled 2012-03-12: qty 17

## 2012-03-12 NOTE — Patient Instructions (Addendum)
Ferumoxytol injection What is this medicine? FERUMOXYTOL is an iron complex. Iron is used to make healthy red blood cells, which carry oxygen and nutrients throughout the body. This medicine is used to treat iron deficiency anemia in people with chronic kidney disease. This medicine may be used for other purposes; ask your health care provider or pharmacist if you have questions. What should I tell my health care provider before I take this medicine? They need to know if you have any of these conditions: -anemia not caused by low iron levels -high levels of iron in the blood -magnetic resonance imaging (MRI) test scheduled -an unusual or allergic reaction to iron, other medicines, foods, dyes, or preservatives -pregnant or trying to get pregnant -breast-feeding How should I use this medicine? This medicine is for infusion into a vein. It is given by a health care professional in a hospital or clinic setting. Talk to your pediatrician regarding the use of this medicine in children. Special care may be needed. Overdosage: If you think you've taken too much of this medicine contact a poison control center or emergency room at once. Overdosage: If you think you have taken too much of this medicine contact a poison control center or emergency room at once. NOTE: This medicine is only for you. Do not share this medicine with others. What if I miss a dose? It is important not to miss your dose. Call your doctor or health care professional if you are unable to keep an appointment. What may interact with this medicine? This medicine may interact with the following medications: -other iron products This list may not describe all possible interactions. Give your health care provider a list of all the medicines, herbs, non-prescription drugs, or dietary supplements you use. Also tell them if you smoke, drink alcohol, or use illegal drugs. Some items may interact with your medicine. What should I watch  for while using this medicine? Visit your doctor or healthcare professional regularly. Tell your doctor or healthcare professional if your symptoms do not start to get better or if they get worse. You may need blood work done while you are taking this medicine. You may need to follow a special diet. Talk to your doctor. Foods that contain iron include: whole grains/cereals, dried fruits, beans, or peas, leafy green vegetables, and organ meats (liver, kidney). What side effects may I notice from receiving this medicine? Side effects that you should report to your doctor or health care professional as soon as possible: -allergic reactions like skin rash, itching or hives, swelling of the face, lips, or tongue -breathing problems -changes in blood pressure -feeling faint or lightheaded, falls -fever or chills -flushing, sweating, or hot feelings -swelling of the ankles or feet Side effects that usually do not require medical attention (Report these to your doctor or health care professional if they continue or are bothersome.): -diarrhea -headache -nausea, vomiting -stomach pain This list may not describe all possible side effects. Call your doctor for medical advice about side effects. You may report side effects to FDA at 1-800-FDA-1088. Where should I keep my medicine? This drug is given in a hospital or clinic and will not be stored at home. NOTE: This sheet is a summary. It may not cover all possible information. If you have questions about this medicine, talk to your doctor, pharmacist, or health care provider.  2012, Elsevier/Gold Standard. (02/09/2008 9:48:25 PM)Vancomycin injection What is this medicine? VANCOMYCIN Zenaida Niece koe MYE sin) is a glycopeptide antibiotic. It is used  to treat certain kinds of bacterial infections. It will not work for colds, flu, or other viral infections. This medicine may be used for other purposes; ask your health care provider or pharmacist if you have  questions. What should I tell my health care provider before I take this medicine? They need to know if you have any of these conditions: -dehydration -hearing loss -kidney disease -other chronic illness -an unusual or allergic reaction to vancomycin, other medicines, foods, dyes, or preservatives -pregnant or trying to get pregnant -breast-feeding How should I use this medicine? This medicine is infused into a vein. It is usually given by a health care provider in a hospital or clinic. If you receive this medicine at home, you will receive special instructions. Take your medicine at regular intervals. Do not take your medicine more often than directed. Take all of your medicine as directed even if you think you are better. Do not skip doses or stop your medicine early. It is important that you put your used needles and syringes in a special sharps container. Do not put them in a trash can. If you do not have a sharps container, call your pharmacist or healthcare provider to get one. Talk to your pediatrician regarding the use of this medicine in children. While this drug may be prescribed for even very young infants for selected conditions, precautions do apply. Overdosage: If you think you have taken too much of this medicine contact a poison control center or emergency room at once. NOTE: This medicine is only for you. Do not share this medicine with others. What if I miss a dose? If you miss a dose, take it as soon as you can. If it is almost time for your next dose, take only that dose. Do not take double or extra doses. What may interact with this medicine? -amphotericin B -anesthetics -bacitracin -cisplatin -colistin -diuretics -other aminoglycoside antibiotics -polymyxin B This list may not describe all possible interactions. Give your health care provider a list of all the medicines, herbs, non-prescription drugs, or dietary supplements you use. Also tell them if you smoke, drink  alcohol, or use illegal drugs. Some items may interact with your medicine. What should I watch for while using this medicine? Tell your doctor or health care professional if your symptoms do not improve or if you get new symptoms. Your condition and lab work will be monitored while you are taking this medicine. Do not treat diarrhea with over the counter products. Contact your doctor if you have diarrhea that lasts more than 2 days or if it is severe and watery. What side effects may I notice from receiving this medicine? Side effects that you should report to your doctor or health care professional as soon as possible: -allergic reactions like skin rash, itching or hives, swelling of the face, lips, or tongue -breathing difficulty, wheezing -change in amount, color of urine -change in hearing -chest pain -dizziness -fever, chills -flushing of the face and neck (reddening) -low blood pressure -redness, blistering, peeling or loosening of the skin, including inside the mouth -unusual bleeding or bruising -unusually weak or tired Side effects that usually do not require medical attention (report to your doctor or health care professional if they continue or are bothersome): -nausea, vomiting -pain, swelling where injected -stomach cramps This list may not describe all possible side effects. Call your doctor for medical advice about side effects. You may report side effects to FDA at 1-800-FDA-1088. Where should I keep my medicine?  Keep out of the reach of children. You will be instructed on how to store this medicine, if needed. Throw away any unused medicine after the expiration date on the label. NOTE: This sheet is a summary. It may not cover all possible information. If you have questions about this medicine, talk to your doctor, pharmacist, or health care provider.  2012, [employer]vier/Gold Standard. (02/22/2008 2:34:06 PM)Testosterone injection What is this medicine? TESTOSTERONE (tes TOS  ter one) is the main male hormone. It supports normal male development such as muscle growth, facial hair, and deep voice. It is used in males to treat low testosterone levels. This medicine may be used for other purposes; ask your health care provider or pharmacist if you have questions. What should I tell my health care provider before I take this medicine? They need to know if you have any of these conditions: -breast cancer -diabetes -heart disease -kidney disease -liver disease -lung disease -prostate cancer, enlargement -an unusual or allergic reaction to testosterone, other medicines, foods, dyes, or preservatives -pregnant or trying to get pregnant -breast-feeding How should I use this medicine? This medicine is for injection into a muscle. It is usually given by a health care professional in a hospital or clinic setting. Contact your pediatrician regarding the use of this medicine in children. While this medicine may be prescribed for children as young as 66 years of age for selected conditions, precautions do apply. Overdosage: If you think you have taken too much of this medicine contact a poison control center or emergency room at once. NOTE: This medicine is only for you. Do not share this medicine with others. What if I miss a dose? Try not to miss a dose. Your doctor or health care professional will tell you when your next injection is due. Notify the office if you are unable to keep an appointment. What may interact with this medicine? -medicines for diabetes -medicines that treat or prevent blood clots like warfarin -oxyphenbutazone -propranolol -steroid medicines like prednisone or cortisone This list may not describe all possible interactions. Give your health care provider a list of all the medicines, herbs, non-prescription drugs, or dietary supplements you use. Also tell them if you smoke, drink alcohol, or use illegal drugs. Some items may interact with your  medicine. What should I watch for while using this medicine? Visit your doctor or health care professional for regular checks on your progress. They will need to check the level of testosterone in your blood. This medicine may affect blood sugar levels. If you have diabetes, check with your doctor or health care professional before you change your diet or the dose of your diabetic medicine. This drug is banned from use in athletes by most athletic organizations. What side effects may I notice from receiving this medicine? Side effects that you should report to your doctor or health care professional as soon as possible: -allergic reactions like skin rash, itching or hives, swelling of the face, lips, or tongue -breast enlargement -breathing problems -changes in mood, especially anger, depression, or rage -dark urine -general ill feeling or flu-like symptoms -light-colored stools -loss of appetite, nausea -nausea, vomiting -right upper belly pain -stomach pain -swelling of ankles -too frequent or persistent erections -trouble passing urine or change in the amount of urine -unusually weak or tired -yellowing of the eyes or skin Additional side effects that can occur in women include: -deep or hoarse voice -facial hair growth -irregular menstrual periods Side effects that usually do not require medical  attention (report to your doctor or health care professional if they continue or are bothersome): -acne -change in sex drive or performance -hair loss -headache This list may not describe all possible side effects. Call your doctor for medical advice about side effects. You may report side effects to FDA at 1-800-FDA-1088. Where should I keep my medicine? Keep out of the reach of children. This medicine can be abused. Keep your medicine in a safe place to protect it from theft. Do not share this medicine with anyone. Selling or giving away this medicine is dangerous and against the  law. Store at room temperature between 20 and 25 degrees C (68 and 77 degrees F). Do not freeze. Protect from light. Follow the directions for the product you are prescribed. Throw away any unused medicine after the expiration date. NOTE: This sheet is a summary. It may not cover all possible information. If you have questions about this medicine, talk to your doctor, pharmacist, or health care provider.  2012, Elsevier/Gold Standard. (07/30/2007 4:13:46 PM)

## 2012-03-31 ENCOUNTER — Ambulatory Visit: Payer: Medicare Other | Admitting: Physician Assistant

## 2012-04-07 ENCOUNTER — Other Ambulatory Visit: Payer: Self-pay | Admitting: *Deleted

## 2012-04-08 ENCOUNTER — Other Ambulatory Visit (HOSPITAL_BASED_OUTPATIENT_CLINIC_OR_DEPARTMENT_OTHER): Payer: Medicare Other | Admitting: Lab

## 2012-04-08 ENCOUNTER — Ambulatory Visit (HOSPITAL_BASED_OUTPATIENT_CLINIC_OR_DEPARTMENT_OTHER): Payer: Medicare Other | Admitting: Hematology & Oncology

## 2012-04-08 ENCOUNTER — Ambulatory Visit (HOSPITAL_BASED_OUTPATIENT_CLINIC_OR_DEPARTMENT_OTHER): Payer: Medicare Other

## 2012-04-08 VITALS — BP 136/57 | HR 81 | Temp 98.7°F | Resp 22 | Ht 70.0 in | Wt 372.0 lb

## 2012-04-08 DIAGNOSIS — D509 Iron deficiency anemia, unspecified: Secondary | ICD-10-CM

## 2012-04-08 DIAGNOSIS — E291 Testicular hypofunction: Secondary | ICD-10-CM

## 2012-04-08 DIAGNOSIS — D631 Anemia in chronic kidney disease: Secondary | ICD-10-CM

## 2012-04-08 DIAGNOSIS — E349 Endocrine disorder, unspecified: Secondary | ICD-10-CM

## 2012-04-08 DIAGNOSIS — Z23 Encounter for immunization: Secondary | ICD-10-CM

## 2012-04-08 LAB — CBC WITH DIFFERENTIAL (CANCER CENTER ONLY)
BASO%: 0.5 % (ref 0.0–2.0)
EOS%: 3.1 % (ref 0.0–7.0)
LYMPH#: 2.7 10*3/uL (ref 0.9–3.3)
LYMPH%: 26.1 % (ref 14.0–48.0)
MONO#: 1.1 10*3/uL — ABNORMAL HIGH (ref 0.1–0.9)
Platelets: 469 10*3/uL — ABNORMAL HIGH (ref 145–400)
RDW: 14.5 % (ref 11.1–15.7)
WBC: 10.2 10*3/uL — ABNORMAL HIGH (ref 4.0–10.0)

## 2012-04-08 MED ORDER — INFLUENZA VIRUS VACC SPLIT PF IM SUSP
0.5000 mL | Freq: Once | INTRAMUSCULAR | Status: AC
  Administered 2012-04-08: 0.5 mL via INTRAMUSCULAR
  Filled 2012-04-08: qty 0.5

## 2012-04-08 MED ORDER — TESTOSTERONE CYPIONATE 200 MG/ML IM SOLN
400.0000 mg | INTRAMUSCULAR | Status: DC
  Administered 2012-04-08: 400 mg via INTRAMUSCULAR

## 2012-04-08 NOTE — Progress Notes (Signed)
This office note has been dictated.

## 2012-04-08 NOTE — Telephone Encounter (Signed)
error 

## 2012-04-08 NOTE — Patient Instructions (Addendum)
Testosterone injection What is this medicine? TESTOSTERONE (tes TOS ter one) is the main male hormone. It supports normal male development such as muscle growth, facial hair, and deep voice. It is used in males to treat low testosterone levels. This medicine may be used for other purposes; ask your health care provider or pharmacist if you have questions. What should I tell my health care provider before I take this medicine? They need to know if you have any of these conditions: -breast cancer -diabetes -heart disease -kidney disease -liver disease -lung disease -prostate cancer, enlargement -an unusual or allergic reaction to testosterone, other medicines, foods, dyes, or preservatives -pregnant or trying to get pregnant -breast-feeding How should I use this medicine? This medicine is for injection into a muscle. It is usually given by a health care professional in a hospital or clinic setting. Contact your pediatrician regarding the use of this medicine in children. While this medicine may be prescribed for children as young as 12 years of age for selected conditions, precautions do apply. Overdosage: If you think you have taken too much of this medicine contact a poison control center or emergency room at once. NOTE: This medicine is only for you. Do not share this medicine with others. What if I miss a dose? Try not to miss a dose. Your doctor or health care professional will tell you when your next injection is due. Notify the office if you are unable to keep an appointment. What may interact with this medicine? -medicines for diabetes -medicines that treat or prevent blood clots like warfarin -oxyphenbutazone -propranolol -steroid medicines like prednisone or cortisone This list may not describe all possible interactions. Give your health care provider a list of all the medicines, herbs, non-prescription drugs, or dietary supplements you use. Also tell them if you smoke, drink  alcohol, or use illegal drugs. Some items may interact with your medicine. What should I watch for while using this medicine? Visit your doctor or health care professional for regular checks on your progress. They will need to check the level of testosterone in your blood. This medicine may affect blood sugar levels. If you have diabetes, check with your doctor or health care professional before you change your diet or the dose of your diabetic medicine. This drug is banned from use in athletes by most athletic organizations. What side effects may I notice from receiving this medicine? Side effects that you should report to your doctor or health care professional as soon as possible: -allergic reactions like skin rash, itching or hives, swelling of the face, lips, or tongue -breast enlargement -breathing problems -changes in mood, especially anger, depression, or rage -dark urine -general ill feeling or flu-like symptoms -light-colored stools -loss of appetite, nausea -nausea, vomiting -right upper belly pain -stomach pain -swelling of ankles -too frequent or persistent erections -trouble passing urine or change in the amount of urine -unusually weak or tired -yellowing of the eyes or skin Additional side effects that can occur in women include: -deep or hoarse voice -facial hair growth -irregular menstrual periods Side effects that usually do not require medical attention (report to your doctor or health care professional if they continue or are bothersome): -acne -change in sex drive or performance -hair loss -headache This list may not describe all possible side effects. Call your doctor for medical advice about side effects. You may report side effects to FDA at 1-800-FDA-1088. Where should I keep my medicine? Keep out of the reach of children. This medicine   can be abused. Keep your medicine in a safe place to protect it from theft. Do not share this medicine with anyone.  Selling or giving away this medicine is dangerous and against the law. Store at room temperature between 20 and 25 degrees C (68 and 77 degrees F). Do not freeze. Protect from light. Follow the directions for the product you are prescribed. Throw away any unused medicine after the expiration date. NOTE: This sheet is a summary. It may not cover all possible information. If you have questions about this medicine, talk to your doctor, pharmacist, or health care provider.  2012, [employer]vier/Gold Standard. (07/30/2007 4:13:46 PM)Influenza Virus Vaccine injection (Fluarix) What is this medicine? INFLUENZA VIRUS VACCINE (in floo EN zuh VAHY ruhs vak SEEN) helps to reduce the risk of getting influenza also known as the flu. This medicine may be used for other purposes; ask your health care provider or pharmacist if you have questions. What should I tell my health care provider before I take this medicine? They need to know if you have any of these conditions: -bleeding disorder like hemophilia -fever or infection -Guillain-Barre syndrome or other neurological problems -immune system problems -infection with the human immunodeficiency virus (HIV) or AIDS -low blood platelet counts -multiple sclerosis -an unusual or allergic reaction to influenza virus vaccine, eggs, chicken proteins, latex, gentamicin, other medicines, foods, dyes or preservatives -pregnant or trying to get pregnant -breast-feeding How should I use this medicine? This vaccine is for injection into a muscle. It is given by a health care professional. A copy of Vaccine Information Statements will be given before each vaccination. Read this sheet carefully each time. The sheet may change frequently. Talk to your pediatrician regarding the use of this medicine in children. Special care may be needed. Overdosage: If you think you have taken too much of this medicine contact a poison control center or emergency room at once. NOTE: This  medicine is only for you. Do not share this medicine with others. What if I miss a dose? This does not apply. What may interact with this medicine? -chemotherapy or radiation therapy -medicines that lower your immune system like etanercept, anakinra, infliximab, and adalimumab -medicines that treat or prevent blood clots like warfarin -phenytoin -steroid medicines like prednisone or cortisone -theophylline -vaccines This list may not describe all possible interactions. Give your health care provider a list of all the medicines, herbs, non-prescription drugs, or dietary supplements you use. Also tell them if you smoke, drink alcohol, or use illegal drugs. Some items may interact with your medicine. What should I watch for while using this medicine? Report any side effects that do not go away within 3 days to your doctor or health care professional. Call your health care provider if any unusual symptoms occur within 6 weeks of receiving this vaccine. You may still catch the flu, but the illness is not usually as bad. You cannot get the flu from the vaccine. The vaccine will not protect against colds or other illnesses that may cause fever. The vaccine is needed every year. What side effects may I notice from receiving this medicine? Side effects that you should report to your doctor or health care professional as soon as possible: -allergic reactions like skin rash, itching or hives, swelling of the face, lips, or tongue Side effects that usually do not require medical attention (report to your doctor or health care professional if they continue or are bothersome): -fever -headache -muscle aches and pains -pain, tenderness, redness, or  swelling at site where injected -weak or tired This list may not describe all possible side effects. Call your doctor for medical advice about side effects. You may report side effects to FDA at 1-800-FDA-1088. Where should I keep my medicine? This vaccine is  only given in a clinic, pharmacy, doctor's office, or other health care setting and will not be stored at home. NOTE: This sheet is a summary. It may not cover all possible information. If you have questions about this medicine, talk to your doctor, pharmacist, or health care provider.  2012, Elsevier/Gold Standard. (12/15/2007 9:30:40 AM)

## 2012-04-09 LAB — ERYTHROPOIETIN: Erythropoietin: 15.4 m[IU]/mL (ref 2.6–34.0)

## 2012-04-09 LAB — IRON AND TIBC
TIBC: 500 ug/dL — ABNORMAL HIGH (ref 215–435)
UIBC: 456 ug/dL — ABNORMAL HIGH (ref 125–400)

## 2012-04-09 NOTE — Progress Notes (Signed)
CC:   Joycelyn Rua, M.D. Luis Abed, MD, [facility] Graylin Shiver, M.D. Daine Floras, M.D.  DIAGNOSES: 1. Anemia, iron deficiency. 2. Hypotestosteronemia. 3. Diastolic dysfunction.  CURRENT THERAPY: 1. Depo-Testosterone 400 mg IM q. month. 2. IV iron as indicated.  INTERIM HISTORY:  Mr. Pietrzyk comes in for followup.  He looks a whole lot better than when I last saw him.  The iron that we gave him has certainly helped.  He also is getting testosterone.  He says that he just feels better.  He has been able to do so much more now than what he used to do.  He has been walking.  He has been exercising.  He is now trying to lose a little weight.  He has not had problems with swelling in his legs.  He has chronic stasis dermatitis changes in his lower legs.  He has had no bleeding.  He has had no changes in bowel or bladder habits.  When we last saw him, his testosterone was only 137.  His erythropoietin level is 38 which, in my mind, is on the lower side.  PHYSICAL EXAMINATION:  General:  This is a morbidly obese white gentleman, in no obvious distress.  Vital signs:  Temperature of 98.7, pulse 81, respiratory rate 22, blood pressure 136/57.  Weight is 372. Head and neck:  Normocephalic, atraumatic skull.  He has no scleral icterus.  There are no oral lesions.  He has no adenopathy in the neck. Lungs:  Clear bilaterally.  He has some slight decrease at the bases. Cardiac:  Regular rate and rhythm, with a normal S1 and S2.  There are no murmurs, rubs, or bruits.  Abdomen:  Morbidly obese.  There is no fluid wave.  There is no obvious hepatosplenomegaly.  Extremities: Chronic stasis dermatitis changes in his legs.  He has some brawny edema.  He does have some compression stockings on.  LABORATORY STUDIES:  White cell count is 10.2, hemoglobin 12.2, hematocrit 38.3, platelet count 469,000.  MCV is 94.  IMPRESSION:  Mr. Romack is a 46 year old gentleman with multiple  issues. His obesity is probably the biggest factor for his prognosis.  I am glad that his anemia is improving.  I would not think that we would have to give him any iron right now.  I think the testosterone is something that is going to be important for him.  Given his size, I just do not see that he is going to be able to absorb anything topically.  We will go ahead with the testosterone today.  We will then plan to get him back monthly and see how he is feeling.  It would not surprise me if we needed to give him iron again.  One other possibility might be Aranesp, but I think we will hold off on this as long as possible.    ______________________________ Josph Macho, M.D. PRE/MEDQ  D:  04/08/2012  T:  04/09/2012  Job:  [id number]

## 2012-04-13 ENCOUNTER — Telehealth: Payer: Self-pay | Admitting: Hematology & Oncology

## 2012-04-13 ENCOUNTER — Telehealth: Payer: Self-pay | Admitting: *Deleted

## 2012-04-13 ENCOUNTER — Other Ambulatory Visit: Payer: Self-pay | Admitting: *Deleted

## 2012-04-13 DIAGNOSIS — D509 Iron deficiency anemia, unspecified: Secondary | ICD-10-CM

## 2012-04-13 NOTE — Telephone Encounter (Signed)
Called patient sister to let her know that patients iron levels are low and Dr. Lupita Leash would like for patient to come in to receive Feraheme 1020 mg. IV.  This was set up with scheduler

## 2012-04-13 NOTE — Telephone Encounter (Signed)
Message copied by Anselm Jungling on Tue Apr 13, 2012 11:04 AM ------      Message from: Arlan Organ R      Created: Mon Apr 12, 2012  7:09 AM       Call - Iron is still low.  Needs Feraheme 1020mg  x 1 dose.  Please set up.        Thanks!!!            Cindee Lame

## 2012-04-13 NOTE — Telephone Encounter (Signed)
Left message with Arline Asp to call for appointment for pt. Tammy had called but she is not on release of info. RN aware

## 2012-04-14 ENCOUNTER — Telehealth: Payer: Self-pay | Admitting: Hematology & Oncology

## 2012-04-14 NOTE — Telephone Encounter (Signed)
Pt aware of 11-20 iron appointment

## 2012-04-16 ENCOUNTER — Telehealth: Payer: Self-pay

## 2012-04-16 ENCOUNTER — Ambulatory Visit (INDEPENDENT_AMBULATORY_CARE_PROVIDER_SITE_OTHER): Payer: Medicare Other | Admitting: Physician Assistant

## 2012-04-16 ENCOUNTER — Encounter: Payer: Self-pay | Admitting: Physician Assistant

## 2012-04-16 ENCOUNTER — Other Ambulatory Visit: Payer: Self-pay | Admitting: Physician Assistant

## 2012-04-16 VITALS — BP 144/85 | HR 101 | Ht 73.0 in | Wt 380.4 lb

## 2012-04-16 DIAGNOSIS — I509 Heart failure, unspecified: Secondary | ICD-10-CM

## 2012-04-16 DIAGNOSIS — E8779 Other fluid overload: Secondary | ICD-10-CM

## 2012-04-16 DIAGNOSIS — R609 Edema, unspecified: Secondary | ICD-10-CM

## 2012-04-16 DIAGNOSIS — R601 Generalized edema: Secondary | ICD-10-CM

## 2012-04-16 DIAGNOSIS — R0602 Shortness of breath: Secondary | ICD-10-CM

## 2012-04-16 DIAGNOSIS — E8770 Fluid overload, unspecified: Secondary | ICD-10-CM

## 2012-04-16 DIAGNOSIS — I5032 Chronic diastolic (congestive) heart failure: Secondary | ICD-10-CM

## 2012-04-16 DIAGNOSIS — I10 Essential (primary) hypertension: Secondary | ICD-10-CM

## 2012-04-16 MED ORDER — TORSEMIDE 20 MG PO TABS: 20.0000 mg | ORAL_TABLET | Freq: Two times a day (BID) | ORAL | Status: DC

## 2012-04-16 NOTE — Assessment & Plan Note (Signed)
Adjustment of diuretic regimen, as outlined above. 2-D echocardiography for reassessment of LVF/RVF.

## 2012-04-16 NOTE — Telephone Encounter (Signed)
Auth # [id number] exp 05/31/12

## 2012-04-16 NOTE — Telephone Encounter (Signed)
2 D ECHO set for 04-19-12 Checking percert

## 2012-04-16 NOTE — Assessment & Plan Note (Signed)
Followed by primary M.D. 

## 2012-04-16 NOTE — Progress Notes (Signed)
Primary Cardiologist: Jerral Bonito, MD   HPI: Patient presents as an add-on today for evaluation of peripheral edema.  Patient last seen here in clinic in September, at which time I recommended review of most recent data and CXR results from the ED in [place], [place], where he had just been treated 2 days earlier with IV Lasix. He reported to me at that time that he was not diagnosed with CHF. These data are currently still unavailable.  Patient is closely followed by Dr. Arlan Organ, for management of iron deficiency anemia and hypotestosteronemia. He was most recently seen there for routine followup on November 7.  Patient has gained 15 pounds since his last OV here. He reports chronic DOE, orthopnea, but no PND. He sleeps in a hospital bed. He states that he has been on Lasix for years, and recalls our previous discussion about possibly switching him to [facility]  Allergies  Allergen Reactions  . Aspirin Other (See Comments)    Causes asthmatic symptoms  . Statins     Joint pain   . Penicillins Rash    Current Outpatient Prescriptions  Medication Sig Dispense Refill  . ABILIFY 5 MG tablet Take 10 mg by mouth daily.       Marland Kitchen acetaminophen (TYLENOL) 500 MG tablet Take 500 mg by mouth as needed. For pain       . albuterol (PROAIR HFA) 108 (90 BASE) MCG/ACT inhaler Inhale 2 puffs into the lungs every 4 (four) hours as needed.  1 each  0  . albuterol (PROVENTIL) (2.5 MG/3ML) 0.083% nebulizer solution Take 2.5 mg by nebulization as needed. For shortness of breath       . alprazolam (XANAX) 2 MG tablet Take 1-2 mg by mouth 4 (four) times daily.        . cephALEXin (KEFLEX) 500 MG capsule Take 500 mg by mouth 3 (three) times daily.       . Cholecalciferol (VITAMIN D-3) 5000 UNITS TABS Take 1 tablet by mouth daily.      . clonazePAM (KLONOPIN) 1 MG tablet Take 1 mg by mouth as needed.       . cloNIDine (CATAPRES) 0.1 MG tablet Take 0.1 mg by mouth daily.       Marland Kitchen dicyclomine (BENTYL) 20  MG tablet Take 1 tablet by mouth 4 times daily.      . diphenhydrAMINE (BENADRYL) 25 mg capsule Take 25 mg by mouth as needed. For allergies       . esomeprazole (NEXIUM) 40 MG capsule Take 40 mg by mouth at bedtime.        . fenofibrate micronized (LOFIBRA) 134 MG capsule Take 134 mg by mouth at bedtime.        . fish oil-omega-3 fatty acids 1000 MG capsule Take 1 g by mouth 2 (two) times daily.      Marland Kitchen HYDROcodone-acetaminophen (NORCO) 10-325 MG per tablet Take 1 tablet by mouth 5 (five) times daily as needed. For pain       . ibuprofen (ADVIL,MOTRIN) 600 MG tablet Take 600 mg by mouth every 6 (six) hours as needed.       Marland Kitchen imipramine (TOFRANIL) 50 MG tablet Take 50 mg by mouth at bedtime.       Marland Kitchen KLOR-CON M20 20 MEQ tablet TAKE ONE TABLET BY MOUTH ONE TIME DAILY  30 tablet  2  . lactulose (CHRONULAC) 10 GM/15ML solution Take 30 mLs by mouth Three times a day.      . levothyroxine (  SYNTHROID, LEVOTHROID) 100 MCG tablet Take 100 mcg by mouth every morning.        Marland Kitchen lisinopril (PRINIVIL,ZESTRIL) 10 MG tablet Take 10 mg by mouth daily.       . Magnesium 250 MG TABS Take 1 tablet by mouth daily.      . metoCLOPramide (REGLAN) 10 MG tablet Take 5 tablets by mouth as directed.      . morphine (MS CONTIN) 60 MG 12 hr tablet Take 60 mg by mouth every 12 (twelve) hours. As needed for pain      . ondansetron (ZOFRAN) 4 MG tablet 4 mg every 12 (twelve) hours as needed.       . polyethylene glycol powder (GLYCOLAX/MIRALAX) powder 17 g every other day.       . promethazine (PHENERGAN) 25 MG tablet Take 25 mg by mouth every 6 (six) hours as needed. For nausea       . spironolactone (ALDACTONE) 50 MG tablet Take 50 mg by mouth daily.       . temazepam (RESTORIL) 30 MG capsule Take 30 mg by mouth at bedtime as needed.      . testosterone cypionate (DEPOTESTOTERONE CYPIONATE) 200 MG/ML injection Inject into the muscle every 28 (twenty-eight) days.       Marland Kitchen tiZANidine (ZANAFLEX) 4 MG tablet 4 mg every 6 (six)  hours as needed.       . traZODone (DESYREL) 150 MG tablet Take 300 mg by mouth at bedtime.       Marland Kitchen venlafaxine (EFFEXOR) 75 MG tablet Take 75 mg by mouth 3 (three) times daily.        . vitamin B-12 (CYANOCOBALAMIN) 500 MCG tablet Take 500 mcg by mouth daily.      Marland Kitchen torsemide (DEMADEX) 20 MG tablet Take 1 tablet (20 mg total) by mouth 2 (two) times daily.  60 tablet  6    Past Medical History  Diagnosis Date  . Hypertension   . CHF (congestive heart failure)     Diastolic  . Arthritis   . Hyperlipemia   . Sleep apnea     He wore CPAP once  . Asthma   . Schizophrenia   . Hypothyroidism   . Bipolar 1 disorder   . Ejection fraction     EF 50-55%, March, 2012, technically difficult study, some RV dilatation  . Venous insufficiency   . Lithium toxicity     April, 2012  . Edema     Secondary to diastolic CHF  . Obesity   . Statin intolerance     Muscle aches from statins in the past    Past Surgical History  Procedure Date  . Tonsillectomy     History   Social History  . Marital Status: Married    Spouse Name: N/A    Number of Children: N/A  . Years of Education: N/A   Occupational History  . Not on file.   Social History Main Topics  . Smoking status: Current Every Day Smoker -- 1.5 packs/day for 20 years    Types: Cigarettes  . Smokeless tobacco: Never Used  . Alcohol Use: No  . Drug Use: Not on file  . Sexually Active: Not on file   Other Topics Concern  . Not on file   Social History Narrative  . No narrative on file    No family history on file.  ROS: no nausea, vomiting; no fever, chills; no melena, hematochezia; no claudication  PHYSICAL EXAM: BP 144/85  Pulse 101  Ht 6\' 1"  (1.854 m)  Wt 380 lb 6.4 oz (172.548 kg)  BMI 50.19 kg/m2 GENERAL: 46 year-old male, morbidly obese; NAD  HEENT: NCAT, PERRLA, EOMI; sclera clear; no xanthelasma  NECK: Unable to assess JVD, secondary to neck girth ; no TM  LUNGS:  diminished breath sounds  CARDIAC:  RRR (S1, S2); no significant murmurs; no rubs or gallops  ABDOMEN: Markedly protuberant  EXTREMETIES: 4+ bilateral peripheral edema, with associated erythema; small circular lesions about the toes, no obvious weeping; bilateral calf compression hose  SKIN: warm/dry; no obvious rash/lesions  MUSCULOSKELETAL: no joint deformity  NEURO: no focal deficit; NL affect    EKG:    ASSESSMENT & PLAN:  Volume overload Patient returns to clinic with persistent, marked volume overload and weight gain of approximately 15 pounds. Will substitute Lasix with Demadex 20 mg twice a day, and uptitrate as needed for aggressive diuresis. We'll order extensive baseline labs, including complete metabolic profile, BNP, and TSH levels. Will also order a PCXR. Will schedule return visit with me in 1 week, for reassessment of clinical status. We'll also order a 2-D echocardiogram for reassessment of LVF/RVF. Of note, given that patient is on Aldactone, will discontinue supplemental potassium.  Hypertension Followed by primary M.D.  CHF (congestive heart failure) Adjustment of diuretic regimen, as outlined above. 2-D echocardiography for reassessment of LVF/RVF.    Gene Ezana Hubbert, PAC

## 2012-04-16 NOTE — Assessment & Plan Note (Addendum)
Patient returns to clinic with persistent, marked volume overload and weight gain of approximately 15 pounds. Will substitute Lasix with Demadex 20 mg twice a day, and uptitrate as needed for aggressive diuresis. We'll order extensive baseline labs, including complete metabolic profile, BNP, and TSH levels. Will also order a PCXR. Will schedule return visit with me in 1 week, for reassessment of clinical status. We'll also order a 2-D echocardiogram for reassessment of LVF/RVF. Of note, given that patient is on Aldactone, will discontinue supplemental potassium.

## 2012-04-16 NOTE — Patient Instructions (Signed)
   Labs - today - CMET, BNP, TSH & Chest x-ray   Stop Lasix  Change to Demadex (Torsemide) 20mg  twice a day   Stop K-Dur (potassium)  Echo  Office will contact with results Continue all other current medications. Follow up in  1 week

## 2012-04-19 DIAGNOSIS — I509 Heart failure, unspecified: Secondary | ICD-10-CM

## 2012-04-20 ENCOUNTER — Telehealth: Payer: Self-pay | Admitting: *Deleted

## 2012-04-20 NOTE — Telephone Encounter (Signed)
Message copied by Lesle Chris on Tue Apr 20, 2012  1:21 PM ------      Message from: Rande Brunt      Created: Tue Apr 20, 2012 10:09 AM       NL echo

## 2012-04-20 NOTE — Telephone Encounter (Signed)
Notes Recorded by Lesle Chris, LPN on 16/03/9603 at 1:21 PM Patient notified. ------  Notes Recorded by Lesle Chris, LPN on 54/01/8118 at 10:55 AM Left message to return call.

## 2012-04-21 ENCOUNTER — Ambulatory Visit (HOSPITAL_BASED_OUTPATIENT_CLINIC_OR_DEPARTMENT_OTHER): Payer: Medicare Other

## 2012-04-21 VITALS — BP 117/63 | HR 93 | Temp 96.8°F | Resp 24

## 2012-04-21 DIAGNOSIS — E291 Testicular hypofunction: Secondary | ICD-10-CM

## 2012-04-21 DIAGNOSIS — E349 Endocrine disorder, unspecified: Secondary | ICD-10-CM

## 2012-04-21 DIAGNOSIS — D509 Iron deficiency anemia, unspecified: Secondary | ICD-10-CM

## 2012-04-21 MED ORDER — TESTOSTERONE CYPIONATE 200 MG/ML IM SOLN
400.0000 mg | INTRAMUSCULAR | Status: DC
Start: 2012-04-21 — End: 2012-04-21
  Administered 2012-04-21: 400 mg via INTRAMUSCULAR

## 2012-04-22 ENCOUNTER — Ambulatory Visit: Payer: Medicare Other | Admitting: Physician Assistant

## 2012-04-26 ENCOUNTER — Ambulatory Visit: Payer: Medicare Other | Admitting: Physician Assistant

## 2012-05-05 ENCOUNTER — Other Ambulatory Visit: Payer: Self-pay

## 2012-05-06 ENCOUNTER — Ambulatory Visit: Payer: Medicare Other | Admitting: Medical

## 2012-05-06 ENCOUNTER — Telehealth: Payer: Self-pay | Admitting: Hematology & Oncology

## 2012-05-06 ENCOUNTER — Other Ambulatory Visit: Payer: Medicare Other | Admitting: Lab

## 2012-05-06 NOTE — Telephone Encounter (Signed)
Patient's wife called and cx 05/06/12 apt and resch for 06/12/11.  Darl Pikes was notified of cx apt

## 2012-05-27 ENCOUNTER — Encounter: Payer: Self-pay | Admitting: Cardiology

## 2012-05-27 NOTE — Progress Notes (Signed)
   I received a faxed note from [facility] care management stating that the patient's torsemide dose had been increased to 40 twice a day with Plans for the patient to be seen several days later. It was then noted that the patient would be seeing her primary physician on December 30 and that chemistry would be requested at that time.

## 2012-06-08 ENCOUNTER — Telehealth: Payer: Self-pay | Admitting: Hematology & Oncology

## 2012-06-08 NOTE — Telephone Encounter (Signed)
Patient's wife called and cx 06/11/12 apt and resch for 06/15/12

## 2012-06-10 ENCOUNTER — Telehealth: Payer: Self-pay | Admitting: Physician Assistant

## 2012-06-10 NOTE — Telephone Encounter (Signed)
Informed wife that message will be sent to PA for his review.  Also, we will request last OV notes from PMD regarding this current issue.

## 2012-06-10 NOTE — Telephone Encounter (Signed)
Wife notified of below.  She verbalized understanding.

## 2012-06-10 NOTE — Telephone Encounter (Signed)
WIFE FEELS THAt he husband may need to be admitted to have fluid drawn off.   Said that his legs are extremely swollen and hurting him.  Would like for someone to contact her to see if he needs to come in and see Gene or go to hosp

## 2012-06-10 NOTE — Telephone Encounter (Signed)
I reviewed pt's recent OV with me, April 16, 2012. Although he did return for a 2D echo, which revealed NL LV and RV function, he did not proceed with recommended labs and CXR. He also did not f/u with me in 1 week, as recommended. Therefore, I suggest he return to his primary care provider for further recommendations.

## 2012-06-11 ENCOUNTER — Ambulatory Visit: Payer: Medicare Other | Admitting: Medical

## 2012-06-11 ENCOUNTER — Other Ambulatory Visit: Payer: Medicare Other | Admitting: Lab

## 2012-06-15 ENCOUNTER — Other Ambulatory Visit: Payer: Medicare Other | Admitting: Lab

## 2012-06-15 ENCOUNTER — Ambulatory Visit: Payer: Medicare Other | Admitting: Medical

## 2012-06-15 ENCOUNTER — Telehealth: Payer: Self-pay | Admitting: Hematology & Oncology

## 2012-06-15 NOTE — Telephone Encounter (Signed)
Pt cx 1-14 said would call back to reschedule. Said he has fluid on his legs he needed to get off first RN and MD aware

## 2012-06-22 ENCOUNTER — Telehealth: Payer: Self-pay | Admitting: Cardiology

## 2012-06-22 NOTE — Telephone Encounter (Signed)
[place] pt. 

## 2012-06-22 NOTE — Telephone Encounter (Signed)
They have had ineffective diuresis with meds he is on and labs are ok but pt is having nausea and having headaches and she would like him to go on furosemide bid please give her a call

## 2012-06-23 NOTE — Telephone Encounter (Signed)
Spoke with Guy Sandifer, nurse case mgr. - states that she wanted to change his Torsemide to Furosemide.  Informed her of recent phone contact with patient & that he has not followed up as we have asked him to.  Our providers will not make further suggestions about his care until patient is seen and evaluated back in office.  Also, informed her of the numerous cancelled appointments that he has had recently.  Nurse mgr states she will go ahead and make the change and advise him on making follow up here in our office.

## 2012-08-02 ENCOUNTER — Inpatient Hospital Stay (HOSPITAL_COMMUNITY): Payer: Medicare Other

## 2012-08-02 ENCOUNTER — Encounter (HOSPITAL_COMMUNITY): Payer: Self-pay | Admitting: Emergency Medicine

## 2012-08-02 ENCOUNTER — Inpatient Hospital Stay (HOSPITAL_COMMUNITY)
Admission: EM | Admit: 2012-08-02 | Discharge: 2012-08-06 | DRG: 683 | Disposition: A | Discharge status: Home / Self Care | Payer: Medicare Other | Attending: Internal Medicine | Admitting: Internal Medicine

## 2012-08-02 ENCOUNTER — Emergency Department (HOSPITAL_COMMUNITY): Payer: Medicare Other

## 2012-08-02 DIAGNOSIS — T502X5A Adverse effect of carbonic-anhydrase inhibitors, benzothiadiazides and other diuretics, initial encounter: Secondary | ICD-10-CM | POA: Diagnosis present

## 2012-08-02 DIAGNOSIS — I959 Hypotension, unspecified: Secondary | ICD-10-CM

## 2012-08-02 DIAGNOSIS — R188 Other ascites: Secondary | ICD-10-CM

## 2012-08-02 DIAGNOSIS — E871 Hypo-osmolality and hyponatremia: Secondary | ICD-10-CM

## 2012-08-02 DIAGNOSIS — E662 Morbid (severe) obesity with alveolar hypoventilation: Secondary | ICD-10-CM | POA: Diagnosis present

## 2012-08-02 DIAGNOSIS — Z6841 Body Mass Index (BMI) 40.0 and over, adult: Secondary | ICD-10-CM

## 2012-08-02 DIAGNOSIS — D649 Anemia, unspecified: Secondary | ICD-10-CM

## 2012-08-02 DIAGNOSIS — F17200 Nicotine dependence, unspecified, uncomplicated: Secondary | ICD-10-CM | POA: Diagnosis present

## 2012-08-02 DIAGNOSIS — D509 Iron deficiency anemia, unspecified: Secondary | ICD-10-CM

## 2012-08-02 DIAGNOSIS — F209 Schizophrenia, unspecified: Secondary | ICD-10-CM

## 2012-08-02 DIAGNOSIS — G894 Chronic pain syndrome: Secondary | ICD-10-CM

## 2012-08-02 DIAGNOSIS — I872 Venous insufficiency (chronic) (peripheral): Secondary | ICD-10-CM | POA: Diagnosis present

## 2012-08-02 DIAGNOSIS — F319 Bipolar disorder, unspecified: Secondary | ICD-10-CM

## 2012-08-02 DIAGNOSIS — I5032 Chronic diastolic (congestive) heart failure: Secondary | ICD-10-CM | POA: Diagnosis present

## 2012-08-02 DIAGNOSIS — R110 Nausea: Secondary | ICD-10-CM

## 2012-08-02 DIAGNOSIS — E669 Obesity, unspecified: Secondary | ICD-10-CM

## 2012-08-02 DIAGNOSIS — G4733 Obstructive sleep apnea (adult) (pediatric): Secondary | ICD-10-CM | POA: Diagnosis present

## 2012-08-02 DIAGNOSIS — E039 Hypothyroidism, unspecified: Secondary | ICD-10-CM

## 2012-08-02 DIAGNOSIS — I517 Cardiomegaly: Secondary | ICD-10-CM

## 2012-08-02 DIAGNOSIS — T3995XA Adverse effect of unspecified nonopioid analgesic, antipyretic and antirheumatic, initial encounter: Secondary | ICD-10-CM | POA: Diagnosis present

## 2012-08-02 DIAGNOSIS — N170 Acute kidney failure with tubular necrosis: Principal | ICD-10-CM

## 2012-08-02 DIAGNOSIS — G4730 Sleep apnea, unspecified: Secondary | ICD-10-CM

## 2012-08-02 DIAGNOSIS — K760 Fatty (change of) liver, not elsewhere classified: Secondary | ICD-10-CM

## 2012-08-02 DIAGNOSIS — T56894A Toxic effect of other metals, undetermined, initial encounter: Secondary | ICD-10-CM

## 2012-08-02 DIAGNOSIS — N320 Bladder-neck obstruction: Secondary | ICD-10-CM | POA: Diagnosis present

## 2012-08-02 DIAGNOSIS — I519 Heart disease, unspecified: Secondary | ICD-10-CM | POA: Diagnosis present

## 2012-08-02 DIAGNOSIS — E860 Dehydration: Secondary | ICD-10-CM

## 2012-08-02 DIAGNOSIS — N179 Acute kidney failure, unspecified: Secondary | ICD-10-CM

## 2012-08-02 DIAGNOSIS — I509 Heart failure, unspecified: Secondary | ICD-10-CM | POA: Diagnosis present

## 2012-08-02 DIAGNOSIS — K5909 Other constipation: Secondary | ICD-10-CM

## 2012-08-02 DIAGNOSIS — E785 Hyperlipidemia, unspecified: Secondary | ICD-10-CM

## 2012-08-02 DIAGNOSIS — E875 Hyperkalemia: Secondary | ICD-10-CM

## 2012-08-02 DIAGNOSIS — K589 Irritable bowel syndrome without diarrhea: Secondary | ICD-10-CM | POA: Diagnosis present

## 2012-08-02 DIAGNOSIS — J45909 Unspecified asthma, uncomplicated: Secondary | ICD-10-CM

## 2012-08-02 DIAGNOSIS — Z79899 Other long term (current) drug therapy: Secondary | ICD-10-CM

## 2012-08-02 DIAGNOSIS — D72829 Elevated white blood cell count, unspecified: Secondary | ICD-10-CM

## 2012-08-02 DIAGNOSIS — T448X5A Adverse effect of centrally-acting and adrenergic-neuron-blocking agents, initial encounter: Secondary | ICD-10-CM | POA: Diagnosis present

## 2012-08-02 DIAGNOSIS — R4182 Altered mental status, unspecified: Secondary | ICD-10-CM

## 2012-08-02 DIAGNOSIS — R339 Retention of urine, unspecified: Secondary | ICD-10-CM

## 2012-08-02 DIAGNOSIS — T5691XA Toxic effect of unspecified metal, accidental (unintentional), initial encounter: Secondary | ICD-10-CM

## 2012-08-02 LAB — PRO B NATRIURETIC PEPTIDE: Pro B Natriuretic peptide (BNP): 238.3 pg/mL — ABNORMAL HIGH (ref 0–125)

## 2012-08-02 LAB — CBC WITH DIFFERENTIAL/PLATELET
Basophils Absolute: 0.2 10*3/uL — ABNORMAL HIGH (ref 0.0–0.1)
Basophils Absolute: 0 10*3/uL (ref 0.0–0.1)
Basophils Relative: 0 % (ref 0–1)
Eosinophils Absolute: 0.2 10*3/uL (ref 0.0–0.7)
Eosinophils Absolute: 0.3 10*3/uL (ref 0.0–0.7)
Eosinophils Relative: 1 % (ref 0–5)
Eosinophils Relative: 2 % (ref 0–5)
HCT: 27 % — ABNORMAL LOW (ref 39.0–52.0)
MCH: 28.9 pg (ref 26.0–34.0)
MCH: 28.8 pg (ref 26.0–34.0)
MCHC: 33.6 g/dL (ref 30.0–36.0)
MCHC: 33.3 g/dL (ref 30.0–36.0)
MCV: 86.2 fL (ref 78.0–100.0)
Metamyelocytes Relative: 0 %
Monocytes Absolute: 1.5 10*3/uL — ABNORMAL HIGH (ref 0.1–1.0)
Myelocytes: 0 %
Neutro Abs: 10.8 10*3/uL — ABNORMAL HIGH (ref 1.7–7.7)
Neutrophils Relative %: 76 % (ref 43–77)
Platelets: 408 10*3/uL — ABNORMAL HIGH (ref 150–400)
Promyelocytes Absolute: 0 %
RBC: 3.49 MIL/uL — ABNORMAL LOW (ref 4.22–5.81)
RDW: 15.4 % (ref 11.5–15.5)
nRBC: 0 /100 WBC

## 2012-08-02 LAB — COMPREHENSIVE METABOLIC PANEL
ALT: 13 U/L (ref 0–53)
AST: 18 U/L (ref 0–37)
Albumin: 4.9 g/dL (ref 3.5–5.2)
Alkaline Phosphatase: 40 U/L (ref 39–117)
BUN: 85 mg/dL — ABNORMAL HIGH (ref 6–23)
CO2: 28 mEq/L (ref 19–32)
Chloride: 71 mEq/L — ABNORMAL LOW (ref 96–112)
Chloride: 78 mEq/L — ABNORMAL LOW (ref 96–112)
Creatinine, Ser: 6.71 mg/dL — ABNORMAL HIGH (ref 0.50–1.35)
GFR calc non Af Amer: 9 mL/min — ABNORMAL LOW (ref >90)
Glucose, Bld: 92 mg/dL (ref 70–99)
Potassium: 5.5 mEq/L — ABNORMAL HIGH (ref 3.5–5.1)
Sodium: 119 mEq/L — CL (ref 135–145)
Total Bilirubin: 0.3 mg/dL (ref 0.3–1.2)
Total Bilirubin: 0.3 mg/dL (ref 0.3–1.2)
Total Protein: 8.8 g/dL — ABNORMAL HIGH (ref 6.0–8.3)

## 2012-08-02 LAB — URINALYSIS, MICROSCOPIC ONLY
Bilirubin Urine: NEGATIVE
Ketones, ur: NEGATIVE mg/dL
Nitrite: NEGATIVE
Protein, ur: 30 mg/dL — AB
Urobilinogen, UA: 0.2 mg/dL (ref 0.0–1.0)
pH: 6 (ref 5.0–8.0)

## 2012-08-02 LAB — LITHIUM LEVEL: Lithium Lvl: <0.25 mEq/L — ABNORMAL LOW (ref 0.80–1.40)

## 2012-08-02 LAB — RAPID URINE DRUG SCREEN, HOSP PERFORMED
Cocaine: NOT DETECTED
Opiates: POSITIVE — AB

## 2012-08-02 LAB — PROTIME-INR: INR: 1.06 (ref 0.00–1.49)

## 2012-08-02 LAB — LACTIC ACID, PLASMA: Lactic Acid, Venous: 0.8 mmol/L (ref 0.5–2.2)

## 2012-08-02 LAB — PHOSPHORUS: Phosphorus: 9.3 mg/dL — ABNORMAL HIGH (ref 2.3–4.6)

## 2012-08-02 LAB — URINALYSIS, ROUTINE W REFLEX MICROSCOPIC
Glucose, UA: 100 mg/dL — AB
Specific Gravity, Urine: 1.011 (ref 1.005–1.030)

## 2012-08-02 LAB — LIPASE, BLOOD: Lipase: 79 U/L — ABNORMAL HIGH (ref 11–59)

## 2012-08-02 LAB — MAGNESIUM: Magnesium: 2.4 mg/dL (ref 1.5–2.5)

## 2012-08-02 LAB — BASIC METABOLIC PANEL
BUN: 85 mg/dL — ABNORMAL HIGH (ref 6–23)
CO2: 29 mEq/L (ref 19–32)
Chloride: 77 mEq/L — ABNORMAL LOW (ref 96–112)
Creatinine, Ser: 6.83 mg/dL — ABNORMAL HIGH (ref 0.50–1.35)

## 2012-08-02 LAB — CREATININE, URINE, RANDOM: Creatinine, Urine: 42.97 mg/dL

## 2012-08-02 LAB — TROPONIN I: Troponin I: <0.3 ng/mL (ref <0.30)

## 2012-08-02 MED ORDER — ALPRAZOLAM 0.25 MG PO TABS
1.0000 mg | ORAL_TABLET | Freq: Four times a day (QID) | ORAL | Status: DC | PRN
  Administered 2012-08-03 – 2012-08-04 (×3): 2 mg via ORAL
  Administered 2012-08-04 – 2012-08-05 (×5): 1 mg via ORAL
  Administered 2012-08-05: 2 mg via ORAL
  Administered 2012-08-06: 1 mg via ORAL
  Filled 2012-08-02: qty 6
  Filled 2012-08-02: qty 8
  Filled 2012-08-02 (×3): qty 4
  Filled 2012-08-02: qty 8
  Filled 2012-08-02 (×2): qty 4
  Filled 2012-08-02: qty 8
  Filled 2012-08-02: qty 1
  Filled 2012-08-02: qty 2

## 2012-08-02 MED ORDER — ACETAMINOPHEN 325 MG PO TABS: 650.0000 mg | ORAL_TABLET | Freq: Four times a day (QID) | ORAL | Status: DC | PRN

## 2012-08-02 MED ORDER — LEVOTHYROXINE SODIUM 100 MCG PO TABS
100.0000 ug | ORAL_TABLET | ORAL | Status: DC
  Administered 2012-08-03 – 2012-08-06 (×4): 100 ug via ORAL
  Filled 2012-08-02 (×5): qty 1

## 2012-08-02 MED ORDER — METOCLOPRAMIDE HCL 10 MG PO TABS
10.0000 mg | ORAL_TABLET | Freq: Four times a day (QID) | ORAL | Status: DC | PRN
Start: 2012-08-02 — End: 2012-08-06
  Administered 2012-08-05 – 2012-08-06 (×3): 10 mg via ORAL
  Filled 2012-08-02: qty 1

## 2012-08-02 MED ORDER — SODIUM CHLORIDE 0.9 % IV SOLN: INTRAVENOUS | Status: DC

## 2012-08-02 MED ORDER — IMIPRAMINE HCL 50 MG PO TABS
50.0000 mg | ORAL_TABLET | Freq: Every day | ORAL | Status: DC
  Administered 2012-08-02 – 2012-08-05 (×4): 50 mg via ORAL
  Filled 2012-08-02 (×5): qty 1

## 2012-08-02 MED ORDER — ONDANSETRON HCL 4 MG/2ML IJ SOLN
4.0000 mg | Freq: Once | INTRAMUSCULAR | Status: AC
  Administered 2012-08-02: 4 mg via INTRAVENOUS
  Filled 2012-08-02: qty 2

## 2012-08-02 MED ORDER — IOHEXOL 300 MG/ML  SOLN
25.0000 mL | INTRAMUSCULAR | Status: AC
  Administered 2012-08-02 (×2): 25 mL via ORAL

## 2012-08-02 MED ORDER — ONDANSETRON HCL 4 MG/2ML IJ SOLN
4.0000 mg | Freq: Four times a day (QID) | INTRAMUSCULAR | Status: DC | PRN
  Administered 2012-08-03 – 2012-08-06 (×5): 4 mg via INTRAVENOUS
  Filled 2012-08-02 (×4): qty 2

## 2012-08-02 MED ORDER — MORPHINE SULFATE ER 30 MG PO TBCR
60.0000 mg | EXTENDED_RELEASE_TABLET | Freq: Two times a day (BID) | ORAL | Status: DC
  Administered 2012-08-02 – 2012-08-06 (×8): 60 mg via ORAL
  Filled 2012-08-02: qty 4
  Filled 2012-08-02: qty 2
  Filled 2012-08-02: qty 4
  Filled 2012-08-02 (×2): qty 2
  Filled 2012-08-02: qty 4
  Filled 2012-08-02: qty 2
  Filled 2012-08-02: qty 4

## 2012-08-02 MED ORDER — LACTULOSE 10 GM/15ML PO SOLN
20.0000 g | Freq: Three times a day (TID) | ORAL | Status: DC | PRN
  Administered 2012-08-03: 20 g via ORAL
  Filled 2012-08-02: qty 30

## 2012-08-02 MED ORDER — TIZANIDINE HCL 4 MG PO TABS
4.0000 mg | ORAL_TABLET | Freq: Three times a day (TID) | ORAL | Status: DC | PRN
  Administered 2012-08-04: 8 mg via ORAL
  Administered 2012-08-05: 4 mg via ORAL
  Filled 2012-08-02 (×3): qty 2

## 2012-08-02 MED ORDER — ACETAMINOPHEN 650 MG RE SUPP: 650.0000 mg | Freq: Four times a day (QID) | RECTAL | Status: DC | PRN

## 2012-08-02 MED ORDER — HYDROCODONE-ACETAMINOPHEN 10-325 MG PO TABS
1.0000 | ORAL_TABLET | Freq: Four times a day (QID) | ORAL | Status: DC | PRN
  Administered 2012-08-04 – 2012-08-06 (×4): 1 via ORAL
  Filled 2012-08-02 (×4): qty 1

## 2012-08-02 MED ORDER — PANTOPRAZOLE SODIUM 40 MG PO TBEC
40.0000 mg | DELAYED_RELEASE_TABLET | Freq: Every day | ORAL | Status: DC
  Administered 2012-08-03 – 2012-08-06 (×4): 40 mg via ORAL
  Filled 2012-08-02 (×4): qty 1

## 2012-08-02 MED ORDER — LEVOFLOXACIN IN D5W 750 MG/150ML IV SOLN
750.0000 mg | Freq: Once | INTRAVENOUS | Status: AC
  Administered 2012-08-03: 750 mg via INTRAVENOUS
  Filled 2012-08-02 (×2): qty 150

## 2012-08-02 MED ORDER — SODIUM CHLORIDE 0.9 % IV SOLN
INTRAVENOUS | Status: DC
  Administered 2012-08-02 – 2012-08-06 (×8): via INTRAVENOUS

## 2012-08-02 MED ORDER — VANCOMYCIN HCL 10 G IV SOLR
2500.0000 mg | Freq: Once | INTRAVENOUS | Status: AC
  Administered 2012-08-02: 2500 mg via INTRAVENOUS
  Filled 2012-08-02: qty 2500

## 2012-08-02 MED ORDER — FENOFIBRATE 160 MG PO TABS
160.0000 mg | ORAL_TABLET | Freq: Every day | ORAL | Status: DC
  Administered 2012-08-03 – 2012-08-06 (×4): 160 mg via ORAL
  Filled 2012-08-02 (×4): qty 1

## 2012-08-02 MED ORDER — SODIUM CHLORIDE 0.9 % IV BOLUS (SEPSIS)
500.0000 mL | Freq: Once | INTRAVENOUS | Status: AC
  Administered 2012-08-02: 500 mL via INTRAVENOUS

## 2012-08-02 MED ORDER — ONDANSETRON HCL 4 MG PO TABS
4.0000 mg | ORAL_TABLET | Freq: Four times a day (QID) | ORAL | Status: DC | PRN
  Administered 2012-08-05: 4 mg via ORAL
  Filled 2012-08-02: qty 1

## 2012-08-02 MED ORDER — SODIUM CHLORIDE 0.9 % IV SOLN
Freq: Once | INTRAVENOUS | Status: AC
  Administered 2012-08-02: 16:00:00 via INTRAVENOUS

## 2012-08-02 MED ORDER — SODIUM CHLORIDE 0.9 % IJ SOLN
3.0000 mL | Freq: Two times a day (BID) | INTRAMUSCULAR | Status: DC
  Administered 2012-08-02 – 2012-08-04 (×4): 3 mL via INTRAVENOUS

## 2012-08-02 MED ORDER — SODIUM CHLORIDE 0.9 % IV SOLN: Freq: Once | INTRAVENOUS | Status: DC

## 2012-08-02 MED ORDER — VANCOMYCIN HCL 10 G IV SOLR
2000.0000 mg | INTRAVENOUS | Status: DC
  Filled 2012-08-02: qty 2000

## 2012-08-02 MED ORDER — LEVOFLOXACIN IN D5W 500 MG/100ML IV SOLN
500.0000 mg | INTRAVENOUS | Status: DC
  Filled 2012-08-02: qty 100

## 2012-08-02 MED ORDER — MORPHINE SULFATE CR 60 MG PO TB12: 60.0000 mg | ORAL_TABLET | Freq: Two times a day (BID) | ORAL | Status: DC

## 2012-08-02 MED ORDER — DICYCLOMINE HCL 20 MG PO TABS
20.0000 mg | ORAL_TABLET | Freq: Three times a day (TID) | ORAL | Status: DC
  Administered 2012-08-03 – 2012-08-04 (×6): 20 mg via ORAL
  Filled 2012-08-02 (×11): qty 1

## 2012-08-02 MED ORDER — ALBUTEROL SULFATE HFA 108 (90 BASE) MCG/ACT IN AERS
2.0000 | INHALATION_SPRAY | RESPIRATORY_TRACT | Status: DC | PRN
  Filled 2012-08-02: qty 6.7

## 2012-08-02 MED ORDER — TRAZODONE HCL 150 MG PO TABS
300.0000 mg | ORAL_TABLET | Freq: Every day | ORAL | Status: DC
  Administered 2012-08-03 – 2012-08-05 (×3): 300 mg via ORAL
  Filled 2012-08-02 (×5): qty 2

## 2012-08-02 MED ORDER — ARIPIPRAZOLE 5 MG PO TABS
5.0000 mg | ORAL_TABLET | Freq: Every day | ORAL | Status: DC
  Administered 2012-08-02 – 2012-08-05 (×4): 5 mg via ORAL
  Filled 2012-08-02 (×5): qty 1

## 2012-08-02 MED ORDER — VENLAFAXINE HCL 75 MG PO TABS
75.0000 mg | ORAL_TABLET | Freq: Every day | ORAL | Status: DC
  Administered 2012-08-03 – 2012-08-06 (×4): 75 mg via ORAL
  Filled 2012-08-02 (×4): qty 1

## 2012-08-02 MED ORDER — MORPHINE SULFATE 2 MG/ML IJ SOLN
1.0000 mg | INTRAMUSCULAR | Status: DC | PRN
  Administered 2012-08-06 (×2): 1 mg via INTRAVENOUS
  Filled 2012-08-02 (×2): qty 1

## 2012-08-02 NOTE — ED Notes (Signed)
Pt arrives to ed [facility] c/o nausea onset last Wednesday.  ems sts pt was in outdoor shed since last Wednesday without heat, wife providing food/drink. ems sts pt not had bowel movement in 5 days.  ems sts voimited one time on Friday.  Pt has appt with GI doc Evette Cristal tomorrow. Pt caox4, pmsx4. nad.

## 2012-08-02 NOTE — Consult Note (Signed)
PULMONARY  / CRITICAL CARE MEDICINE  Name: [name] MRN: [id number] DOB: 29-Mar-1966    ADMISSION DATE:  08/02/2012 CONSULTATION DATE:  3/3  REFERRING MD :  Rancour  CHIEF COMPLAINT:  Nausea/Constipation   BRIEF PATIENT DESCRIPTION: 47 y/o with history of HTN, HLD, athsma, dyastolic heart failure, OSA, hypothyroidism, schizophrenia, depression, and  Bipolar 1 disorder.  Patient arrived via EMS 3/3 with complaint of nausea and constipation for ~ month and had 2 episodes of vomiting over the weekend.  His home care nurse called EMS this morning his blood pressure was apparently in the 80s systolic.  Complains of abdominal distention and pain. Patient states his last bowel movement was 5 days ago. He denied any chest pain, shortness of breath, cough or fever.   SIGNIFICANT EVENTS / STUDIES:    LINES / TUBES: PIV 3/3>>>  CULTURES: Bloodx2 3/3>>>  ANTIBIOTICS: None   PAST MEDICAL HISTORY :  Past Medical History  Diagnosis Date  . Hypertension   . CHF (congestive heart failure)     Diastolic  . Arthritis   . Hyperlipemia   . Sleep apnea     He wore CPAP once  . Asthma   . Schizophrenia   . Hypothyroidism   . Bipolar 1 disorder   . Ejection fraction     EF 50-55%, March, 2012, technically difficult study, some RV dilatation  . Venous insufficiency   . Lithium toxicity     April, 2012  . Edema     Secondary to diastolic CHF  . Obesity   . Statin intolerance     Muscle aches from statins in the past   Past Surgical History  Procedure Laterality Date  . Tonsillectomy     Prior to Admission medications   Medication Sig Start Date End Date Taking? Authorizing Hammond Obeirne  ABILIFY 5 MG tablet Take 5 mg by mouth at bedtime.  04/12/12  Yes Historical Bonner Larue, MD  albuterol (PROVENTIL HFA;VENTOLIN HFA) 108 (90 BASE) MCG/ACT inhaler Inhale 2 puffs into the lungs every 4 (four) hours as needed for wheezing.   Yes Historical Fenna Semel, MD  alprazolam Prudy Feeler) 2 MG tablet Take  1-2 mg by mouth 4 (four) times daily as needed for anxiety.    Yes Historical Ruari Duggan, MD  dicyclomine (BENTYL) 20 MG tablet Take 1 tablet by mouth 4 times daily. 09/23/11  Yes Historical Kanaan Kagawa, MD  esomeprazole (NEXIUM) 40 MG capsule Take 40 mg by mouth at bedtime.     Yes Historical Annalynne Ibanez, MD  fenofibrate 160 MG tablet Take 160 mg by mouth daily.   Yes Historical Chanci Ojala, MD  furosemide (LASIX) 80 MG tablet Take 80 mg by mouth 2 (two) times daily.   Yes Historical Tykel Badie, MD  HYDROcodone-acetaminophen (NORCO) 10-325 MG per tablet Take 1 tablet by mouth every 6 (six) hours as needed for pain. For pain   Yes Historical Masai Kidd, MD  imipramine (TOFRANIL) 50 MG tablet Take 50 mg by mouth at bedtime.  04/11/12  Yes Historical Deandra Goering, MD  lactulose (CHRONULAC) 10 GM/15ML solution Take 30 mLs by mouth 3 (three) times daily as needed (for constipation).  09/23/11  Yes Historical Alahni Varone, MD  levothyroxine (SYNTHROID, LEVOTHROID) 100 MCG tablet Take 100 mcg by mouth every morning.     Yes Historical Josearmando Kuhnert, MD  lisinopril (PRINIVIL,ZESTRIL) 10 MG tablet Take 10 mg by mouth daily.  02/15/12  Yes Historical Zev Blue, MD  metoCLOPramide (REGLAN) 10 MG tablet Take 10 mg by mouth 4 (four) times daily as needed (  for stomach pain).  09/27/11  Yes Historical Sharol Croghan, MD  metolazone (ZAROXOLYN) 2.5 MG tablet Take 2.5 mg by mouth daily.   Yes Historical Amire Leazer, MD  morphine (MS CONTIN) 60 MG 12 hr tablet Take 60 mg by mouth every 12 (twelve) hours. As needed for pain   Yes Historical Lateasha Breuer, MD  ondansetron (ZOFRAN) 4 MG tablet Take 4 mg by mouth every 8 (eight) hours as needed for nausea.  02/05/12  Yes Historical Lyndell Gillyard, MD  polyethylene glycol powder (GLYCOLAX/MIRALAX) powder Take 17 g by mouth daily as needed (for constipation).  02/05/12  Yes Historical Brindle Leyba, MD  potassium chloride SA (K-DUR,KLOR-CON) 20 MEQ tablet Take 20 mEq by mouth daily.   Yes Historical Khadijatou Borak, MD  spironolactone  (ALDACTONE) 50 MG tablet Take 50 mg by mouth daily.  02/04/12  Yes Historical Cythia Bachtel, MD  tiZANidine (ZANAFLEX) 4 MG tablet Take 4-8 mg by mouth every 8 (eight) hours as needed (for muscle spasm).  01/19/12  Yes Historical Yuan Gann, MD  traZODone (DESYREL) 150 MG tablet Take 300 mg by mouth at bedtime.  04/11/12  Yes Historical Dawnette Mione, MD  venlafaxine (EFFEXOR) 75 MG tablet Take 75 mg by mouth daily.    Yes Historical Izzabella Besse, MD   Allergies  Allergen Reactions  . Aspirin Other (See Comments)    Causes asthmatic symptoms  . Statins     Joint pain   . Penicillins Rash    FAMILY HISTORY:  No family history on file. SOCIAL HISTORY:  reports that he has been smoking Cigarettes.  He has a 30 pack-year smoking history. He has never used smokeless tobacco. He reports that he does not drink alcohol. His drug history is not on file.  REVIEW OF SYSTEMS:   Constitutional: Negative for fever, chills, weight loss, malaise/fatigue and diaphoresis.  HENT: Negative for hearing loss, ear pain, nosebleeds, congestion, sore throat, neck pain, tinnitus and ear discharge.   Eyes: Negative for blurred vision, double vision, photophobia, pain, discharge and redness.  Respiratory: Negative for cough, hemoptysis, sputum production, shortness of breath, wheezing and stridor.   Cardiovascular: Negative for chest pain, palpitations, orthopnea, claudication, leg swelling and PND.  Gastrointestinal: Negative for heartburn, nausea, vomiting, abdominal pain, diarrhea, constipation, blood in stool and melena.  Genitourinary: Negative for dysuria, urgency, frequency, hematuria and flank pain.  Musculoskeletal: Negative for myalgias, back pain, joint pain and falls.  Skin: Negative for itching and rash.  Neurological: Negative for dizziness, tingling, tremors, sensory change, speech change, focal weakness, seizures, loss of consciousness, weakness and headaches.  Endo/Heme/Allergies: Negative for environmental  allergies and polydipsia. Does not bruise/bleed easily.  SUBJECTIVE:   VITAL SIGNS: Temp:  [97.9 F (36.6 C)] 97.9 F (36.6 C) (03/03 1213) Pulse Rate:  [70-86] 86 (03/03 1352) Resp:  [19] 19 (03/03 1213) BP: (84-136)/(29-101) 84/49 mmHg (03/03 1352) SpO2:  [93 %] 93 % (03/03 1213)  PHYSICAL EXAMINATION: General:  Ill appearing middle aged male appears in mild distress Neuro:  A&Ox4, no focal deficits, 5/5 strength throughout  HEENT:  EOMs intact pupils PERRL, dry MM Neck:  Large neck, no JVD Cardiovascular:  S1S2 distant, no mrg Lungs:  Clear, diminished in lower bases Abdomen:  Hypoactive bowel sounds, distended, tender to palpation throughout. Musculoskeletal:  Intact Skin:  Superficial breakdown between inner thighs   Recent Labs Lab 08/02/12 1255  NA 119*  K 5.5*  CL 71*  CO2 29  BUN 86*  CREATININE 7.15*  GLUCOSE 92    Recent Labs Lab 08/02/12 1255  HGB 10.1*  HCT 30.1*  WBC 16.3*  PLT 408*    Recent Labs Lab 08/02/12 1246 08/02/12 1255  WBC  --  16.3*  LATICACIDVEN 0.8  --    Dg Chest 2 View  08/02/2012  *RADIOLOGY REPORT*  Clinical Data: Altered mental status  CHEST - 2 VIEW  Comparison: 03/06/2011, 09/14/2010  Findings: Stable heart size and vascularity.  Minimal streaky basilar atelectasis.  Negative for CHF, pneumonia, collapse, consolidation, effusion or pneumothorax.  Trachea is midline. Degenerative changes of the spine.  IMPRESSION: Stable exam.  No significant acute process   Original Report Authenticated By: Judie Petit. Shick, M.D.     ASSESSMENT / PLAN  1) Acute renal failure - likely in setting of hypotension, depleted intravascular volume given poor PO intake and use of lasix and ace inhibitor  2) Hyponatremia - in setting of hypovolemia 3) Hyperkalemia- in setting of ARF 4) Hypotension/hypovolemia - in setting of poor PO intake, use of lasix and ace inhibitor . No evidence of shock 5) Nausea - in setting of poorly control gastric reflux 6)  Chronic dyspnea: sleep apnea ([place] w/ CPAP), asthma (more VCD). This needs further eval in the out patient setting. Needs PSG and formal sleep eval.  7) Diastolic dysfunction: would benefit from cards involvement, followed by Myrtis Ser  8) abd pain: chronic  Recommendations:  Admit to SDU. IVFs. Hold antihypertensives and diuretics. Renal consulted.  Needs pulm follow up in out-pt setting: [phone]. F/u cultures, send PCT, would not use abx at this point.  Renal failure due to dehydration, N/V and continuing to take diuretics and ACE.  Renal will address renal failure specially now that he is starting to make urine.  Will need CPAP while asleep and f/u with sleep MD in [facility] as outpatient.  Number above.  PCCM will be available PRN.  Alyson Reedy, M.D. Pulmonary and Critical Care Medicine [facility] Pager: [phone]  08/02/2012, 3:14 PM

## 2012-08-02 NOTE — Progress Notes (Signed)
ANTIBIOTIC CONSULT NOTE - INITIAL  Pharmacy Consult for vancomycin + levaqui Indication: rule out sepsis  Allergies  Allergen Reactions  . Aspirin Other (See Comments)    Causes asthmatic symptoms  . Statins     Joint pain   . Penicillins Rash    Patient Measurements: Height: 6' (182.9 cm) Weight: 393 lb 1.3 oz (178.3 kg) IBW/kg (Calculated) : 77.6  Vital Signs: Temp: 97.8 F (36.6 C) (03/03 1905) Temp src: Oral (03/03 1905) BP: 124/57 mmHg (03/03 1905) Pulse Rate: 78 (03/03 1905) Intake/Output from previous day:   Intake/Output from this shift:    Labs:  Recent Labs  08/02/12 1255 08/02/12 1429 08/02/12 1840  WBC 16.3*  --   --   HGB 10.1*  --   --   PLT 408*  --   --   LABCREA  --  42.97  --   CREATININE 7.15*  --  6.83*   Estimated Creatinine Clearance: 22.1 ml/min (by C-G formula based on Cr of 6.83). No results found for this basename: VANCOTROUGH, VANCOPEAK, VANCORANDOM, GENTTROUGH, GENTPEAK, GENTRANDOM, TOBRATROUGH, TOBRAPEAK, TOBRARND, AMIKACINPEAK, AMIKACINTROU, AMIKACIN,  in the last 72 hours   Microbiology: No results found for this or any previous visit (from the past 720 hour(s)).  Medical History: Past Medical History  Diagnosis Date  . Hypertension   . CHF (congestive heart failure)     Diastolic  . Arthritis   . Hyperlipemia   . Sleep apnea     He wore CPAP once  . Asthma   . Schizophrenia   . Hypothyroidism   . Bipolar 1 disorder   . Ejection fraction     EF 50-55%, March, 2012, technically difficult study, some RV dilatation  . Venous insufficiency   . Lithium toxicity     April, 2012  . Edema     Secondary to diastolic CHF  . Obesity   . Statin intolerance     Muscle aches from statins in the past    Medications:  Prescriptions prior to admission  Medication Sig Dispense Refill  . ABILIFY 5 MG tablet Take 5 mg by mouth at bedtime.       Marland Kitchen albuterol (PROVENTIL HFA;VENTOLIN HFA) 108 (90 BASE) MCG/ACT inhaler Inhale 2  puffs into the lungs every 4 (four) hours as needed for wheezing.      Marland Kitchen alprazolam (XANAX) 2 MG tablet Take 1-2 mg by mouth 4 (four) times daily as needed for anxiety.       . dicyclomine (BENTYL) 20 MG tablet Take 1 tablet by mouth 4 times daily.      Marland Kitchen esomeprazole (NEXIUM) 40 MG capsule Take 40 mg by mouth at bedtime.        . fenofibrate 160 MG tablet Take 160 mg by mouth daily.      . furosemide (LASIX) 80 MG tablet Take 80 mg by mouth 2 (two) times daily.      Marland Kitchen HYDROcodone-acetaminophen (NORCO) 10-325 MG per tablet Take 1 tablet by mouth every 6 (six) hours as needed for pain. For pain      . imipramine (TOFRANIL) 50 MG tablet Take 50 mg by mouth at bedtime.       Marland Kitchen lactulose (CHRONULAC) 10 GM/15ML solution Take 30 mLs by mouth 3 (three) times daily as needed (for constipation).       Marland Kitchen levothyroxine (SYNTHROID, LEVOTHROID) 100 MCG tablet Take 100 mcg by mouth every morning.        Marland Kitchen lisinopril (PRINIVIL,ZESTRIL) 10 MG  tablet Take 10 mg by mouth daily.       . metoCLOPramide (REGLAN) 10 MG tablet Take 10 mg by mouth 4 (four) times daily as needed (for stomach pain).       Marland Kitchen metolazone (ZAROXOLYN) 2.5 MG tablet Take 2.5 mg by mouth daily.      Marland Kitchen morphine (MS CONTIN) 60 MG 12 hr tablet Take 60 mg by mouth every 12 (twelve) hours. As needed for pain      . ondansetron (ZOFRAN) 4 MG tablet Take 4 mg by mouth every 8 (eight) hours as needed for nausea.       . polyethylene glycol powder (GLYCOLAX/MIRALAX) powder Take 17 g by mouth daily as needed (for constipation).       . potassium chloride SA (K-DUR,KLOR-CON) 20 MEQ tablet Take 20 mEq by mouth daily.      Marland Kitchen spironolactone (ALDACTONE) 50 MG tablet Take 50 mg by mouth daily.       Marland Kitchen tiZANidine (ZANAFLEX) 4 MG tablet Take 4-8 mg by mouth every 8 (eight) hours as needed (for muscle spasm).       . traZODone (DESYREL) 150 MG tablet Take 300 mg by mouth at bedtime.       Marland Kitchen venlafaxine (EFFEXOR) 75 MG tablet Take 75 mg by mouth daily.         Assessment: 46 yom presented to the hospital with abdominal distention and nausea to start empiric antibiotics for possible sepsis. Of note, he is morbidly obese and he has AKI with a Scr of 6.83.   Goal of Therapy:  Vancomycin trough level 15-20 mcg/ml  Plan:  1. Vancomycin 2500mg  IV x 1 then 2gm IV Q48H 2. Levaquin 750mg  IV x 1 then 500mg  IV Q48H 3. F/u renal fxn, C&S, clinical status and trough at [facility], Drake Leach 08/02/2012,8:36 PM

## 2012-08-02 NOTE — H&P (Addendum)
[facility] History and Physical  [name] ZOX:[id number] DOB: [birth year] DOA: 08/02/2012  Referring physician: ER physician PCP: Joycelyn Rua, MD   Chief Complaint: abdominal distention, nausea  HPI:  47 year old morbidly obese male with quite a significant past medical history including OHS/OSA, history of CHF (although based on 2 D ECHO in 04/2012 EF 60% and proBNP never significantly elevated per EPIC review) for which he is on diuretics, significant psychiatric disorders bipolar, depression and schizophrenia who presented to ED with complaints of nausea and abdominal pain and distention over past 1-2 weeks prior to this admission. In addition, BP checked at home was in low 80's. Patient denies chest pain, shortness of breath, palpitations. He did report some dizziness but no loss of consciousness. No reports of blood in stool or urine. No fever or chills or cough. No headaches, no blurry vision. In ED, his BMET and CBC was all abnormal from hyponatremia of 119, hyperkalemia of 5.5, creatinine of 7.15 and calcium 11.4. CBC revealed leukocytosis of 16.3, hemoglobin of 10.1. Further, CT abdomen/pelvis revealed abdominal distention. CXR and renal ultrasound were essentially unremarkable. PCCM and nephrology consulted by ED physician.  Assessment and Plan:  Principal Problem:   *Abdominal  Pain and nausea  Could be secondary to distended bladder but Iam concerned about potential lithium toxicity even with low Lithium level. Patient reports no current use of lithium but he is on multiple antipsychotic and bipolar medications and he does have history of high lithium levels in 2012. In addition, patient has many electrolyte imbalances in addition to leukocytosis which all can be associated with lithium toxicity.   We will for now continue IV fluids, insert foley catheter and continue antiemetics as needed.  Active Problems:   Hypotension, possible sepsis  Obvious concern is for  sepsis as patient remained hypotensive even after 1.5 L normal saline. His urinalysis is negative for UTI and CXR is unremarkable  Procalcitonin is elevated at 0.3. I think it would be reasonable to start empiric antibiotics until we get results of blood cultures   Acute kidney failur  Possible sepsis, urinary retention, lithium toxicity  Continue IV fluids  Follow up BMP in am Hypothyroidism  Continue synthroid Schizophrenia and Bipolar 1 disorder  Will continue current psych meds  We will get psych consul in am to see if all those antipsychotics are necessary as it seems it is getting to polypharmacy   Lithium toxicity  As mentioned above, suspicion of lithium toxicity even with low lithium level  Continue IV fluids  Monitor BMET   Anemia, iron deficiency  Hemoglobin 10.1 on this admission  No signs of bleed  Continue to monitor CBC   Leukocytosis, unspecified  Sepsis versus lithium toxicity   Hyponatremia, Hypercalcemia, Hyperkalemia  Possible lithium toxicity  Potassium now WNL and sodium improving to 123  Will continue to monitor  Continue IV fluids Morbid obesity  Code Status: Full Family Communication: Pt at bedside Disposition Plan: Admit for further evaluation  Manson Passey, MD  [facility] Pager [phone]  If 7PM-7AM, please contact night-coverage [email or web address] Password [email or web address] 08/02/2012, 8:10 PM   Review of Systems:  Constitutional: Negative for fever, chills and malaise/fatigue. Negative for diaphoresis.  HENT: Negative for hearing loss, ear pain, nosebleeds, congestion, sore throat, neck pain, tinnitus and ear discharge.   Eyes: Negative for blurred vision, double vision, photophobia, pain, discharge and redness.  Respiratory: Negative for cough, hemoptysis, sputum production, shortness of breath, wheezing and stridor.   Cardiovascular: Negative for  chest pain, palpitations, orthopnea, claudication and leg swelling.  Gastrointestinal: positive for  nausea, no vomiting, positive for abdominal pain and distention. No blood in stool or urine. Genitourinary: Negative for dysuria, urgency, frequency, hematuria and flank pain.  Musculoskeletal: Negative for myalgias, back pain, joint pain and falls.  Skin: Negative for itching and rash.  Neurological: Negative for dizziness and weakness. Negative for tingling, tremors, sensory change, speech change, focal weakness, loss of consciousness and headaches.  Endo/Heme/Allergies: Negative for environmental allergies and polydipsia. Does not bruise/bleed easily.  Psychiatric/Behavioral: Negative for suicidal ideas. The patient is not nervous/anxious.      Past Medical History  Diagnosis Date  . Hypertension   . CHF (congestive heart failure)     Diastolic  . Arthritis   . Hyperlipemia   . Sleep apnea     He wore CPAP once  . Asthma   . Schizophrenia   . Hypothyroidism   . Bipolar 1 disorder   . Ejection fraction     EF 50-55%, March, 2012, technically difficult study, some RV dilatation  . Venous insufficiency   . Lithium toxicity     April, 2012  . Edema     Secondary to diastolic CHF  . Obesity   . Statin intolerance     Muscle aches from statins in the past   Past Surgical History  Procedure Laterality Date  . Tonsillectomy     Social History:  reports that he has been smoking Cigarettes.  He has a 30 pack-year smoking history. He has never used smokeless tobacco. He reports that he does not drink alcohol. His drug history is not on file.  Allergies  Allergen Reactions  . Aspirin Other (See Comments)    Causes asthmatic symptoms  . Statins     Joint pain   . Penicillins Rash    Family History: htn in mother  Prior to Admission medications   Medication Sig Start Date End Date Taking? Authorizing Provider  ABILIFY 5 MG tablet Take 5 mg by mouth at bedtime.  04/12/12  Yes Historical Provider, MD  albuterol (PROVENTIL HFA;VENTOLIN HFA) 108 (90 BASE) MCG/ACT inhaler Inhale  2 puffs into the lungs every 4 (four) hours as needed for wheezing.   Yes Historical Provider, MD  alprazolam Prudy Feeler) 2 MG tablet Take 1-2 mg by mouth 4 (four) times daily as needed for anxiety.    Yes Historical Provider, MD  dicyclomine (BENTYL) 20 MG tablet Take 1 tablet by mouth 4 times daily. 09/23/11  Yes Historical Provider, MD  esomeprazole (NEXIUM) 40 MG capsule Take 40 mg by mouth at bedtime.     Yes Historical Provider, MD  fenofibrate 160 MG tablet Take 160 mg by mouth daily.   Yes Historical Provider, MD  furosemide (LASIX) 80 MG tablet Take 80 mg by mouth 2 (two) times daily.   Yes Historical Provider, MD  HYDROcodone-acetaminophen (NORCO) 10-325 MG per tablet Take 1 tablet by mouth every 6 (six) hours as needed for pain. For pain   Yes Historical Provider, MD  imipramine (TOFRANIL) 50 MG tablet Take 50 mg by mouth at bedtime.  04/11/12  Yes Historical Provider, MD  lactulose (CHRONULAC) 10 GM/15ML solution Take 30 mLs by mouth 3 (three) times daily as needed (for constipation).  09/23/11  Yes Historical Provider, MD  levothyroxine (SYNTHROID, LEVOTHROID) 100 MCG tablet Take 100 mcg by mouth every morning.     Yes Historical Provider, MD  lisinopril (PRINIVIL,ZESTRIL) 10 MG tablet Take 10 mg by mouth  daily.  02/15/12  Yes Historical Provider, MD  metoCLOPramide (REGLAN) 10 MG tablet Take 10 mg by mouth 4 (four) times daily as needed (for stomach pain).  09/27/11  Yes Historical Provider, MD  metolazone (ZAROXOLYN) 2.5 MG tablet Take 2.5 mg by mouth daily.   Yes Historical Provider, MD  morphine (MS CONTIN) 60 MG 12 hr tablet Take 60 mg by mouth every 12 (twelve) hours. As needed for pain   Yes Historical Provider, MD  ondansetron (ZOFRAN) 4 MG tablet Take 4 mg by mouth every 8 (eight) hours as needed for nausea.  02/05/12  Yes Historical Provider, MD  polyethylene glycol powder (GLYCOLAX/MIRALAX) powder Take 17 g by mouth daily as needed (for constipation).  02/05/12  Yes Historical Provider, MD   potassium chloride SA (K-DUR,KLOR-CON) 20 MEQ tablet Take 20 mEq by mouth daily.   Yes Historical Provider, MD  spironolactone (ALDACTONE) 50 MG tablet Take 50 mg by mouth daily.  02/04/12  Yes Historical Provider, MD  tiZANidine (ZANAFLEX) 4 MG tablet Take 4-8 mg by mouth every 8 (eight) hours as needed (for muscle spasm).  01/19/12  Yes Historical Provider, MD  traZODone (DESYREL) 150 MG tablet Take 300 mg by mouth at bedtime.  04/11/12  Yes Historical Provider, MD  venlafaxine (EFFEXOR) 75 MG tablet Take 75 mg by mouth daily.    Yes Historical Provider, MD   Physical Exam: Filed Vitals:   08/02/12 1500 08/02/12 1600 08/02/12 1700 08/02/12 1905  BP: 99/49 91/44 111/55 124/57  Pulse: 74 77 75 78  Temp:    97.8 F (36.6 C)  TempSrc:    Oral  Resp: 12 15 10 11   Height:    6' (1.829 m)  Weight:    178.3 kg (393 lb 1.3 oz)  SpO2: 99% 99% 96% 97%    Physical Exam  Constitutional: morbidly obese and No distress.  HENT: Normocephalic. External right and left ear normal. Oropharynx is clear and moist.  Eyes: Conjunctivae and EOM are normal. PERRLA, no scleral icterus.  Neck: Normal ROM. Neck supple. No JVD. No tracheal deviation. No thyromegaly.  CVS: RRR, S1/S2 +, no murmurs, no gallops, no carotid bruit.  Pulmonary: Effort and breath sounds normal, no stridor, rhonchi, wheezes, rales.  Abdominal: Soft. BS +,  no distension, tenderness, rebound or guarding.  Musculoskeletal: Normal range of motion. No edema and no tenderness.  Lymphadenopathy: No lymphadenopathy noted, cervical, inguinal. Neuro: Alert. Normal reflexes, muscle tone coordination. No cranial nerve deficit. Skin: Skin is warm and dry. No rash noted. Not diaphoretic. No erythema. No pallor.  Psychiatric: Normal mood and affect. Behavior, judgment, thought content normal.   Labs on Admission:  Basic Metabolic Panel:  Recent Labs Lab 08/02/12 1255 08/02/12 1840  NA 119* 123*  K 5.5* 5.0  CL 71* 77*  CO2 29 29  GLUCOSE  92 98  BUN 86* 85*  CREATININE 7.15* 6.83*  CALCIUM 11.4* 10.5   Liver Function Tests:  Recent Labs Lab 08/02/12 1255  AST 19  ALT 13  ALKPHOS 40  BILITOT 0.3  PROT 8.8*  ALBUMIN 4.9    Recent Labs Lab 08/02/12 1255  LIPASE 79*   No results found for this basename: AMMONIA,  in the last 168 hours CBC:  Recent Labs Lab 08/02/12 1255  WBC 16.3*  NEUTROABS 12.4*  HGB 10.1*  HCT 30.1*  MCV 86.2  PLT 408*   Cardiac Enzymes:  Recent Labs Lab 08/02/12 1246  TROPONINI <0.30    Radiological Exams on  Admission: Ct Abdomen Pelvis Wo Contrast 08/02/2012  * IMPRESSION: Markedly distended urinary bladder. Small umbilical hernia containing fat. Otherwise negative exam.     Dg Chest 2 View 08/02/2012  *IMPRESSION: Stable exam.  No significant acute process   Original Report Authenticated By: Judie Petit. Miles Costain, M.D.    US Renal 08/02/2012    IMPRESSION: Unremarkable sonographic appearance of the kidneys. Markedly distended urinary bladder containing calculated 2051 ml of urine.      EKG: Normal sinus rhythm, no ST/T wave changes  Time spent: 110 minutes

## 2012-08-02 NOTE — ED Notes (Signed)
Patient transported to X-ray 

## 2012-08-02 NOTE — Consult Note (Signed)
[facility] - CONSULT NOTE Resident Note    Please see below for attending addendum to resident note.   Date: 08/02/2012                  Patient Name:  [name]  MRN: [id number]  DOB: 04/24/1966  Age / Sex: 47 y.o., male         PCP: Joycelyn Rua                 Referring Physician: Dr. Manus Gunning                 Reason for Consult: Acute renal failure            History of Present Illness: Patient is a 47 y.o. male with a PMHx of HTN, OSA, CHF, hypothyroidism, Bipolar and schizophrenia, who was admitted to [facility] on 08/02/2012 for evaluation of nausea and hypotension.  Patient's wife says is been nauseated for the better part of last month and had 2 episodes of vomiting over the weekend. He continues to take his usual diuretics and reports that he has been taking Motrin 600 mg po several times daily for months  He has an appointment with GI tomorrow for a gastric emptying study. Patient states his last bowel movement was 5 days ago. Complains of abdominal distention and pain. He denies any chest pain, shortness of breath, cough or fever. His home care nurse who called EMS this morning his blood pressure was apparently in the 80s systolic.  Medications: Outpatient medications:  (Not in a hospital admission)  Current medications: Current Facility-Administered Medications  Medication Dose Route Frequency Provider Last Rate Last Dose  . 0.9 %  sodium chloride infusion   Intravenous Once Glynn Octave, MD      . 0.9 %  sodium chloride infusion   Intravenous Once Glynn Octave, MD      . sodium chloride 0.9 % bolus 500 mL  500 mL Intravenous Once Glynn Octave, MD       Current Outpatient Prescriptions  Medication Sig Dispense Refill  . ABILIFY 5 MG tablet Take 5 mg by mouth at bedtime.       Marland Kitchen albuterol (PROVENTIL HFA;VENTOLIN HFA) 108 (90 BASE) MCG/ACT inhaler Inhale 2 puffs into the lungs every 4 (four) hours as needed for wheezing.      Marland Kitchen alprazolam  (XANAX) 2 MG tablet Take 1-2 mg by mouth 4 (four) times daily as needed for anxiety.       . dicyclomine (BENTYL) 20 MG tablet Take 1 tablet by mouth 4 times daily.      Marland Kitchen esomeprazole (NEXIUM) 40 MG capsule Take 40 mg by mouth at bedtime.        . fenofibrate 160 MG tablet Take 160 mg by mouth daily.      . furosemide (LASIX) 80 MG tablet Take 80 mg by mouth 2 (two) times daily.      Marland Kitchen HYDROcodone-acetaminophen (NORCO) 10-325 MG per tablet Take 1 tablet by mouth every 6 (six) hours as needed for pain. For pain      . imipramine (TOFRANIL) 50 MG tablet Take 50 mg by mouth at bedtime.       Marland Kitchen lactulose (CHRONULAC) 10 GM/15ML solution Take 30 mLs by mouth 3 (three) times daily as needed (for constipation).       Marland Kitchen levothyroxine (SYNTHROID, LEVOTHROID) 100 MCG tablet Take 100 mcg by mouth every morning.        Marland Kitchen lisinopril (  PRINIVIL,ZESTRIL) 10 MG tablet Take 10 mg by mouth daily.       . metoCLOPramide (REGLAN) 10 MG tablet Take 10 mg by mouth 4 (four) times daily as needed (for stomach pain).       Marland Kitchen metolazone (ZAROXOLYN) 2.5 MG tablet Take 2.5 mg by mouth daily.      Marland Kitchen morphine (MS CONTIN) 60 MG 12 hr tablet Take 60 mg by mouth every 12 (twelve) hours. As needed for pain      . ondansetron (ZOFRAN) 4 MG tablet Take 4 mg by mouth every 8 (eight) hours as needed for nausea.       . polyethylene glycol powder (GLYCOLAX/MIRALAX) powder Take 17 g by mouth daily as needed (for constipation).       . potassium chloride SA (K-DUR,KLOR-CON) 20 MEQ tablet Take 20 mEq by mouth daily.      Marland Kitchen spironolactone (ALDACTONE) 50 MG tablet Take 50 mg by mouth daily.       Marland Kitchen tiZANidine (ZANAFLEX) 4 MG tablet Take 4-8 mg by mouth every 8 (eight) hours as needed (for muscle spasm).       . traZODone (DESYREL) 150 MG tablet Take 300 mg by mouth at bedtime.       Marland Kitchen venlafaxine (EFFEXOR) 75 MG tablet Take 75 mg by mouth daily.           Allergies: Allergies  Allergen Reactions  . Aspirin Other (See Comments)     Causes asthmatic symptoms  . Statins     Joint pain   . Penicillins Rash      Past Medical History: Past Medical History  Diagnosis Date  . Hypertension   . CHF (congestive heart failure)     Diastolic  . Arthritis   . Hyperlipemia   . Sleep apnea     He wore CPAP once  . Asthma   . Schizophrenia   . Hypothyroidism   . Bipolar 1 disorder   . Ejection fraction     EF 50-55%, March, 2012, technically difficult study, some RV dilatation  . Venous insufficiency   . Lithium toxicity     April, 2012  . Edema     Secondary to diastolic CHF  . Obesity   . Statin intolerance     Muscle aches from statins in the past     Past Surgical History: Past Surgical History  Procedure Laterality Date  . Tonsillectomy       Family History: No family history on file.   Social History:  reports that he has been smoking Cigarettes.  He has a 30 pack-year smoking history. He has never used smokeless tobacco. He reports that he does not drink alcohol. His drug history is not on file.   Review of Systems: As per HPI  Vital Signs: Blood pressure 84/49, pulse 86, temperature 97.9 F (36.6 C), temperature source Oral, resp. rate 19, SpO2 93.00%.  Weight trends: There were no vitals filed for this visit.  Physical Exam: General: Morbid obesity. Well-developed, well-nourished, in no acute distress; alert, appropriate and cooperative throughout examination.  Head: Normocephalic, atraumatic.  Eyes: PERRL, EOMI, No signs of anemia or jaundince.  Nose: Mucous membranes dry, not inflammed, nonerythematous.  Throat: Oropharynx nonerythematous, no exudate appreciated.   Neck: No deformities, masses, or tenderness noted.Supple, No carotid Bruits, no JVD.  Lungs:  Normal respiratory effort. Clear to auscultation BL without crackles or wheezes.  Heart: RRR. S1 and S2 normal without gallop, murmur, or rubs.  Abdomen:  BS normoactive. Soft, Nondistended, non-tender.  No masses or  organomegaly.  Extremities: No pretibial edema.  Neurologic: A&O X3, CN II - XII are grossly intact. Motor strength is 5/5 in the all 4 extremities, Sensations intact to light touch, Cerebellar signs negative.  Skin: No visible rashes, scars.    Lab results: Basic Metabolic Panel:  Recent Labs Lab 08/02/12 1255  NA 119*  K 5.5*  CL 71*  CO2 29  GLUCOSE 92  BUN 86*  CREATININE 7.15*  CALCIUM 11.4*    Liver Function Tests:  Recent Labs Lab 08/02/12 1255  AST 19  ALT 13  ALKPHOS 40  BILITOT 0.3  PROT 8.8*  ALBUMIN 4.9    Recent Labs Lab 08/02/12 1255  LIPASE 79*   No results found for this basename: AMMONIA,  in the last 168 hours  CBC:  Recent Labs Lab 08/02/12 1255  WBC 16.3*  NEUTROABS 12.4*  HGB 10.1*  HCT 30.1*  MCV 86.2  PLT 408*    Cardiac Enzymes:  Recent Labs Lab 08/02/12 1246  TROPONINI <0.30    BNP: No components found with this basename: POCBNP,   CBG: No results found for this basename: GLUCAP,  in the last 168 hours  Microbiology: Results for orders placed during the hospital encounter of 07/23/10  URINE CULTURE     Status: None   Collection Time    07/24/10  6:53 PM      Result Value Range Status   Specimen Description URINE, CLEAN CATCH   Final   Special Requests IMMUNE:NORM UT SYMPT:NEG   Final   Culture  Setup Time [id number]   Final   Colony Count NO GROWTH   Final   Culture NO GROWTH   Final   Report Status 07/25/2010 FINAL   Final    Coagulation Studies:  Recent Labs  08/02/12 1255  LABPROT 13.1  INR 1.00    Urinalysis:  Recent Labs  08/02/12 1424  COLORURINE YELLOW  LABSPEC 1.012  PHURINE 6.0  GLUCOSEU 100*  HGBUR MODERATE*  BILIRUBINUR NEGATIVE  KETONESUR NEGATIVE  PROTEINUR 30*  UROBILINOGEN 0.2  NITRITE NEGATIVE  LEUKOCYTESUR NEGATIVE      Imaging: Dg Chest 2 View  08/02/2012  *RADIOLOGY REPORT*  Clinical Data: Altered mental status  CHEST - 2 VIEW  Comparison: 03/06/2011,  09/14/2010  Findings: Stable heart size and vascularity.  Minimal streaky basilar atelectasis.  Negative for CHF, pneumonia, collapse, consolidation, effusion or pneumothorax.  Trachea is midline. Degenerative changes of the spine.  IMPRESSION: Stable exam.  No significant acute process   Original Report Authenticated By: Judie Petit. Miles Costain, M.D.       Assessment & Plan:   Patient is a 47 y.o. male with a PMHx of HTN, OSA, CHF, hypothyroidism, Bipolar and schizophrenia, who was admitted to [facility] on 08/02/2012 for evaluation of nausea and hypotension.  Renal is consulted for acute renal failure.   1. Acute renal failure Last Cr was 1.42 in Sep 2013. No hx of CKD noted. Etiology of ARF is most likely associated with volume depletion 2/2 decreased oral intake and medication induced including diuretics, ACEI and NSAIDs.   - hold home meds including lasix, lisinopril, Metolazone, Spirolactone, K-dur,  - IVF replacement ( ED ordered NS 2 L bolus)   Will continue NS at 100 ml/hour - will monitor BMP daily - monitor electrolyte and acid-base imbalance - monitor cardiac status--caution s/s HF --Hx of diastolic HF.   2. Hyperkalemia - likely associated with ARF and his  home med of spirolactone and KCL  3. Hyponatremia Likely acute dropping related to his hypovolemic and diuretic   - ED ordered 2 L bolus  - will continue NS 100 ml/hour - follow up BMP this evening and daily  Patient history and plan of care reviewed with attending, Dr. Briant Cedar.   Dede Query, MD  PGYII, Internal Medicine Resident 08/02/2012, 3:38 PM I have seen and examined this patient and agree with plan per Dr Ian Bushman.  46yo wm on multiple meds creating a perfect storm of ARF and hyponatremia.  ARF secondary to vol depletion, on ACE, on NSAID's +/- hypotension.  Hyponatremia due to any number of meds that he is on ie zaroxalyn, imipramine, effexor, trazadone.  Plan is hydration, holding culprit meds and serious re examination of his  polypharmacy.  Daily Scr.  Check renal US. MATTINGLY,MICHAEL T,MD 08/02/2012 4:52 PM

## 2012-08-02 NOTE — ED Notes (Signed)
Pt has returned from radiology. BH returned

## 2012-08-02 NOTE — ED Notes (Signed)
cna stated dr advised to insert foley if pt not void on own.  Currently pt voiding on own.

## 2012-08-02 NOTE — ED Notes (Signed)
Family at bedside.wife

## 2012-08-02 NOTE — ED Provider Notes (Signed)
History     CSN: [id number]  Arrival date & time 08/02/12  1154   First MD Initiated Contact with Patient 08/02/12 1222      Chief Complaint  Patient presents with  . Nausea    (Consider location/radiation/quality/duration/timing/severity/associated sxs/prior treatment) HPI Comments: Patient arrives via EMS with complaint of nausea and constipation. Patient's wife says is been nauseated for the better part of last month and had 2 episodes of vomiting over the weekend. He has an appointment with GI tomorrow for a gastric emptying study. Patient states his last bowel movement was 5 days ago. Complains of abdominal distention and pain. He denies any chest pain, shortness of breath, cough or fever. Patient reports history of hypertension, CHF, hyperlipidemia, obesity. His home care nurse who called EMS this morning his blood pressure was apparently in the 80s systolic.  The history is provided by the patient, the spouse and the EMS personnel. The history is limited by the condition of the patient.    Past Medical History  Diagnosis Date  . Hypertension   . CHF (congestive heart failure)     Diastolic  . Arthritis   . Hyperlipemia   . Sleep apnea     He wore CPAP once  . Asthma   . Schizophrenia   . Hypothyroidism   . Bipolar 1 disorder   . Ejection fraction     EF 50-55%, March, 2012, technically difficult study, some RV dilatation  . Venous insufficiency   . Lithium toxicity     April, 2012  . Edema     Secondary to diastolic CHF  . Obesity   . Statin intolerance     Muscle aches from statins in the past    Past Surgical History  Procedure Laterality Date  . Tonsillectomy      No family history on file.  History  Substance Use Topics  . Smoking status: Current Every Day Smoker -- 1.50 packs/day for 20 years    Types: Cigarettes  . Smokeless tobacco: Never Used  . Alcohol Use: No      Review of Systems  Constitutional: Positive for activity change, appetite  change and fatigue. Negative for fever.  HENT: Negative for congestion and rhinorrhea.   Respiratory: Negative for cough, chest tightness and shortness of breath.   Cardiovascular: Positive for leg swelling. Negative for chest pain.  Gastrointestinal: Positive for vomiting, constipation and abdominal distention. Negative for nausea and diarrhea.  Genitourinary: Negative for dysuria and hematuria.  Musculoskeletal: Positive for back pain. Negative for myalgias and arthralgias.  Skin: Negative for wound.  Neurological: Positive for dizziness, weakness and light-headedness. Negative for headaches.  A complete 10 system review of systems was obtained and all systems are negative except as noted in the HPI and PMH.    Allergies  Aspirin; Statins; and Penicillins  Home Medications   No current outpatient prescriptions on file.  BP 124/57  Pulse 78  Temp(Src) 97.8 F (36.6 C) (Oral)  Resp 11  Ht 6' (1.829 m)  Wt 393 lb 1.3 oz (178.3 kg)  BMI 53.3 kg/m2  SpO2 97%  Physical Exam  Constitutional: He appears well-developed and well-nourished. No distress.  Somnolent but arousable  HENT:  Head: Normocephalic and atraumatic.  Mouth/Throat: Oropharynx is clear and moist. No oropharyngeal exudate.  Eyes: Conjunctivae and EOM are normal. Pupils are equal, round, and reactive to light.  Neck: Normal range of motion. Neck supple.  Unable to assess for JVD secondary to neck girth  Cardiovascular:  Normal rate, regular rhythm and normal heart sounds.   No murmur heard. Pulmonary/Chest: Effort normal and breath sounds normal. No respiratory distress. He exhibits no tenderness.  Abdominal: Soft. He exhibits distension. There is tenderness. There is no rebound and no guarding.  Musculoskeletal: Normal range of motion. He exhibits edema. He exhibits no tenderness.  Pitting edema to mid tibia bilaterally  Neurological: He is alert. No cranial nerve deficit. He exhibits normal muscle tone.  Coordination normal.  Skin: Skin is warm.    ED Course  Procedures (including critical care time)  Labs Reviewed  CBC WITH DIFFERENTIAL - Abnormal; Notable for the following:    WBC 16.3 (*)    RBC 3.49 (*)    Hemoglobin 10.1 (*)    HCT 30.1 (*)    Platelets 408 (*)    Neutro Abs 12.4 (*)    Monocytes Absolute 1.1 (*)    Basophils Absolute 0.2 (*)    All other components within normal limits  COMPREHENSIVE METABOLIC PANEL - Abnormal; Notable for the following:    Sodium 119 (*)    Potassium 5.5 (*)    Chloride 71 (*)    BUN 86 (*)    Creatinine, Ser 7.15 (*)    Calcium 11.4 (*)    Total Protein 8.8 (*)    GFR calc non Af Amer 8 (*)    GFR calc Af Amer 9 (*)    All other components within normal limits  LIPASE, BLOOD - Abnormal; Notable for the following:    Lipase 79 (*)    All other components within normal limits  LITHIUM LEVEL - Abnormal; Notable for the following:    Lithium Lvl <0.25 (*)    All other components within normal limits  PRO B NATRIURETIC PEPTIDE - Abnormal; Notable for the following:    Pro B Natriuretic peptide (BNP) 238.3 (*)    All other components within normal limits  URINALYSIS, MICROSCOPIC ONLY - Abnormal; Notable for the following:    Glucose, UA 100 (*)    Hgb urine dipstick MODERATE (*)    Protein, ur 30 (*)    Squamous Epithelial / LPF MANY (*)    All other components within normal limits  URINE RAPID DRUG SCREEN (HOSP PERFORMED) - Abnormal; Notable for the following:    Opiates POSITIVE (*)    Benzodiazepines POSITIVE (*)    All other components within normal limits  BASIC METABOLIC PANEL - Abnormal; Notable for the following:    Sodium 123 (*)    Chloride 77 (*)    BUN 85 (*)    Creatinine, Ser 6.83 (*)    GFR calc non Af Amer 9 (*)    GFR calc Af Amer 10 (*)    All other components within normal limits  CULTURE, BLOOD (ROUTINE X 2)  CULTURE, BLOOD (ROUTINE X 2)  MRSA PCR SCREENING  PROTIME-INR  TROPONIN I  LACTIC ACID,  PLASMA  PROCALCITONIN  SODIUM, URINE, RANDOM  CREATININE, URINE, RANDOM  ETHANOL  URINALYSIS, ROUTINE W REFLEX MICROSCOPIC  UREA NITROGEN, URINE  BASIC METABOLIC PANEL   Ct Abdomen Pelvis Wo Contrast  08/02/2012  *RADIOLOGY REPORT*  Clinical Data: Nausea for 5 days, no bowel movement for 5 days, single episode of vomiting, history hypertension, CHF, asthma, schizophrenia, bipolar disorder  CT ABDOMEN AND PELVIS WITHOUT CONTRAST  Technique:  Multidetector CT imaging of the abdomen and pelvis was performed following the standard protocol without intravenous contrast. Sagittal and coronal MPR images reconstructed from axial  data set.  Patient drank dilute oral contrast for exam but IV contrast was not administered due to renal dysfunction.  Comparison: 02/18/2012  Findings: Lung bases clear. Liver, spleen, pancreas, kidneys, and adrenal glands unremarkable in appearance for noncontrast technique. Unremarkable gallbladder. Small metallic foreign body at cecum, uncertain etiology. Normal appendix.  Significantly distended urinary bladder, measuring 19.0 x 12.0 x 13.1 cm. Small umbilical hernia containing fat. Portion of left anterolateral abdominal wall excluded due to body habitus. Stomach and bowel loops otherwise normal appearance. No mass, adenopathy, free fluid, or inflammatory process. No gross free air or acute osseous findings.  IMPRESSION: Markedly distended urinary bladder. Small umbilical hernia containing fat. Otherwise negative exam.   Original Report Authenticated By: Ulyses Southward, M.D.    Dg Chest 2 View  08/02/2012  *RADIOLOGY REPORT*  Clinical Data: Altered mental status  CHEST - 2 VIEW  Comparison: 03/06/2011, 09/14/2010  Findings: Stable heart size and vascularity.  Minimal streaky basilar atelectasis.  Negative for CHF, pneumonia, collapse, consolidation, effusion or pneumothorax.  Trachea is midline. Degenerative changes of the spine.  IMPRESSION: Stable exam.  No significant acute process    Original Report Authenticated By: Judie Petit. Miles Costain, M.D.    US Renal  08/02/2012  *RADIOLOGY REPORT*  Clinical Data: Acute renal failure, history hypertension, CHF; hematuria, leukocytosis, creatinine 7.15  RENAL/URINARY TRACT ULTRASOUND COMPLETE  Comparison:  Abdominal ultrasound 08/06/2011  Findings:  Right Kidney:  11.8 cm length.  Normal cortical thickness and echogenicity.  No mass, hydronephrosis shadowing calcification.  Left Kidney:  14.1 cm length.  Suboptimally visualized due to body habitus and bowel.  Grossly normal cortical thickness echogenicity. No gross mass or hydronephrosis.  Bladder:  Distended urinary bladder.  Left ureteral jet visualized. Right ureteral jet was not seen during the exam. No gross mass or wall thickening.  Calculated prevoid bladder volume 2051 ml; patient unable to avoid.  IMPRESSION: Unremarkable sonographic appearance of the kidneys. Markedly distended urinary bladder containing calculated 2051 ml of urine.   Original Report Authenticated By: Ulyses Southward, M.D.      1. Hypotension   2. Altered mental status   3. Asthma   4. Dehydration   5. Obesity   6. Acute renal failure   7. Hyponatremia   8. Hyperkalemia       MDM  1 Month of nausea with intermittent vomiting, abdominal distention and crampy pain.  Patient found to have multiple lab abnormalities including hyponatremia, acute renal failure, hyperkalemia. Her pressure in the 90s systolic. Dr. Elisabeth Pigeon triad hospitalist is hesitant to admit given hypotension and is concerned the patient is septic. Discussed with critical care he was evaluated the patient and determined acceptable for triad admission.  Dr. Elisabeth Pigeon and try hospitalist will admit. Nephrology has been consulted. Patient appears to have volume depletion from diuretic use and antihypertensive use. Will require volume resuscitation and correction of electrolyte abnormalities. Place foley, measure urine output, IVF.  , Date: 08/02/2012  Rate: 73   Rhythm: normal sinus rhythm  QRS Axis: normal  Intervals: normal  ST/T Wave abnormalities: normal  Conduction Disutrbances:none  Narrative Interpretation:   Old EKG Reviewed: unchanged  CRITICAL CARE Performed by: Glynn Octave   Total critical care time: 30  Critical care time was exclusive of separately billable procedures and treating other patients.  Critical care was necessary to treat or prevent imminent or life-threatening deterioration.  Critical care was time spent personally by me on the following activities: development of treatment plan with patient and/or surrogate as  well as nursing, discussions with consultants, evaluation of patient's response to treatment, examination of patient, obtaining history from patient or surrogate, ordering and performing treatments and interventions, ordering and review of laboratory studies, ordering and review of radiographic studies, pulse oximetry and re-evaluation of patient's condition.        Glynn Octave, MD 08/02/12 [id number]

## 2012-08-03 ENCOUNTER — Ambulatory Visit (HOSPITAL_COMMUNITY): Payer: Medicare Other

## 2012-08-03 DIAGNOSIS — N179 Acute kidney failure, unspecified: Secondary | ICD-10-CM

## 2012-08-03 DIAGNOSIS — I517 Cardiomegaly: Secondary | ICD-10-CM | POA: Diagnosis present

## 2012-08-03 DIAGNOSIS — E860 Dehydration: Secondary | ICD-10-CM

## 2012-08-03 DIAGNOSIS — N170 Acute kidney failure with tubular necrosis: Principal | ICD-10-CM

## 2012-08-03 LAB — COMPREHENSIVE METABOLIC PANEL
Alkaline Phosphatase: 41 U/L (ref 39–117)
BUN: 85 mg/dL — ABNORMAL HIGH (ref 6–23)
CO2: 27 mEq/L (ref 19–32)
Chloride: 80 mEq/L — ABNORMAL LOW (ref 96–112)
Creatinine, Ser: 6.2 mg/dL — ABNORMAL HIGH (ref 0.50–1.35)
GFR calc Af Amer: 11 mL/min — ABNORMAL LOW (ref >90)
GFR calc non Af Amer: 10 mL/min — ABNORMAL LOW (ref >90)
Glucose, Bld: 99 mg/dL (ref 70–99)
Total Bilirubin: 0.3 mg/dL (ref 0.3–1.2)

## 2012-08-03 LAB — GLUCOSE, CAPILLARY: Glucose-Capillary: 99 mg/dL (ref 70–99)

## 2012-08-03 LAB — UREA NITROGEN, URINE: Urea Nitrogen, Ur: 261 mg/dL

## 2012-08-03 LAB — CBC
Platelets: 481 10*3/uL — ABNORMAL HIGH (ref 150–400)
RBC: 3.2 MIL/uL — ABNORMAL LOW (ref 4.22–5.81)
RDW: 15.4 % (ref 11.5–15.5)
WBC: 11.7 10*3/uL — ABNORMAL HIGH (ref 4.0–10.5)

## 2012-08-03 MED ORDER — HEPARIN SODIUM (PORCINE) 5000 UNIT/ML IJ SOLN
5000.0000 [IU] | Freq: Three times a day (TID) | INTRAMUSCULAR | Status: DC
  Administered 2012-08-03 – 2012-08-06 (×9): 5000 [IU] via SUBCUTANEOUS
  Filled 2012-08-03 (×12): qty 1

## 2012-08-03 MED ORDER — LACTULOSE 10 GM/15ML PO SOLN
20.0000 g | Freq: Three times a day (TID) | ORAL | Status: DC
  Administered 2012-08-03 – 2012-08-06 (×9): 20 g via ORAL
  Filled 2012-08-03 (×12): qty 30

## 2012-08-03 MED ORDER — DOCUSATE SODIUM 100 MG PO CAPS
100.0000 mg | ORAL_CAPSULE | Freq: Two times a day (BID) | ORAL | Status: DC
  Administered 2012-08-03 – 2012-08-06 (×7): 100 mg via ORAL
  Filled 2012-08-03 (×9): qty 1

## 2012-08-03 MED ORDER — POLYETHYLENE GLYCOL 3350 17 G PO PACK
17.0000 g | PACK | Freq: Every day | ORAL | Status: DC
  Administered 2012-08-03 – 2012-08-04 (×2): 17 g via ORAL
  Filled 2012-08-03 (×2): qty 1

## 2012-08-03 MED ORDER — NICOTINE 21 MG/24HR TD PT24
21.0000 mg | MEDICATED_PATCH | Freq: Every day | TRANSDERMAL | Status: DC
  Administered 2012-08-03 – 2012-08-06 (×4): 21 mg via TRANSDERMAL
  Filled 2012-08-03 (×4): qty 1

## 2012-08-03 NOTE — Progress Notes (Signed)
In at bedside to visit with patient and to explain the hospital liaison role.  Patient expressed appreciation for the visit.  Contact information provided.  Of note, [facility] Care Management services does not replace or interfere with any services that are arranged by inpatient case management or social work.  For additional questions or referrals please contact Anibal Henderson BSN RN [facility] Liaison at [phone].

## 2012-08-03 NOTE — Progress Notes (Signed)
[facility] - PROGRESS NOTE Resident Note   Please see below for attending addendum to resident note.  Subjective:    Feels better Slightly nausea. No vomiting. No SOB or chest pain. No fatigue, No acute events overnight.   Objective:    Vital Signs:   Temp:  [97.8 F (36.6 C)-98.4 F (36.9 C)] 98.4 F (36.9 C) (03/04 0800) Pulse Rate:  [70-86] 79 (03/04 0800) Resp:  [9-19] 13 (03/04 0800) BP: (80-136)/(26-101) 99/42 mmHg (03/04 0800) SpO2:  [93 %-100 %] 98 % (03/04 0800) Weight:  [378 lb 15.5 oz (171.9 kg)-393 lb 1.3 oz (178.3 kg)] 378 lb 15.5 oz (171.9 kg) (03/04 0500) Last BM Date: 07/28/12  24-hour weight change: Weight change:   Weight trends: Filed Weights   08/02/12 1905 08/03/12 0500  Weight: 393 lb 1.3 oz (178.3 kg) 378 lb 15.5 oz (171.9 kg)    Intake/Output:  03/03 0701 - 03/04 0700 In: 1250 [I.V.:600; IV Piggyback:650] Out: 2500 [Urine:2500] Physical Exam:  General:  Morbid obesity. Well-developed, well-nourished, in no acute distress; alert, appropriate and cooperative throughout examination.   Head:  Normocephalic, atraumatic.   Eyes:  PERRL, EOMI, No signs of anemia or jaundince.   Nose:  Mucous membranes moist, not inflammed, nonerythematous.   Throat:  Oropharynx nonerythematous, no exudate appreciated.   Neck:  No deformities, masses, or tenderness noted.Supple, No carotid Bruits, no JVD.   Lungs:  Normal respiratory effort. Clear to auscultation BL without crackles or wheezes.   Heart:  RRR. S1 and S2 normal without gallop, murmur, or rubs.   Abdomen:  BS normoactive. Soft, Nondistended, non-tender. No masses or organomegaly.   Extremities:  No pretibial edema.   Neurologic:  A&O X3, CN II - XII are grossly intact. Motor strength is 5/5 in the all 4 extremities, Sensations intact to light touch, Cerebellar signs negative.   Skin:  No visible rashes, scars.     Labs: Basic Metabolic Panel:  Recent Labs Lab 08/02/12 1255  08/02/12 1840 08/02/12 2118 08/03/12 0628  NA 119* 123* 124* 124*  K 5.5* 5.0 4.7 4.6  CL 71* 77* 78* 80*  CO2 29 29 28 27   GLUCOSE 92 98 96 99  BUN 86* 85* 85* 85*  CREATININE 7.15* 6.83* 6.71* 6.20*  CALCIUM 11.4* 10.5 10.4 10.0  MG  --   --  2.4  --   PHOS  --   --  9.3*  --     Liver Function Tests:  Recent Labs Lab 08/02/12 1255 08/02/12 2118 08/03/12 0628  AST 19 18 17   ALT 13 12 11   ALKPHOS 40 37* 41  BILITOT 0.3 0.3 0.3  PROT 8.8* 8.2 7.7  ALBUMIN 4.9 4.4 4.1    Recent Labs Lab 08/02/12 1255  LIPASE 79*   No results found for this basename: AMMONIA,  in the last 168 hours  CBC:  Recent Labs Lab 08/02/12 1255 08/02/12 2118 08/03/12 0628  WBC 16.3* 15.3* 11.7*  NEUTROABS 12.4* 10.8*  --   HGB 10.1* 9.0* 9.2*  HCT 30.1* 27.0* 28.0*  MCV 86.2 86.3 87.5  PLT 408* 472* 481*    Cardiac Enzymes:  Recent Labs Lab 08/02/12 1246  TROPONINI <0.30    BNP: No components found with this basename: POCBNP,   CBG:  Recent Labs Lab 08/03/12 0814  GLUCAP 99    Microbiology: Results for orders placed during the hospital encounter of 08/02/12  CULTURE, BLOOD (ROUTINE X 2)     Status: None  Collection Time    08/02/12  3:10 PM      Result Value Range Status   Specimen Description BLOOD ARM LEFT   Final   Special Requests     Final   Value: BOTTLES DRAWN AEROBIC AND ANAEROBIC 10CC BLUE 5CC RED   Culture  Setup Time 08/02/2012 21:24   Final   Culture     Final   Value:        BLOOD CULTURE RECEIVED NO GROWTH TO DATE CULTURE WILL BE HELD FOR 5 DAYS BEFORE ISSUING A FINAL NEGATIVE REPORT   Report Status PENDING   Incomplete  CULTURE, BLOOD (ROUTINE X 2)     Status: None   Collection Time    08/02/12  3:25 PM      Result Value Range Status   Specimen Description BLOOD ARM RIGHT   Final   Special Requests BOTTLES DRAWN AEROBIC ONLY 10CC   Final   Culture  Setup Time 08/02/2012 21:24   Final   Culture     Final   Value:        BLOOD CULTURE  RECEIVED NO GROWTH TO DATE CULTURE WILL BE HELD FOR 5 DAYS BEFORE ISSUING A FINAL NEGATIVE REPORT   Report Status PENDING   Incomplete  MRSA PCR SCREENING     Status: None   Collection Time    08/02/12  7:15 PM      Result Value Range Status   MRSA by PCR NEGATIVE  NEGATIVE Final   Comment:            The GeneXpert MRSA Assay (FDA     approved for NASAL specimens     only), is one component of a     comprehensive MRSA colonization     surveillance program. It is not     intended to diagnose MRSA     infection nor to guide or     monitor treatment for     MRSA infections.    Coagulation Studies:  Recent Labs  08/02/12 1255 08/02/12 2118  LABPROT 13.1 13.7  INR 1.00 1.06    Urinalysis:  Recent Labs  08/02/12 1424 08/02/12 2216  COLORURINE YELLOW YELLOW  LABSPEC 1.012 1.011  PHURINE 6.0 5.5  GLUCOSEU 100* 100*  HGBUR MODERATE* TRACE*  BILIRUBINUR NEGATIVE NEGATIVE  KETONESUR NEGATIVE NEGATIVE  PROTEINUR 30* NEGATIVE  UROBILINOGEN 0.2 0.2  NITRITE NEGATIVE NEGATIVE  LEUKOCYTESUR NEGATIVE NEGATIVE      Imaging: Ct Abdomen Pelvis Wo Contrast  08/02/2012  *RADIOLOGY REPORT*  Clinical Data: Nausea for 5 days, no bowel movement for 5 days, single episode of vomiting, history hypertension, CHF, asthma, schizophrenia, bipolar disorder  CT ABDOMEN AND PELVIS WITHOUT CONTRAST  Technique:  Multidetector CT imaging of the abdomen and pelvis was performed following the standard protocol without intravenous contrast. Sagittal and coronal MPR images reconstructed from axial data set.  Patient drank dilute oral contrast for exam but IV contrast was not administered due to renal dysfunction.  Comparison: 02/18/2012  Findings: Lung bases clear. Liver, spleen, pancreas, kidneys, and adrenal glands unremarkable in appearance for noncontrast technique. Unremarkable gallbladder. Small metallic foreign body at cecum, uncertain etiology. Normal appendix.  Significantly distended urinary  bladder, measuring 19.0 x 12.0 x 13.1 cm. Small umbilical hernia containing fat. Portion of left anterolateral abdominal wall excluded due to body habitus. Stomach and bowel loops otherwise normal appearance. No mass, adenopathy, free fluid, or inflammatory process. No gross free air or acute osseous findings.  IMPRESSION: Markedly  distended urinary bladder. Small umbilical hernia containing fat. Otherwise negative exam.   Original Report Authenticated By: Ulyses Southward, M.D.    Dg Chest 2 View  08/02/2012  *RADIOLOGY REPORT*  Clinical Data: Altered mental status  CHEST - 2 VIEW  Comparison: 03/06/2011, 09/14/2010  Findings: Stable heart size and vascularity.  Minimal streaky basilar atelectasis.  Negative for CHF, pneumonia, collapse, consolidation, effusion or pneumothorax.  Trachea is midline. Degenerative changes of the spine.  IMPRESSION: Stable exam.  No significant acute process   Original Report Authenticated By: Judie Petit. Miles Costain, M.D.    US Renal  08/02/2012  *RADIOLOGY REPORT*  Clinical Data: Acute renal failure, history hypertension, CHF; hematuria, leukocytosis, creatinine 7.15  RENAL/URINARY TRACT ULTRASOUND COMPLETE  Comparison:  Abdominal ultrasound 08/06/2011  Findings:  Right Kidney:  11.8 cm length.  Normal cortical thickness and echogenicity.  No mass, hydronephrosis shadowing calcification.  Left Kidney:  14.1 cm length.  Suboptimally visualized due to body habitus and bowel.  Grossly normal cortical thickness echogenicity. No gross mass or hydronephrosis.  Bladder:  Distended urinary bladder.  Left ureteral jet visualized. Right ureteral jet was not seen during the exam. No gross mass or wall thickening.  Calculated prevoid bladder volume 2051 ml; patient unable to avoid.  IMPRESSION: Unremarkable sonographic appearance of the kidneys. Markedly distended urinary bladder containing calculated 2051 ml of urine.   Original Report Authenticated By: Ulyses Southward, M.D.       Medications:    Infusions: .  sodium chloride 75 mL/hr at 08/03/12 0825  . sodium chloride      Scheduled Medications: . sodium chloride   Intravenous Once  . ARIPiprazole  5 mg Oral QHS  . dicyclomine  20 mg Oral TID AC & HS  . fenofibrate  160 mg Oral Daily  . imipramine  50 mg Oral QHS  . [START ON 08/04/2012] levofloxacin (LEVAQUIN) IV  500 mg Intravenous Q48H  . levothyroxine  100 mcg Oral BH-q7a  . morphine  60 mg Oral Q12H  . pantoprazole  40 mg Oral QAC breakfast  . sodium chloride  3 mL Intravenous Q12H  . traZODone  300 mg Oral QHS  . [START ON 08/04/2012] vancomycin  2,000 mg Intravenous Q48H  . venlafaxine  75 mg Oral Daily    PRN Medications: acetaminophen, acetaminophen, albuterol, alprazolam, HYDROcodone-acetaminophen, lactulose, metoCLOPramide, morphine injection, ondansetron (ZOFRAN) IV, ondansetron, tiZANidine   Assessment/ Plan:    Patient is a 47 y.o. male with a PMHx of HTN, OSA, CHF, hypothyroidism, Bipolar and schizophrenia, who was admitted to [facility] on 08/02/2012 for evaluation of nausea and hypotension. Patient reports N/V over one month and he continued to take his usual home diuretics. Reports that he has been taking Motrin 600 mg po several times daily for months.   Renal is consulted for acute renal failure.   1. Acute renal failure, better  Last Cr was 1.42 in Sep 2013. No hx of CKD noted.  Etiology of ARF is most likely associated with volume depletion 2/2 decreased oral intake and medication induced including diuretics, ACEI and NSAIDs.   Reports that he has been off Lithium over a year.  - hold home meds including lasix, lisinopril, Metolazone, Spirolactone, K-dur,  - IVF replacement ( ED ordered NS 2 L bolus)  Will continue NS at 100 ml/hour  - will monitor BMP daily  - monitor electrolyte and acid-base imbalance  - monitor cardiac status--caution s/s HF --Hx of diastolic HF.  - No indication for HD( No hyperkalemia,  No uremia even though BUN 85, no overt fluid overload, no  acid base imbalance)  2. Hyperkalemia, corrected - likely associated with ARF and his home med of spirolactone and KCL   3. Hyponatremia  Likely acute dropping related to his hypovolemia, diuretic Metolazone, imipramine, effexor, trazadone   - will defer to primary team.  - ED ordered 2 L bolus  - will continue NS 100 ml/hour  - follow up BMP daily  4. VTE: He is high risk with morbid obesity and decreased mobility.    Recs Heparin --defer to primary care team  Length of Stay: 1 days  Patient history and plan of care reviewed with attending, Dr. Briant Cedar.   Dede Query, MD  PGYII, Internal Medicine Resident 08/03/2012, 9:23 AM I have seen and examined this patient and agree with plan per Dr Ian Bushman.  SNa better, Scr start to trend down.  Scr 1.15 in 12/13.  Cont IV fluids and follow Scr daily. Could advance diet. MATTINGLY,MICHAEL T,MD 08/03/2012 11:44 AM

## 2012-08-03 NOTE — Progress Notes (Signed)
Patient is currently active with [facility] Care Management for chronic disease management services.  Patient has been engaged by a [employer] and LCSW.  Our community based plan of care has focused on management of his CHF, Depression/Anxiety, and chronic pain.  Patient was sent to the ED on 3.3.14 by his [facility] NP because he was found at home hypotensive and somnolent.  He indicated that he had taken his morphine for pain and possibly some other medicines that he could not recall.  Patient will receive a post discharge transition of care call and will be evaluated for monthly home visits for assessments and disease process education.  Made inpatient Case Manager Herietta Mayo RN aware that [facility] Management following. Of note, [facility] Care Management services does not replace or interfere with any services that are arranged by inpatient case management or social work.  For additional questions or referrals please contact Anibal Henderson BSN RN [facility] Liaison at [phone].

## 2012-08-03 NOTE — Progress Notes (Signed)
Utilization Review Completed. 08/03/2012  

## 2012-08-03 NOTE — Progress Notes (Signed)
[facility] Progress Note [place] TEAM 1 - Stepdown/ICU TEAM   [place] WENZLICK MVH:[id number] DOB: [birth year] DOA: 08/02/2012 PCP: Joycelyn Rua, MD  Brief narrative: 47 year old male patient with multiple medical problems but with apparent underlying normal renal function. Presented to the emergency department with complaints of nausea abdominal pain with increasing abdominal distention over the past one to 2 weeks. Patient normally hypertensive but blood pressure checked at home prior to arrival demonstrated systolic blood pressure in the 80s. The only other symptom the patient was having with dizziness. In the ER patient was found to be hyponatremic with a sodium of 119 and hyperkalemic with potassium of 5.5 and a marked elevation in creatinine up to 7.15 baseline creatinine around 1.42. Calcium was also elevated at 11.4. Was also found to have leukocytosis 16,300. Because of abdominal pain a CT of the abdomen and pelvis was completed that revealed urinary distention. Foley catheter was subsequently placed with greater than 2500 cc return of urine. Chest x-ray and renal ultrasound were also unremarkable.  Assessment/Plan: Active Problems:   Acute kidney failure due to : A) ATN (acute tubular necrosis) B) Dehydration with hyponatremia/ Hyperkalemia C) Hypotension -no prior h/o CKD note baseline Scr 1.42 -appreciate nephro assistance -continue IVF hydration-potassium normalized and phos has decr. from 9 to 6 -hold offending meds: Lasix, Aldactone, ACE I, and NSAID's -acidosis currently compensated -hyponatremia potentially influenced by psych meds but has increased significantly after hydration so suspect dehydration/ARF primary cause -try to keep SBP >95 to minimize further renal injury -has high UOP    Urinary retention with incomplete bladder emptying -? cause- likely 2/2 constipation and narcs -need to leave foley for accurate I/O while SCr > 3 -no apparent BPH and no prior  h/o urinary retention sx's      Schizophrenia/Bipolar 1 disorder -cont. Home meds    Anemia, iron deficiency -baseline hgb ~ 12-watch closely given may have acute decrease due to ARF    Leukocytosis, unspecified  -likely due to recent acute urinary retention -FU on urine and blood cx's    Mild LVH (left ventricular hypertrophy) (ECHO 2013) -likely has underlying diastolic dysfunction -use caution with hydration    Chronic pain syndrome/Chronic constipation IBS -cont MS Contin but watch for oversedation in setting of ARF -change home prn Lactulose to scheduled and add colace and Miralax -Has metallic foreign body seen on CT scan at cecum level-suspect may have ingested filling from tooth-no evidence of peritonitis or bowel perforation but continue to monitor    Hyperlipemia    ? Sleep apnea/Obesity -has not had any formal testing- rec. PSG thru PCP ofc after dc    Asthma    Hypothyroidism -TSH normal- cont Synthroid    DVT prophylaxis: Change to subcutaneous heparin Code Status: Full Family Communication: Patient and wife Disposition Plan: Step  Consultants: Nephrology  Procedures: None  Antibiotics: Levaquin 3/4 >>> Vancomycin 3/4 >>>  HPI/Subjective: Patient alert-no complaints. His wife and sister are at bedside. Plan of care has been discussed in detail and questions answered.  Objective: Blood pressure 126/55, pulse 76, temperature 98.6 F (37 C), temperature source Oral, resp. rate 11, height 6' (1.829 m), weight 171.9 kg (378 lb 15.5 oz), SpO2 100.00%.  Intake/Output Summary (Last 24 hours) at 08/03/12 1232 Last data filed at 08/03/12 1200  Gross per 24 hour  Intake 2072.5 ml  Output   5800 ml  Net -3727.5 ml     Exam: General: No acute respiratory distress Lungs: Clear to auscultation  bilaterally without wheezes or crackles decreased in bases, room air Cardiovascular: Regular rate and rhythm without murmur gallop or rub normal S1 and S2;  systolic blood pressure soft between 90 and 100, trace lower extremity edema, no JVD Abdomen: Minimally tender right upper quadrant without guarding or rebounding, nondistended except for a obesity, soft, bowel sounds positive, no ascites, no appreciable mass Musculoskeletal: No significant cyanosis, clubbing of bilateral lower extremities Neurological: Alert and oriented x3, moves all extremities x4, cranial nerves II through XII grossly intact  Data Reviewed: Basic Metabolic Panel:  Recent Labs Lab 08/02/12 1255 08/02/12 1840 08/02/12 2118 08/03/12 0628 08/03/12 1047  NA 119* 123* 124* 124*  --   K 5.5* 5.0 4.7 4.6  --   CL 71* 77* 78* 80*  --   CO2 29 29 28 27   --   GLUCOSE 92 98 96 99  --   BUN 86* 85* 85* 85*  --   CREATININE 7.15* 6.83* 6.71* 6.20*  --   CALCIUM 11.4* 10.5 10.4 10.0  --   MG  --   --  2.4  --   --   PHOS  --   --  9.3*  --  6.5*   Liver Function Tests:  Recent Labs Lab 08/02/12 1255 08/02/12 2118 08/03/12 0628  AST 19 18 17   ALT 13 12 11   ALKPHOS 40 37* 41  BILITOT 0.3 0.3 0.3  PROT 8.8* 8.2 7.7  ALBUMIN 4.9 4.4 4.1    Recent Labs Lab 08/02/12 1255  LIPASE 79*   No results found for this basename: AMMONIA,  in the last 168 hours CBC:  Recent Labs Lab 08/02/12 1255 08/02/12 2118 08/03/12 0628  WBC 16.3* 15.3* 11.7*  NEUTROABS 12.4* 10.8*  --   HGB 10.1* 9.0* 9.2*  HCT 30.1* 27.0* 28.0*  MCV 86.2 86.3 87.5  PLT 408* 472* 481*   Cardiac Enzymes:  Recent Labs Lab 08/02/12 1246  TROPONINI <0.30   BNP (last 3 results)  Recent Labs  08/02/12 1246  PROBNP 238.3*   CBG:  Recent Labs Lab 08/03/12 0814  GLUCAP 99    Recent Results (from the past 240 hour(s))  CULTURE, BLOOD (ROUTINE X 2)     Status: None   Collection Time    08/02/12  3:10 PM      Result Value Range Status   Specimen Description BLOOD ARM LEFT   Final   Special Requests     Final   Value: BOTTLES DRAWN AEROBIC AND ANAEROBIC 10CC BLUE 5CC RED    Culture  Setup Time 08/02/2012 21:24   Final   Culture     Final   Value:        BLOOD CULTURE RECEIVED NO GROWTH TO DATE CULTURE WILL BE HELD FOR 5 DAYS BEFORE ISSUING A FINAL NEGATIVE REPORT   Report Status PENDING   Incomplete  CULTURE, BLOOD (ROUTINE X 2)     Status: None   Collection Time    08/02/12  3:25 PM      Result Value Range Status   Specimen Description BLOOD ARM RIGHT   Final   Special Requests BOTTLES DRAWN AEROBIC ONLY 10CC   Final   Culture  Setup Time 08/02/2012 21:24   Final   Culture     Final   Value:        BLOOD CULTURE RECEIVED NO GROWTH TO DATE CULTURE WILL BE HELD FOR 5 DAYS BEFORE ISSUING A FINAL NEGATIVE REPORT   Report Status  PENDING   Incomplete  MRSA PCR SCREENING     Status: None   Collection Time    08/02/12  7:15 PM      Result Value Range Status   MRSA by PCR NEGATIVE  NEGATIVE Final   Comment:            The GeneXpert MRSA Assay (FDA     approved for NASAL specimens     only), is one component of a     comprehensive MRSA colonization     surveillance program. It is not     intended to diagnose MRSA     infection nor to guide or     monitor treatment for     MRSA infections.     Studies:  Recent x-ray studies have been reviewed in detail by the Attending Physician  Scheduled Meds:  Reviewed in detail by the Attending Physician    Saima Rizwan,MD [phone] If 7PM-7AM, please contact night-coverage [email or web address] Password [email or web address] 08/03/2012, 12:32 PM   LOS: 1 day

## 2012-08-04 DIAGNOSIS — K5900 Constipation, unspecified: Secondary | ICD-10-CM

## 2012-08-04 LAB — URINE CULTURE: Colony Count: NO GROWTH

## 2012-08-04 LAB — GLUCOSE, CAPILLARY: Glucose-Capillary: 121 mg/dL — ABNORMAL HIGH (ref 70–99)

## 2012-08-04 LAB — BASIC METABOLIC PANEL
Calcium: 9.5 mg/dL (ref 8.4–10.5)
GFR calc non Af Amer: 13 mL/min — ABNORMAL LOW (ref >90)
Glucose, Bld: 97 mg/dL (ref 70–99)
Sodium: 130 mEq/L — ABNORMAL LOW (ref 135–145)

## 2012-08-04 MED ORDER — POLYETHYLENE GLYCOL 3350 17 G PO PACK
17.0000 g | PACK | Freq: Two times a day (BID) | ORAL | Status: DC
  Administered 2012-08-04 – 2012-08-06 (×4): 17 g via ORAL
  Filled 2012-08-04 (×5): qty 1

## 2012-08-04 NOTE — Progress Notes (Signed)
[facility] - PROGRESS NOTE Resident Note   Please see below for attending addendum to resident note.  Subjective:   Feels well, no c/o N/V. No chest pain or SOB. No fatigue. No acute events overnight   Objective:    Vital Signs:   Temp:  [97.9 F (36.6 C)-98.6 F (37 C)] 98.2 F (36.8 C) (03/04 2352) Pulse Rate:  [69-86] 84 (03/05 0700) Resp:  [10-16] 11 (03/05 0700) BP: (83-172)/(34-116) 113/50 mmHg (03/05 0700) SpO2:  [94 %-100 %] 100 % (03/05 0700) Weight:  [378 lb 15.5 oz (171.9 kg)] 378 lb 15.5 oz (171.9 kg) (03/05 0600) Last BM Date: 07/28/12  24-hour weight change: Weight change: -14 lb 1.8 oz (-6.4 kg)  Weight trends: Filed Weights   08/02/12 1905 08/03/12 0500 08/04/12 0600  Weight: 393 lb 1.3 oz (178.3 kg) 378 lb 15.5 oz (171.9 kg) 378 lb 15.5 oz (171.9 kg)    Intake/Output:  03/04 0701 - 03/05 0700 In: 3562.5 [P.O.:1230; I.V.:2332.5] Out: 7100 [Urine:7100] Physical Exam:  General:  Morbid obesity. Well-developed, well-nourished, in no acute distress; alert, appropriate and cooperative throughout examination.   Head:  Normocephalic, atraumatic.   Eyes:  PERRL, EOMI, No signs of anemia or jaundince.   Nose:  Mucous membranes moist, not inflammed, nonerythematous.   Throat:  Oropharynx nonerythematous, no exudate appreciated.   Neck:  No deformities, masses, or tenderness noted.Supple, No carotid Bruits, no JVD.   Lungs:  Normal respiratory effort. Clear to auscultation BL without crackles or wheezes.   Heart:  RRR. S1 and S2 normal without gallop, murmur, or rubs.   Abdomen:  BS normoactive. Soft, Nondistended, non-tender. No masses or organomegaly.   Extremities:  No pretibial edema.   Neurologic:  A&O X3, CN II - XII are grossly intact. Motor strength is 5/5 in the all 4 extremities, Sensations intact to light touch, Cerebellar signs negative.   Skin:  No visible rashes, scars.      Labs: Basic Metabolic Panel:  Recent Labs Lab  08/02/12 1255 08/02/12 1840 08/02/12 2118 08/03/12 0628 08/03/12 1047 08/04/12 0430  NA 119* 123* 124* 124*  --  130*  K 5.5* 5.0 4.7 4.6  --  4.5  CL 71* 77* 78* 80*  --  89*  CO2 29 29 28 27   --  26  GLUCOSE 92 98 96 99  --  97  BUN 86* 85* 85* 85*  --  73*  CREATININE 7.15* 6.83* 6.71* 6.20*  --  4.76*  CALCIUM 11.4* 10.5 10.4 10.0  --  9.5  MG  --   --  2.4  --   --   --   PHOS  --   --  9.3*  --  6.5* 4.6    Liver Function Tests:  Recent Labs Lab 08/02/12 1255 08/02/12 2118 08/03/12 0628  AST 19 18 17   ALT 13 12 11   ALKPHOS 40 37* 41  BILITOT 0.3 0.3 0.3  PROT 8.8* 8.2 7.7  ALBUMIN 4.9 4.4 4.1    Recent Labs Lab 08/02/12 1255  LIPASE 79*   No results found for this basename: AMMONIA,  in the last 168 hours  CBC:  Recent Labs Lab 08/02/12 1255 08/02/12 2118 08/03/12 0628  WBC 16.3* 15.3* 11.7*  NEUTROABS 12.4* 10.8*  --   HGB 10.1* 9.0* 9.2*  HCT 30.1* 27.0* 28.0*  MCV 86.2 86.3 87.5  PLT 408* 472* 481*    Cardiac Enzymes:  Recent Labs Lab 08/02/12 1246  TROPONINI <0.30  BNP: No components found with this basename: POCBNP,   CBG:  Recent Labs Lab 08/03/12 0814  GLUCAP 99    Microbiology: Results for orders placed during the hospital encounter of 08/02/12  CULTURE, BLOOD (ROUTINE X 2)     Status: None   Collection Time    08/02/12  3:10 PM      Result Value Range Status   Specimen Description BLOOD ARM LEFT   Final   Special Requests     Final   Value: BOTTLES DRAWN AEROBIC AND ANAEROBIC 10CC BLUE 5CC RED   Culture  Setup Time 08/02/2012 21:24   Final   Culture     Final   Value:        BLOOD CULTURE RECEIVED NO GROWTH TO DATE CULTURE WILL BE HELD FOR 5 DAYS BEFORE ISSUING A FINAL NEGATIVE REPORT   Report Status PENDING   Incomplete  CULTURE, BLOOD (ROUTINE X 2)     Status: None   Collection Time    08/02/12  3:25 PM      Result Value Range Status   Specimen Description BLOOD ARM RIGHT   Final   Special Requests BOTTLES  DRAWN AEROBIC ONLY 10CC   Final   Culture  Setup Time 08/02/2012 21:24   Final   Culture     Final   Value:        BLOOD CULTURE RECEIVED NO GROWTH TO DATE CULTURE WILL BE HELD FOR 5 DAYS BEFORE ISSUING A FINAL NEGATIVE REPORT   Report Status PENDING   Incomplete  MRSA PCR SCREENING     Status: None   Collection Time    08/02/12  7:15 PM      Result Value Range Status   MRSA by PCR NEGATIVE  NEGATIVE Final   Comment:            The GeneXpert MRSA Assay (FDA     approved for NASAL specimens     only), is one component of a     comprehensive MRSA colonization     surveillance program. It is not     intended to diagnose MRSA     infection nor to guide or     monitor treatment for     MRSA infections.  URINE CULTURE     Status: None   Collection Time    08/02/12 10:16 PM      Result Value Range Status   Specimen Description URINE, RANDOM   Final   Special Requests ADDED [id number]   Final   Culture  Setup Time 08/03/2012 04:08   Final   Colony Count NO GROWTH   Final   Culture NO GROWTH   Final   Report Status 08/04/2012 FINAL   Final    Coagulation Studies:  Recent Labs  08/02/12 1255 08/02/12 2118  LABPROT 13.1 13.7  INR 1.00 1.06    Urinalysis:  Recent Labs  08/02/12 1424 08/02/12 2216  COLORURINE YELLOW YELLOW  LABSPEC 1.012 1.011  PHURINE 6.0 5.5  GLUCOSEU 100* 100*  HGBUR MODERATE* TRACE*  BILIRUBINUR NEGATIVE NEGATIVE  KETONESUR NEGATIVE NEGATIVE  PROTEINUR 30* NEGATIVE  UROBILINOGEN 0.2 0.2  NITRITE NEGATIVE NEGATIVE  LEUKOCYTESUR NEGATIVE NEGATIVE      Imaging: Ct Abdomen Pelvis Wo Contrast  08/02/2012  *RADIOLOGY REPORT*  Clinical Data: Nausea for 5 days, no bowel movement for 5 days, single episode of vomiting, history hypertension, CHF, asthma, schizophrenia, bipolar disorder  CT ABDOMEN AND PELVIS WITHOUT CONTRAST  Technique:  Multidetector  CT imaging of the abdomen and pelvis was performed following the standard protocol without intravenous  contrast. Sagittal and coronal MPR images reconstructed from axial data set.  Patient drank dilute oral contrast for exam but IV contrast was not administered due to renal dysfunction.  Comparison: 02/18/2012  Findings: Lung bases clear. Liver, spleen, pancreas, kidneys, and adrenal glands unremarkable in appearance for noncontrast technique. Unremarkable gallbladder. Small metallic foreign body at cecum, uncertain etiology. Normal appendix.  Significantly distended urinary bladder, measuring 19.0 x 12.0 x 13.1 cm. Small umbilical hernia containing fat. Portion of left anterolateral abdominal wall excluded due to body habitus. Stomach and bowel loops otherwise normal appearance. No mass, adenopathy, free fluid, or inflammatory process. No gross free air or acute osseous findings.  IMPRESSION: Markedly distended urinary bladder. Small umbilical hernia containing fat. Otherwise negative exam.   Original Report Authenticated By: Ulyses Southward, M.D.    Dg Chest 2 View  08/02/2012  *RADIOLOGY REPORT*  Clinical Data: Altered mental status  CHEST - 2 VIEW  Comparison: 03/06/2011, 09/14/2010  Findings: Stable heart size and vascularity.  Minimal streaky basilar atelectasis.  Negative for CHF, pneumonia, collapse, consolidation, effusion or pneumothorax.  Trachea is midline. Degenerative changes of the spine.  IMPRESSION: Stable exam.  No significant acute process   Original Report Authenticated By: Judie Petit. Miles Costain, M.D.    US Renal  08/02/2012  *RADIOLOGY REPORT*  Clinical Data: Acute renal failure, history hypertension, CHF; hematuria, leukocytosis, creatinine 7.15  RENAL/URINARY TRACT ULTRASOUND COMPLETE  Comparison:  Abdominal ultrasound 08/06/2011  Findings:  Right Kidney:  11.8 cm length.  Normal cortical thickness and echogenicity.  No mass, hydronephrosis shadowing calcification.  Left Kidney:  14.1 cm length.  Suboptimally visualized due to body habitus and bowel.  Grossly normal cortical thickness echogenicity. No gross  mass or hydronephrosis.  Bladder:  Distended urinary bladder.  Left ureteral jet visualized. Right ureteral jet was not seen during the exam. No gross mass or wall thickening.  Calculated prevoid bladder volume 2051 ml; patient unable to avoid.  IMPRESSION: Unremarkable sonographic appearance of the kidneys. Markedly distended urinary bladder containing calculated 2051 ml of urine.   Original Report Authenticated By: Ulyses Southward, M.D.       Medications:    Infusions: . sodium chloride 100 mL/hr at 08/04/12 1610    Scheduled Medications: . ARIPiprazole  5 mg Oral QHS  . dicyclomine  20 mg Oral TID AC & HS  . docusate sodium  100 mg Oral BID  . fenofibrate  160 mg Oral Daily  . heparin subcutaneous  5,000 Units Subcutaneous Q8H  . imipramine  50 mg Oral QHS  . lactulose  20 g Oral TID  . levofloxacin (LEVAQUIN) IV  500 mg Intravenous Q48H  . levothyroxine  100 mcg Oral BH-q7a  . morphine  60 mg Oral Q12H  . nicotine  21 mg Transdermal Daily  . pantoprazole  40 mg Oral QAC breakfast  . polyethylene glycol  17 g Oral Daily  . sodium chloride  3 mL Intravenous Q12H  . traZODone  300 mg Oral QHS  . vancomycin  2,000 mg Intravenous Q48H  . venlafaxine  75 mg Oral Daily    PRN Medications: acetaminophen, acetaminophen, albuterol, alprazolam, HYDROcodone-acetaminophen, metoCLOPramide, morphine injection, ondansetron (ZOFRAN) IV, ondansetron, tiZANidine   Assessment/ Plan:    Patient is a 47 y.o. male with a PMHx of HTN, OSA, CHF, hypothyroidism, Bipolar and schizophrenia, who was admitted to [facility] on 08/02/2012 for evaluation of nausea and hypotension. Patient  reports N/V over one month and he continued to take his usual home diuretics. Reports that he has been taking Motrin 600 mg po several times daily for months.   Renal is consulted for acute renal failure.  1. Acute renal failure, better. UOP ~7000 last 24 hours  Recent Labs Lab 08/02/12 1255 08/02/12 1840 08/02/12 2118  08/03/12 0628 08/04/12 0430  CREATININE 7.15* 6.83* 6.71* 6.20* 4.76*    Last Cr was 1.15 in Dec 2013 ( office lab from Dr. Lenise Arena) . No hx of CKD noted.   Etiology of ARF is most likely associated with volume depletion 2/2 decreased oral intake and medication induced including diuretics, ACEI and NSAIDs.  Reports that he has been off Lithium over a year.   - hold home meds including lasix, lisinopril, Metolazone, Spirolactone, K-dur,  - IVF replacement ( ED ordered NS 2 L bolus)  Will continue NS at 100 ml/hour  - will monitor BMP daily  - monitor electrolyte and acid-base imbalance  - monitor cardiac status--caution s/s HF --Hx of diastolic HF.  - No indication for HD( No hyperkalemia, No uremia even though BUN 85, no overt fluid overload, no acid base imbalance)   2. Hyperkalemia, corrected  - likely associated with ARF and his home med of spirolactone and KCL   3. Hyponatremia , better  Recent Labs Lab 08/02/12 1255 08/02/12 1840 08/02/12 2118 08/03/12 0628 08/04/12 0430  NA 119* 123* 124* 124* 130*    Likely acute dropping related to his hypovolemia, diuretic Metolazone, imipramine, effexor, trazadone  - will defer to primary team.  - ED ordered 2 L bolus  - will continue NS 100 ml/hour  - follow up BMP daily   4. VTE: Heparin  Length of Stay: 2 days  Patient history and plan of care reviewed with attending, Dr. Briant Cedar .   Dede Query, MD  PGYII, Internal Medicine Resident 08/04/2012, 8:24 AM I have seen and examined this patient and agree with plan per Dr Ian Bushman.  Renal fx improving.  UO excellent with probabl post ATN diuresis.  Cont IV fluids.  Recheck labs in AM. MATTINGLY,MICHAEL T,MD 08/04/2012 11:05 AM

## 2012-08-04 NOTE — Progress Notes (Signed)
Pt desats on RA while asleep to 85% commenced on Oxygen at 2l [place]

## 2012-08-04 NOTE — Progress Notes (Signed)
[facility] Progress Note [name] TEAM 1 - Stepdown/ICU TEAM   [name]:[id number] DOB: [birth year] DOA: 08/02/2012 PCP: Joycelyn Rua, MD  Brief narrative: 47 year old male patient with multiple medical problems but with apparent underlying normal renal function. Presented to the emergency department with complaints of nausea abdominal pain with increasing abdominal distention over the past one to 2 weeks. Patient normally hypertensive but blood pressure checked at home prior to arrival demonstrated systolic blood pressure in the 80s. The only other symptom the patient was having with dizziness. In the ER patient was found to be hyponatremic with a sodium of 119 and hyperkalemic with potassium of 5.5 and a marked elevation in creatinine up to 7.15 baseline creatinine around 1.42. Calcium was also elevated at 11.4. Was also found to have leukocytosis 16,300. Because of abdominal pain a CT of the abdomen and pelvis was completed that revealed urinary distention. Foley catheter was subsequently placed with greater than 2500 cc return of urine. Chest x-ray and renal ultrasound were also unremarkable.  Assessment/Plan:  Acute kidney failure due to : A) ATN (acute tubular necrosis) B) Dehydration with hyponatremia/ Hyperkalemia C) Hypotension D) bladder outlet obstruction/urinary retention -no prior h/o CKD baseline Scr 1.42 -Scr now down to 4.76 -continue IVF hydration -hold offending meds: Lasix, Aldactone, ACE I, and NSAID's -acidosis currently compensated -hyponatremia potentially influenced by psych meds but has increased significantly after hydration so suspect dehydration/ARF primary cause -try to keep SBP >95 to minimize further renal injury -has high UOP  Urinary retention with incomplete bladder emptying -?cause - likely 2/2 constipation and narcotics -need to leave foley to assure complete bladder emptying until renal function improved further/stabilized -no  apparent BPH and no prior h/o urinary retention sx's    Schizophrenia/Bipolar 1 disorder -cont. home meds  Anemia, iron deficiency -baseline hgb ~ 12 - watch closely given may have acute decrease due to ARF  Leukocytosis, unspecified  -urine cx negative and blood cx's NGTD - dc anbx's  HTN/ Mild LVH (left ventricular hypertrophy) (ECHO 2013) -likely has underlying diastolic dysfunction -use caution with hydration -BP still soft so continue to hold home meds  Chronic pain syndrome/Chronic constipation IBS -cont MS Contin but watch for oversedation in setting of ARF -Have changed home prn Lactulose to scheduled and added colace and Miralax-no BM so far -Has metallic foreign body seen on CT scan at cecum level-suspect may have ingested filling from tooth-no evidence of peritonitis or bowel perforation - will need upright KUB once begins to have BMs to recheck for object  ? Sleep apnea/Obesity -RN documented nocturnal desats so would benefit from nocturnal O2 to use after dc -has not had any formal testing- rec. PSG thru PCP ofc after dc  Asthma Well compensated at present   Hypothyroidism -TSH normal - cont Synthroid  DVT prophylaxis: subcutaneous heparin Code Status: Full Family Communication: Patient and wife Disposition Plan: Transfer to Telemetry  Consultants: Nephrology  Procedures: None  Antibiotics: Levaquin 3/4 >>>3/5 Vancomycin 3/4 >>>3/5  HPI/Subjective: Patient alert-no complaints.No BM yet- tolerated diet adv.  Denies cp, sob, f/c, n/v, or abdom pain.    Objective: Blood pressure 104/46, pulse 81, temperature 97.4 F (36.3 C), temperature source Oral, resp. rate 12, height 6' (1.829 m), weight 171.9 kg (378 lb 15.5 oz), SpO2 94.00%.  Intake/Output Summary (Last 24 hours) at 08/04/12 1056 Last data filed at 08/04/12 0900  Gross per 24 hour  Intake   3140 ml  Output   3800 ml  Net   -  660 ml    Exam: General: No acute respiratory distress Lungs:  Clear to auscultation bilaterally without wheezes or crackles decreased in bases, room air Cardiovascular: Regular rate and rhythm without murmur gallop or rub normal S1 and S2; systolic blood pressure soft between 90 and 100, trace lower extremity edema, no JVD Abdomen: Non tender right upper quadrant without guarding or rebounding, nondistended except for a obesity, soft, bowel sounds positive, no ascites, no appreciable mass Musculoskeletal: No significant cyanosis, clubbing of bilateral lower extremities Neurological: Alert and oriented x3, moves all extremities x4, cranial nerves II through XII grossly intact  Data Reviewed: Basic Metabolic Panel:  Recent Labs Lab 08/02/12 1255 08/02/12 1840 08/02/12 2118 08/03/12 0628 08/03/12 1047 08/04/12 0430  NA 119* 123* 124* 124*  --  130*  K 5.5* 5.0 4.7 4.6  --  4.5  CL 71* 77* 78* 80*  --  89*  CO2 29 29 28 27   --  26  GLUCOSE 92 98 96 99  --  97  BUN 86* 85* 85* 85*  --  73*  CREATININE 7.15* 6.83* 6.71* 6.20*  --  4.76*  CALCIUM 11.4* 10.5 10.4 10.0  --  9.5  MG  --   --  2.4  --   --   --   PHOS  --   --  9.3*  --  6.5* 4.6   Liver Function Tests:  Recent Labs Lab 08/02/12 1255 08/02/12 2118 08/03/12 0628  AST 19 18 17   ALT 13 12 11   ALKPHOS 40 37* 41  BILITOT 0.3 0.3 0.3  PROT 8.8* 8.2 7.7  ALBUMIN 4.9 4.4 4.1    Recent Labs Lab 08/02/12 1255  LIPASE 79*   CBC:  Recent Labs Lab 08/02/12 1255 08/02/12 2118 08/03/12 0628  WBC 16.3* 15.3* 11.7*  NEUTROABS 12.4* 10.8*  --   HGB 10.1* 9.0* 9.2*  HCT 30.1* 27.0* 28.0*  MCV 86.2 86.3 87.5  PLT 408* 472* 481*   Cardiac Enzymes:  Recent Labs Lab 08/02/12 1246  TROPONINI <0.30   BNP (last 3 results)  Recent Labs  08/02/12 1246  PROBNP 238.3*   CBG:  Recent Labs Lab 08/03/12 0814 08/04/12 0829  GLUCAP 99 121*    Recent Results (from the past 240 hour(s))  CULTURE, BLOOD (ROUTINE X 2)     Status: None   Collection Time    08/02/12  3:10 PM       Result Value Range Status   Specimen Description BLOOD ARM LEFT   Final   Special Requests     Final   Value: BOTTLES DRAWN AEROBIC AND ANAEROBIC 10CC BLUE 5CC RED   Culture  Setup Time 08/02/2012 21:24   Final   Culture     Final   Value:        BLOOD CULTURE RECEIVED NO GROWTH TO DATE CULTURE WILL BE HELD FOR 5 DAYS BEFORE ISSUING A FINAL NEGATIVE REPORT   Report Status PENDING   Incomplete  CULTURE, BLOOD (ROUTINE X 2)     Status: None   Collection Time    08/02/12  3:25 PM      Result Value Range Status   Specimen Description BLOOD ARM RIGHT   Final   Special Requests BOTTLES DRAWN AEROBIC ONLY 10CC   Final   Culture  Setup Time 08/02/2012 21:24   Final   Culture     Final   Value:        BLOOD CULTURE RECEIVED NO GROWTH  TO DATE CULTURE WILL BE HELD FOR 5 DAYS BEFORE ISSUING A FINAL NEGATIVE REPORT   Report Status PENDING   Incomplete  MRSA PCR SCREENING     Status: None   Collection Time    08/02/12  7:15 PM      Result Value Range Status   MRSA by PCR NEGATIVE  NEGATIVE Final   Comment:            The GeneXpert MRSA Assay (FDA     approved for NASAL specimens     only), is one component of a     comprehensive MRSA colonization     surveillance program. It is not     intended to diagnose MRSA     infection nor to guide or     monitor treatment for     MRSA infections.  URINE CULTURE     Status: None   Collection Time    08/02/12 10:16 PM      Result Value Range Status   Specimen Description URINE, RANDOM   Final   Special Requests ADDED [id number]   Final   Culture  Setup Time 08/03/2012 04:08   Final   Colony Count NO GROWTH   Final   Culture NO GROWTH   Final   Report Status 08/04/2012 FINAL   Final     Studies:  Recent x-ray studies have been reviewed in detail by the Attending Physician  Scheduled Meds:  Reviewed in detail by the Attending Physician  Junious Silk, ANP [phone]  If 7PM-7AM, please contact night-coverage [email or web address] Password  [email or web address] 08/04/2012, 10:56 AM   LOS: 2 days   I have personally examined this patient and reviewed the entire database. I have reviewed the above note, made any necessary editorial changes, and agree with its content.  Lonia Blood, MD [facility]

## 2012-08-05 DIAGNOSIS — G894 Chronic pain syndrome: Secondary | ICD-10-CM

## 2012-08-05 DIAGNOSIS — F209 Schizophrenia, unspecified: Secondary | ICD-10-CM

## 2012-08-05 DIAGNOSIS — F319 Bipolar disorder, unspecified: Secondary | ICD-10-CM

## 2012-08-05 LAB — CBC
MCH: 28.8 pg (ref 26.0–34.0)
MCHC: 32.1 g/dL (ref 30.0–36.0)
MCV: 89.6 fL (ref 78.0–100.0)
Platelets: 497 10*3/uL — ABNORMAL HIGH (ref 150–400)
RDW: 15.1 % (ref 11.5–15.5)
WBC: 11.6 10*3/uL — ABNORMAL HIGH (ref 4.0–10.5)

## 2012-08-05 LAB — BASIC METABOLIC PANEL
BUN: 45 mg/dL — ABNORMAL HIGH (ref 6–23)
CO2: 31 mEq/L (ref 19–32)
Calcium: 9.6 mg/dL (ref 8.4–10.5)
Creatinine, Ser: 2.71 mg/dL — ABNORMAL HIGH (ref 0.50–1.35)
Glucose, Bld: 118 mg/dL — ABNORMAL HIGH (ref 70–99)

## 2012-08-05 NOTE — Progress Notes (Signed)
[facility] - PROGRESS NOTE Resident Note   Please see below for attending addendum to resident note.  Subjective:   Feels well, no c/o N/V. No chest pain or SOB. No fatigue. No acute events overnight    Objective:    Vital Signs:   Temp:  [97.6 F (36.4 C)-98.7 F (37.1 C)] 97.9 F (36.6 C) (03/06 0700) Pulse Rate:  [74-90] 90 (03/06 0700) Resp:  [13-19] 19 (03/06 0700) BP: (92-141)/(47-59) 140/59 mmHg (03/06 0700) SpO2:  [98 %-100 %] 100 % (03/06 0700) Weight:  [371 lb 4.1 oz (168.4 kg)] 371 lb 4.1 oz (168.4 kg) (03/05 2107) Last BM Date: 07/28/12  24-hour weight change: Weight change: -7 lb 11.5 oz (-3.5 kg)  Weight trends: Filed Weights   08/03/12 0500 08/04/12 0600 08/04/12 2107  Weight: 378 lb 15.5 oz (171.9 kg) 378 lb 15.5 oz (171.9 kg) 371 lb 4.1 oz (168.4 kg)    Intake/Output:  03/05 0701 - 03/06 0700 In: 3620 [P.O.:1320; I.V.:2300] Out: 7250 [Urine:7250]  Physical Exam:  General:  Morbid obesity. Well-developed, well-nourished, in no acute distress; alert, appropriate and cooperative throughout examination.   Head:  Normocephalic, atraumatic.   Eyes:  PERRL, EOMI, No signs of anemia or jaundince.   Nose:  Mucous membranes moist, not inflammed, nonerythematous.   Throat:  Oropharynx nonerythematous, no exudate appreciated.   Neck:  No deformities, masses, or tenderness noted.Supple, No carotid Bruits, no JVD.   Lungs:  Normal respiratory effort. Clear to auscultation BL without crackles or wheezes.   Heart:  RRR. S1 and S2 normal without gallop, murmur, or rubs.   Abdomen:  BS normoactive. Soft, Nondistended, non-tender. No masses or organomegaly.   Extremities:  No pretibial edema.   Neurologic:  A&O X3, CN II - XII are grossly intact. Motor strength is 5/5 in the all 4 extremities, Sensations intact to light touch, Cerebellar signs negative.   Skin:  No visible rashes, scars.     Labs: Basic Metabolic Panel:  Recent Labs Lab  08/02/12 2118 08/03/12 0628 08/03/12 1047 08/04/12 0430 08/05/12 0435  NA 124* 124*  --  130* 134*  K 4.7 4.6  --  4.5 4.8  CL 78* 80*  --  89* 95*  CO2 28 27  --  26 31  GLUCOSE 96 99  --  97 118*  BUN 85* 85*  --  73* 45*  CREATININE 6.71* 6.20*  --  4.76* 2.71*  CALCIUM 10.4 10.0  --  9.5 9.6  MG 2.4  --   --   --   --   PHOS 9.3*  --  6.5* 4.6 2.4    Liver Function Tests:  Recent Labs Lab 08/02/12 1255 08/02/12 2118 08/03/12 0628  AST 19 18 17   ALT 13 12 11   ALKPHOS 40 37* 41  BILITOT 0.3 0.3 0.3  PROT 8.8* 8.2 7.7  ALBUMIN 4.9 4.4 4.1    Recent Labs Lab 08/02/12 1255 08/05/12 0435  LIPASE 79* 36   No results found for this basename: AMMONIA,  in the last 168 hours  CBC:  Recent Labs Lab 08/02/12 1255 08/02/12 2118 08/03/12 0628 08/05/12 0435  WBC 16.3* 15.3* 11.7* 11.6*  NEUTROABS 12.4* 10.8*  --   --   HGB 10.1* 9.0* 9.2* 8.6*  HCT 30.1* 27.0* 28.0* 26.8*  MCV 86.2 86.3 87.5 89.6  PLT 408* 472* 481* 497*    Cardiac Enzymes:  Recent Labs Lab 08/02/12 1246  TROPONINI <0.30    BNP:  No components found with this basename: POCBNP,   CBG:  Recent Labs Lab 08/03/12 0814 08/04/12 0829 08/05/12 0736  GLUCAP 99 121* 98    Microbiology: Results for orders placed during the hospital encounter of 08/02/12  CULTURE, BLOOD (ROUTINE X 2)     Status: None   Collection Time    08/02/12  3:10 PM      Result Value Range Status   Specimen Description BLOOD ARM LEFT   Final   Special Requests     Final   Value: BOTTLES DRAWN AEROBIC AND ANAEROBIC 10CC BLUE 5CC RED   Culture  Setup Time 08/02/2012 21:24   Final   Culture     Final   Value:        BLOOD CULTURE RECEIVED NO GROWTH TO DATE CULTURE WILL BE HELD FOR 5 DAYS BEFORE ISSUING A FINAL NEGATIVE REPORT   Report Status PENDING   Incomplete  CULTURE, BLOOD (ROUTINE X 2)     Status: None   Collection Time    08/02/12  3:25 PM      Result Value Range Status   Specimen Description BLOOD ARM  RIGHT   Final   Special Requests BOTTLES DRAWN AEROBIC ONLY 10CC   Final   Culture  Setup Time 08/02/2012 21:24   Final   Culture     Final   Value:        BLOOD CULTURE RECEIVED NO GROWTH TO DATE CULTURE WILL BE HELD FOR 5 DAYS BEFORE ISSUING A FINAL NEGATIVE REPORT   Report Status PENDING   Incomplete  MRSA PCR SCREENING     Status: None   Collection Time    08/02/12  7:15 PM      Result Value Range Status   MRSA by PCR NEGATIVE  NEGATIVE Final   Comment:            The GeneXpert MRSA Assay (FDA     approved for NASAL specimens     only), is one component of a     comprehensive MRSA colonization     surveillance program. It is not     intended to diagnose MRSA     infection nor to guide or     monitor treatment for     MRSA infections.  URINE CULTURE     Status: None   Collection Time    08/02/12 10:16 PM      Result Value Range Status   Specimen Description URINE, RANDOM   Final   Special Requests ADDED [id number]   Final   Culture  Setup Time 08/03/2012 04:08   Final   Colony Count NO GROWTH   Final   Culture NO GROWTH   Final   Report Status 08/04/2012 FINAL   Final    Coagulation Studies:  Recent Labs  08/02/12 1255 08/02/12 2118  LABPROT 13.1 13.7  INR 1.00 1.06    Urinalysis:    Component Value Date/Time   COLORURINE YELLOW 08/02/2012 2216   APPEARANCEUR CLEAR 08/02/2012 2216   LABSPEC 1.011 08/02/2012 2216   PHURINE 5.5 08/02/2012 2216   GLUCOSEU 100* 08/02/2012 2216   HGBUR TRACE* 08/02/2012 2216   BILIRUBINUR NEGATIVE 08/02/2012 2216   KETONESUR NEGATIVE 08/02/2012 2216   PROTEINUR NEGATIVE 08/02/2012 2216   UROBILINOGEN 0.2 08/02/2012 2216   NITRITE NEGATIVE 08/02/2012 2216   LEUKOCYTESUR NEGATIVE 08/02/2012 2216     Imaging: No results found.    Medications:    Infusions: . sodium chloride 100  mL/hr at 08/05/12 0141    Scheduled Medications: . ARIPiprazole  5 mg Oral QHS  . docusate sodium  100 mg Oral BID  . fenofibrate  160 mg Oral Daily  . heparin  subcutaneous  5,000 Units Subcutaneous Q8H  . imipramine  50 mg Oral QHS  . lactulose  20 g Oral TID  . levothyroxine  100 mcg Oral BH-q7a  . morphine  60 mg Oral Q12H  . nicotine  21 mg Transdermal Daily  . pantoprazole  40 mg Oral QAC breakfast  . polyethylene glycol  17 g Oral BID  . sodium chloride  3 mL Intravenous Q12H  . traZODone  300 mg Oral QHS  . venlafaxine  75 mg Oral Daily    PRN Medications: acetaminophen, acetaminophen, albuterol, alprazolam, HYDROcodone-acetaminophen, metoCLOPramide, morphine injection, ondansetron (ZOFRAN) IV, ondansetron, tiZANidine   Assessment/ Plan:   Patient is a 47 y.o. male with a PMHx of HTN, OSA, CHF, hypothyroidism, Bipolar and schizophrenia, who was admitted to [facility] on 08/02/2012 for evaluation of nausea and hypotension. Patient reports N/V over one month and he continued to take his usual home diuretics. Reports that he has been taking Motrin 600 mg po several times daily for months.  Renal is consulted for acute renal failure.   1. Acute renal failure, better. UOP ~7000 last 24 hours    Recent Labs Lab 08/02/12 1840 08/02/12 2118 08/03/12 0628 08/04/12 0430 08/05/12 0435  CREATININE 6.83* 6.71* 6.20* 4.76* 2.71*     Last Cr was 1.15 in Dec 2013 ( office lab from Dr. Lenise Arena) . No hx of CKD noted.  Etiology of ARF is most likely associated with volume depletion 2/2 decreased oral intake and medication induced including diuretics, ACEI and NSAIDs.  Reports that he has been off Lithium over a year.   - hold home meds including lasix, lisinopril, Metolazone, Spirolactone, K-dur,  - IVF replacement ( ED ordered NS 2 L bolus)  Will continue NS at 100 ml/hour  - will monitor BMP daily  - monitor electrolyte and acid-base imbalance  - monitor cardiac status--caution s/s HF --Hx of diastolic HF.  - No indication for HD( No hyperkalemia, No uremia even though BUN 85, no overt fluid overload, no acid base imbalance)   2. Hyperkalemia,  corrected  - likely associated with ARF and his home med of spirolactone and KCL   3. Hyponatremia , better   Recent Labs Lab 08/02/12 1840 08/02/12 2118 08/03/12 0628 08/04/12 0430 08/05/12 0435  NA 123* 124* 124* 130* 134*    .Likely acute dropping related to his hypovolemia, diuretic Metolazone, imipramine, effexor, trazadone  - will defer to primary team.  - ED ordered 2 L bolus  - will continue NS 100 ml/hour  - follow up BMP daily   4. VTE: Heparin  Length of Stay: 3 days  Patient history and plan of care reviewed with attending, Dr. Briant Cedar.   Dede Query, MD  PGYII, Internal Medicine Resident 08/05/2012, 8:56 AM I have seen and examined this patient and agree with plan per Dr NA.  Renal fx continues to improve but remains polyuric.  Cont IV fluids and then maybe tomorrow DC fluids and see if he can concentrate his urine. Mohannad Olivero T,MD 08/05/2012 9:45 AM

## 2012-08-05 NOTE — Progress Notes (Addendum)
Wife requesting his GI doctor for a consult due to continues Nausea and some vomiting.

## 2012-08-05 NOTE — Progress Notes (Signed)
Patient admitted to 6706 from 2600. Alert and oriented x 4. Skin warm and dry. Respiration even and unlabored. Nauseated and vomited upon arrival with digested food around 30 cc. Medicated with zofran IV.

## 2012-08-05 NOTE — Progress Notes (Signed)
[facility] PROGRESS NOTE Interim History: 47 year old male patient with multiple medical problems but with apparent underlying normal renal function. Presented to the emergency department with complaints of nausea abdominal pain with increasing abdominal distention over the past one to 2 weeks. Patient normally hypertensive but blood pressure checked at home prior to arrival demonstrated systolic blood pressure in the 80s. The only other symptom the patient was having with dizziness. In the ER patient was found to be hyponatremic with a sodium of 119 and hyperkalemic with potassium of 5.5 and a marked elevation in creatinine up to 7.15 baseline creatinine around 1.42. Calcium was also elevated at 11.4. Was also found to have leukocytosis 16,300. Because of abdominal pain a CT of the abdomen and pelvis was completed that revealed urinary distention. Foley catheter was subsequently placed with greater than 2500 cc return of urine. Chest x-ray and renal ultrasound were also unremarkable.   Assessment/Plan:  ATN d(acute tubular necrosis),Vol. Depletion with hyponatremia/ Hyperkalemia, Hypotension, bladder outlet obstruction/urinary retention, multiple insulting medications: - baseline Cr 1.4, now improved. - cont IV fluids. - cont to hold nephrotoxins drug. - Good urine UOP moitor UOP, Cr and electrolytes. Watch out dehydration after polyuric phase.  Urinary retention with incomplete bladder emptying  -?cause - likely 2/2 constipation and narcotics. - cont foley.  Schizophrenia/Bipolar 1 disorder  -cont. home meds  Anemia, iron deficiency  -baseline hgb ~ 12 - watch closely given may have acute decrease due to ARF  HTN/ Mild LVH (left ventricular hypertrophy) (ECHO 2013)  - likely has underlying diastolic dysfunction -use caution with hydration  - BP still soft so continue to hold home meds   Chronic pain syndrome/Chronic constipation IBS  - cont MS Contin. - Have changed home prn  Lactulose to scheduled and added colace and Miralax-no BM so far  - Has metallic foreign body seen on CT scan,  KUB once begins to have BMs to recheck for object     Leukocytosis, unspecified: - due to ATN.   Hyperkalemia - 2/2 to ATN.  Hypothyroidism - TSH normal - cont Synthroid   Bipolar 1 disorder: - lithium   Code Status: Full  Family Communication: Patient and wife  Disposition Plan: Transfer to Telemetry    Consultants:  nehrology  Procedures:  none  Antibiotics: Levaquin 3/4 >>>3/5  Vancomycin 3/4 >>>3/5   HPI/Subjective: Feels better no complains.  Objective: Filed Vitals:   08/04/12 1612 08/04/12 2026 08/04/12 2107 08/05/12 0551  BP: 112/48 92/47 109/57 141/57  Pulse: 85 74 76 89  Temp: 98.2 F (36.8 C) 97.6 F (36.4 C) 98.7 F (37.1 C) 98 F (36.7 C)  TempSrc: Oral Oral Oral Oral  Resp: 13 15 17 18   Height:      Weight:   168.4 kg (371 lb 4.1 oz)   SpO2: 100% 100% 98% 100%    Intake/Output Summary (Last 24 hours) at 08/05/12 0981 Last data filed at 08/05/12 1914  Gross per 24 hour  Intake   3280 ml  Output   6250 ml  Net  -2970 ml   Filed Weights   08/03/12 0500 08/04/12 0600 08/04/12 2107  Weight: 171.9 kg (378 lb 15.5 oz) 171.9 kg (378 lb 15.5 oz) 168.4 kg (371 lb 4.1 oz)    Exam:  General: Alert, awake, oriented x3, in no acute distress. Morbidly obese. HEENT: No bruits, no goiter.  Heart: Regular rate and rhythm, without murmurs, rubs, gallops.  Lungs: Good air movement, bilateral air movement.  Abdomen: Soft,  nontender, nondistended, positive bowel sounds.  Neuro: Grossly intact, nonfocal.   Data Reviewed: Basic Metabolic Panel:  Recent Labs Lab 08/02/12 1840 08/02/12 2118 08/03/12 0628 08/03/12 1047 08/04/12 0430 08/05/12 0435  NA 123* 124* 124*  --  130* 134*  K 5.0 4.7 4.6  --  4.5 4.8  CL 77* 78* 80*  --  89* 95*  CO2 29 28 27   --  26 31  GLUCOSE 98 96 99  --  97 118*  BUN 85* 85* 85*  --  73* 45*   CREATININE 6.83* 6.71* 6.20*  --  4.76* 2.71*  CALCIUM 10.5 10.4 10.0  --  9.5 9.6  MG  --  2.4  --   --   --   --   PHOS  --  9.3*  --  6.5* 4.6 2.4   Liver Function Tests:  Recent Labs Lab 08/02/12 1255 08/02/12 2118 08/03/12 0628  AST 19 18 17   ALT 13 12 11   ALKPHOS 40 37* 41  BILITOT 0.3 0.3 0.3  PROT 8.8* 8.2 7.7  ALBUMIN 4.9 4.4 4.1    Recent Labs Lab 08/02/12 1255 08/05/12 0435  LIPASE 79* 36   No results found for this basename: AMMONIA,  in the last 168 hours CBC:  Recent Labs Lab 08/02/12 1255 08/02/12 2118 08/03/12 0628 08/05/12 0435  WBC 16.3* 15.3* 11.7* 11.6*  NEUTROABS 12.4* 10.8*  --   --   HGB 10.1* 9.0* 9.2* 8.6*  HCT 30.1* 27.0* 28.0* 26.8*  MCV 86.2 86.3 87.5 89.6  PLT 408* 472* 481* 497*   Cardiac Enzymes:  Recent Labs Lab 08/02/12 1246  TROPONINI <0.30   BNP (last 3 results)  Recent Labs  08/02/12 1246  PROBNP 238.3*   CBG:  Recent Labs Lab 08/03/12 0814 08/04/12 0829 08/05/12 0736  GLUCAP 99 121* 98    Recent Results (from the past 240 hour(s))  CULTURE, BLOOD (ROUTINE X 2)     Status: None   Collection Time    08/02/12  3:10 PM      Result Value Range Status   Specimen Description BLOOD ARM LEFT   Final   Special Requests     Final   Value: BOTTLES DRAWN AEROBIC AND ANAEROBIC 10CC BLUE 5CC RED   Culture  Setup Time 08/02/2012 21:24   Final   Culture     Final   Value:        BLOOD CULTURE RECEIVED NO GROWTH TO DATE CULTURE WILL BE HELD FOR 5 DAYS BEFORE ISSUING A FINAL NEGATIVE REPORT   Report Status PENDING   Incomplete  CULTURE, BLOOD (ROUTINE X 2)     Status: None   Collection Time    08/02/12  3:25 PM      Result Value Range Status   Specimen Description BLOOD ARM RIGHT   Final   Special Requests BOTTLES DRAWN AEROBIC ONLY 10CC   Final   Culture  Setup Time 08/02/2012 21:24   Final   Culture     Final   Value:        BLOOD CULTURE RECEIVED NO GROWTH TO DATE CULTURE WILL BE HELD FOR 5 DAYS BEFORE  ISSUING A FINAL NEGATIVE REPORT   Report Status PENDING   Incomplete  MRSA PCR SCREENING     Status: None   Collection Time    08/02/12  7:15 PM      Result Value Range Status   MRSA by PCR NEGATIVE  NEGATIVE Final  Comment:            The GeneXpert MRSA Assay (FDA     approved for NASAL specimens     only), is one component of a     comprehensive MRSA colonization     surveillance program. It is not     intended to diagnose MRSA     infection nor to guide or     monitor treatment for     MRSA infections.  URINE CULTURE     Status: None   Collection Time    08/02/12 10:16 PM      Result Value Range Status   Specimen Description URINE, RANDOM   Final   Special Requests ADDED [id number]   Final   Culture  Setup Time 08/03/2012 04:08   Final   Colony Count NO GROWTH   Final   Culture NO GROWTH   Final   Report Status 08/04/2012 FINAL   Final     Studies: No results found.  Scheduled Meds: . ARIPiprazole  5 mg Oral QHS  . docusate sodium  100 mg Oral BID  . fenofibrate  160 mg Oral Daily  . heparin subcutaneous  5,000 Units Subcutaneous Q8H  . imipramine  50 mg Oral QHS  . lactulose  20 g Oral TID  . levothyroxine  100 mcg Oral BH-q7a  . morphine  60 mg Oral Q12H  . nicotine  21 mg Transdermal Daily  . pantoprazole  40 mg Oral QAC breakfast  . polyethylene glycol  17 g Oral BID  . sodium chloride  3 mL Intravenous Q12H  . traZODone  300 mg Oral QHS  . venlafaxine  75 mg Oral Daily   Continuous Infusions: . sodium chloride 100 mL/hr at 08/05/12 0141     Marinda Elk  [facility] Pager [phone]. If 8PM-8AM, please contact night-coverage at [email or web address], password [facility] 08/05/2012, 8:22 AM  LOS: 3 days

## 2012-08-06 DIAGNOSIS — F319 Bipolar disorder, unspecified: Secondary | ICD-10-CM

## 2012-08-06 DIAGNOSIS — N170 Acute kidney failure with tubular necrosis: Secondary | ICD-10-CM

## 2012-08-06 DIAGNOSIS — G894 Chronic pain syndrome: Secondary | ICD-10-CM

## 2012-08-06 LAB — BASIC METABOLIC PANEL
BUN: 19 mg/dL (ref 6–23)
Chloride: 92 mEq/L — ABNORMAL LOW (ref 96–112)
Creatinine, Ser: 1.51 mg/dL — ABNORMAL HIGH (ref 0.50–1.35)
GFR calc Af Amer: 61 mL/min — ABNORMAL LOW (ref >90)
GFR calc non Af Amer: 53 mL/min — ABNORMAL LOW (ref >90)
Glucose, Bld: 102 mg/dL — ABNORMAL HIGH (ref 70–99)
Potassium: 4.3 mEq/L (ref 3.5–5.1)

## 2012-08-06 MED ORDER — TAMSULOSIN HCL 0.4 MG PO CAPS: 0.4000 mg | ORAL_CAPSULE | Freq: Every day | ORAL | Status: DC

## 2012-08-06 MED ORDER — LISINOPRIL 10 MG PO TABS: 10.0000 mg | ORAL_TABLET | Freq: Every day | ORAL | Status: DC

## 2012-08-06 NOTE — Discharge Summary (Signed)
Physician Discharge Summary  [name] ZOX:[id number] DOB: 01/07/1966 DOA: 08/02/2012  PCP: Joycelyn Rua, MD  Admit date: 08/02/2012 Discharge date: 08/06/2012  Time spent: 30 minutes  Recommendations for Outpatient Follow-up:  1. Follow up with Dr. Evette Cristal  Discharge Diagnoses:  Active Problems:   Hyperlipemia   Sleep apnea   Asthma   Schizophrenia   Hypothyroidism   Bipolar 1 disorder   Obesity   Anemia, iron deficiency   Hypotension   Urinary retention with incomplete bladder emptying   Leukocytosis, unspecified   Dehydration with hyponatremia   Acute kidney failure   Hyperkalemia   ATN (acute tubular necrosis)   mild LVH (left ventricular hypertrophy) (ECHO 2013)   Chronic pain syndrome   Chronic constipation   Discharge Condition: stable  Diet recommendation: heart healthy diet  Filed Weights   08/03/12 0500 08/04/12 0600 08/04/12 2107  Weight: 171.9 kg (378 lb 15.5 oz) 171.9 kg (378 lb 15.5 oz) 168.4 kg (371 lb 4.1 oz)    History of present illness:  47 year old morbidly obese male with quite a significant past medical history including OHS/OSA, history of CHF (although based on 2 D ECHO in 04/2012 EF 60% and proBNP never significantly elevated per EPIC review) for which he is on diuretics, significant psychiatric disorders bipolar, depression and schizophrenia who presented to ED with complaints of nausea and abdominal pain and distention over past 1-2 weeks prior to this admission. In addition, BP checked at home was in low 80's. Patient denies chest pain, shortness of breath, palpitations. He did report some dizziness but no loss of consciousness. No reports of blood in stool or urine. No fever or chills or cough. No headaches, no blurry vision.  In ED, his BMET and CBC was all abnormal from hyponatremia of 119, hyperkalemia of 5.5, creatinine of 7.15 and calcium 11.4. CBC revealed leukocytosis of 16.3, hemoglobin of 10.1. Further, CT abdomen/pelvis revealed  abdominal distention. CXR and renal ultrasound were essentially unremarkable.  PCCM and nephrology consulted by ED physician.   Hospital Course:  ATN d(acute tubular necrosis),Vol. Depletion with hyponatremia/ Hyperkalemia, Hypotension, bladder outlet obstruction/urinary retention, multiple insulting medications:  - treated aggressively with IV fluids, renal was consulted. They recommended to treat with IV as they thought is was secondary to decrease vol depletion and and medications - improved with  IV fluids.  - Held nephrotoxins drug.  - Good urine UOP moitor UOP, Cr and electrolytes. They continue to improved will resume ACE in 2 weeks.  Urinary retention with incomplete bladder emptying  -?cause - likely 2/2 constipation and narcotics.  - foley transietn. - started flomax.   Schizophrenia/Bipolar 1 disorder  -cont. home meds   Anemia, iron deficiency  -baseline hgb ~ 12 - watch closely given may have acute decrease due to ARF   HTN/ Mild LVH (left ventricular hypertrophy) (ECHO 2013)  - likely has underlying diastolic dysfunction -use caution with hydration  - BP still soft so continue to hold home meds   Chronic pain syndrome/Chronic constipation IBS  - cont MS Contin.  - Have changed home prn Lactulose to scheduled and added colace and Miralax-no BM so far  - Has metallic foreign body seen on CT scan, KUB once begins to have BMs to recheck for object   Leukocytosis, unspecified:  - due to ATN.   Hyperkalemia  - 2/2 to ATN.  - resolved with IV fluids.  Hypothyroidism  - TSH normal - cont Synthroid   Bipolar 1 disorder:  -  lithium   Procedures: None Levaquin 3/4 >>>3/5  Vancomycin 3/4 >>>3/5   Consultations:  Renal   Discharge Exam: Filed Vitals:   08/05/12 1308 08/05/12 1647 08/05/12 2135 08/06/12 0553  BP: 148/67 149/63 135/64 142/72  Pulse: 88 93 91 103  Temp: 98.2 F (36.8 C) 98.7 F (37.1 C) 98.6 F (37 C) 98.7 F (37.1 C)  TempSrc:   Oral  Oral  Resp: 17 17 18    Height:      Weight:      SpO2: 98% 99% 100% 95%    General: A&O x3 Cardiovascular: RRR Respiratory: good air movement CTA B/L  Discharge Instructions     Medication List    TAKE these medications       ABILIFY 5 MG tablet  Generic drug:  ARIPiprazole  Take 5 mg by mouth at bedtime.     albuterol 108 (90 BASE) MCG/ACT inhaler  Commonly known as:  PROVENTIL HFA;VENTOLIN HFA  Inhale 2 puffs into the lungs every 4 (four) hours as needed for wheezing.     alprazolam 2 MG tablet  Commonly known as:  XANAX  Take 1-2 mg by mouth 4 (four) times daily as needed for anxiety.     dicyclomine 20 MG tablet  Commonly known as:  BENTYL  Take 1 tablet by mouth 4 times daily.     esomeprazole 40 MG capsule  Commonly known as:  NEXIUM  Take 40 mg by mouth at bedtime.     fenofibrate 160 MG tablet  Take 160 mg by mouth daily.     furosemide 80 MG tablet  Commonly known as:  LASIX  Take 80 mg by mouth 2 (two) times daily.     HYDROcodone-acetaminophen 10-325 MG per tablet  Commonly known as:  NORCO  Take 1 tablet by mouth every 6 (six) hours as needed for pain. For pain     imipramine 50 MG tablet  Commonly known as:  TOFRANIL  Take 50 mg by mouth at bedtime.     lactulose 10 GM/15ML solution  Commonly known as:  CHRONULAC  Take 30 mLs by mouth 3 (three) times daily as needed (for constipation).     levothyroxine 100 MCG tablet  Commonly known as:  SYNTHROID, LEVOTHROID  Take 100 mcg by mouth every morning.     lisinopril 10 MG tablet  Commonly known as:  PRINIVIL,ZESTRIL  Take 1 tablet (10 mg total) by mouth daily.  Start taking on:  08/20/2012     metoCLOPramide 10 MG tablet  Commonly known as:  REGLAN  Take 10 mg by mouth 4 (four) times daily as needed (for stomach pain).     metolazone 2.5 MG tablet  Commonly known as:  ZAROXOLYN  Take 2.5 mg by mouth daily.     morphine 60 MG 12 hr tablet  Commonly known as:  MS CONTIN  Take 60 mg by  mouth every 12 (twelve) hours. As needed for pain     ondansetron 4 MG tablet  Commonly known as:  ZOFRAN  Take 4 mg by mouth every 8 (eight) hours as needed for nausea.     polyethylene glycol powder powder  Commonly known as:  GLYCOLAX/MIRALAX  Take 17 g by mouth daily as needed (for constipation).     potassium chloride SA 20 MEQ tablet  Commonly known as:  K-DUR,KLOR-CON  Take 20 mEq by mouth daily.     spironolactone 50 MG tablet  Commonly known as:  ALDACTONE  Take 50  mg by mouth daily.     tiZANidine 4 MG tablet  Commonly known as:  ZANAFLEX  Take 4-8 mg by mouth every 8 (eight) hours as needed (for muscle spasm).     traZODone 150 MG tablet  Commonly known as:  DESYREL  Take 300 mg by mouth at bedtime.     venlafaxine 75 MG tablet  Commonly known as:  EFFEXOR  Take 75 mg by mouth daily.          The results of significant diagnostics from this hospitalization (including imaging, microbiology, ancillary and laboratory) are listed below for reference.    Significant Diagnostic Studies: Ct Abdomen Pelvis Wo Contrast  08/02/2012  *RADIOLOGY REPORT*  Clinical Data: Nausea for 5 days, no bowel movement for 5 days, single episode of vomiting, history hypertension, CHF, asthma, schizophrenia, bipolar disorder  CT ABDOMEN AND PELVIS WITHOUT CONTRAST  Technique:  Multidetector CT imaging of the abdomen and pelvis was performed following the standard protocol without intravenous contrast. Sagittal and coronal MPR images reconstructed from axial data set.  Patient drank dilute oral contrast for exam but IV contrast was not administered due to renal dysfunction.  Comparison: 02/18/2012  Findings: Lung bases clear. Liver, spleen, pancreas, kidneys, and adrenal glands unremarkable in appearance for noncontrast technique. Unremarkable gallbladder. Small metallic foreign body at cecum, uncertain etiology. Normal appendix.  Significantly distended urinary bladder, measuring 19.0 x 12.0 x  13.1 cm. Small umbilical hernia containing fat. Portion of left anterolateral abdominal wall excluded due to body habitus. Stomach and bowel loops otherwise normal appearance. No mass, adenopathy, free fluid, or inflammatory process. No gross free air or acute osseous findings.  IMPRESSION: Markedly distended urinary bladder. Small umbilical hernia containing fat. Otherwise negative exam.   Original Report Authenticated By: Ulyses Southward, M.D.    Dg Chest 2 View  08/02/2012  *RADIOLOGY REPORT*  Clinical Data: Altered mental status  CHEST - 2 VIEW  Comparison: 03/06/2011, 09/14/2010  Findings: Stable heart size and vascularity.  Minimal streaky basilar atelectasis.  Negative for CHF, pneumonia, collapse, consolidation, effusion or pneumothorax.  Trachea is midline. Degenerative changes of the spine.  IMPRESSION: Stable exam.  No significant acute process   Original Report Authenticated By: Judie Petit. Miles Costain, M.D.    US Renal  08/02/2012  *RADIOLOGY REPORT*  Clinical Data: Acute renal failure, history hypertension, CHF; hematuria, leukocytosis, creatinine 7.15  RENAL/URINARY TRACT ULTRASOUND COMPLETE  Comparison:  Abdominal ultrasound 08/06/2011  Findings:  Right Kidney:  11.8 cm length.  Normal cortical thickness and echogenicity.  No mass, hydronephrosis shadowing calcification.  Left Kidney:  14.1 cm length.  Suboptimally visualized due to body habitus and bowel.  Grossly normal cortical thickness echogenicity. No gross mass or hydronephrosis.  Bladder:  Distended urinary bladder.  Left ureteral jet visualized. Right ureteral jet was not seen during the exam. No gross mass or wall thickening.  Calculated prevoid bladder volume 2051 ml; patient unable to avoid.  IMPRESSION: Unremarkable sonographic appearance of the kidneys. Markedly distended urinary bladder containing calculated 2051 ml of urine.   Original Report Authenticated By: Ulyses Southward, M.D.     Microbiology: Recent Results (from the past 240 hour(s))   CULTURE, BLOOD (ROUTINE X 2)     Status: None   Collection Time    08/02/12  3:10 PM      Result Value Range Status   Specimen Description BLOOD ARM LEFT   Final   Special Requests     Final   Value: BOTTLES DRAWN AEROBIC AND  ANAEROBIC 10CC BLUE 5CC RED   Culture  Setup Time 08/02/2012 21:24   Final   Culture     Final   Value:        BLOOD CULTURE RECEIVED NO GROWTH TO DATE CULTURE WILL BE HELD FOR 5 DAYS BEFORE ISSUING A FINAL NEGATIVE REPORT   Report Status PENDING   Incomplete  CULTURE, BLOOD (ROUTINE X 2)     Status: None   Collection Time    08/02/12  3:25 PM      Result Value Range Status   Specimen Description BLOOD ARM RIGHT   Final   Special Requests BOTTLES DRAWN AEROBIC ONLY 10CC   Final   Culture  Setup Time 08/02/2012 21:24   Final   Culture     Final   Value:        BLOOD CULTURE RECEIVED NO GROWTH TO DATE CULTURE WILL BE HELD FOR 5 DAYS BEFORE ISSUING A FINAL NEGATIVE REPORT   Report Status PENDING   Incomplete  MRSA PCR SCREENING     Status: None   Collection Time    08/02/12  7:15 PM      Result Value Range Status   MRSA by PCR NEGATIVE  NEGATIVE Final   Comment:            The GeneXpert MRSA Assay (FDA     approved for NASAL specimens     only), is one component of a     comprehensive MRSA colonization     surveillance program. It is not     intended to diagnose MRSA     infection nor to guide or     monitor treatment for     MRSA infections.  URINE CULTURE     Status: None   Collection Time    08/02/12 10:16 PM      Result Value Range Status   Specimen Description URINE, RANDOM   Final   Special Requests ADDED [id number]   Final   Culture  Setup Time 08/03/2012 04:08   Final   Colony Count NO GROWTH   Final   Culture NO GROWTH   Final   Report Status 08/04/2012 FINAL   Final     Labs: Basic Metabolic Panel:  Recent Labs Lab 08/02/12 1840 08/02/12 2118 08/03/12 0628 08/03/12 1047 08/04/12 0430 08/05/12 0435  NA 123* 124* 124*  --  130*  134*  K 5.0 4.7 4.6  --  4.5 4.8  CL 77* 78* 80*  --  89* 95*  CO2 29 28 27   --  26 31  GLUCOSE 98 96 99  --  97 118*  BUN 85* 85* 85*  --  73* 45*  CREATININE 6.83* 6.71* 6.20*  --  4.76* 2.71*  CALCIUM 10.5 10.4 10.0  --  9.5 9.6  MG  --  2.4  --   --   --   --   PHOS  --  9.3*  --  6.5* 4.6 2.4   Liver Function Tests:  Recent Labs Lab 08/02/12 1255 08/02/12 2118 08/03/12 0628  AST 19 18 17   ALT 13 12 11   ALKPHOS 40 37* 41  BILITOT 0.3 0.3 0.3  PROT 8.8* 8.2 7.7  ALBUMIN 4.9 4.4 4.1    Recent Labs Lab 08/02/12 1255 08/05/12 0435  LIPASE 79* 36   No results found for this basename: AMMONIA,  in the last 168 hours CBC:  Recent Labs Lab 08/02/12 1255 08/02/12 2118 08/03/12 0628 08/05/12  0435  WBC 16.3* 15.3* 11.7* 11.6*  NEUTROABS 12.4* 10.8*  --   --   HGB 10.1* 9.0* 9.2* 8.6*  HCT 30.1* 27.0* 28.0* 26.8*  MCV 86.2 86.3 87.5 89.6  PLT 408* 472* 481* 497*   Cardiac Enzymes:  Recent Labs Lab 08/02/12 1246  TROPONINI <0.30   BNP: BNP (last 3 results)  Recent Labs  08/02/12 1246  PROBNP 238.3*   CBG:  Recent Labs Lab 08/03/12 0814 08/04/12 0829 08/05/12 0736 08/06/12 0726  GLUCAP 99 121* 98 103*       Signed:  FELIZ ORTIZ, ABRAHAM  [facility] 08/06/2012, 9:09 AM

## 2012-08-06 NOTE — Progress Notes (Signed)
Patient discharged home with wife. Patient was given discharge instructions and prescriptions. Patient was told of appointments. Patient was told to contact the doctor with questions or concerns. Patient was stable upon discharge.

## 2012-08-06 NOTE — Evaluation (Signed)
Physical Therapy Evaluation Patient Details Name: [name] MRN: [id number] DOB: 04-13-1966 Today's Date: 08/06/2012 Time: 4098-1191 PT Time Calculation (min): 13 min  PT Assessment / Plan / Recommendation Clinical Impression  47 y.o. male admitted with acute kidney failure. Pt is independent with mobility, he walked 180' without assistive device. Independent with transfers. No acute nor f/u PT needs, no DME needed. OK to DC home from PT standpoint.     PT Assessment  Patent does not need any further PT services    Follow Up Recommendations  No PT follow up    Does the patient have the potential to tolerate intense rehabilitation      Barriers to Discharge        Equipment Recommendations  None recommended by PT    Recommendations for Other Services     Frequency      Precautions / Restrictions Restrictions Weight Bearing Restrictions: No   Pertinent Vitals/Pain **pt reports chronic LBP Meds requested*      Mobility  Bed Mobility Bed Mobility: Not assessed Transfers Transfers: Sit to Stand;Stand to Sit Sit to Stand: 6: Modified independent (Device/Increase time);From bed Stand to Sit: 6: Modified independent (Device/Increase time);To bed Ambulation/Gait Ambulation/Gait Assistance: 7: Independent Ambulation Distance (Feet): 180 Feet Assistive device: None Gait Pattern: Within Functional Limits;Wide base of support General Gait Details: no LOB, wide BOS 2* body habitus    Exercises     PT Diagnosis:    PT Problem List:   PT Treatment Interventions:     PT Goals    Visit Information  Last PT Received On: 08/06/12 Assistance Needed: +1    Subjective Data  Subjective: I can walk.  Patient Stated Goal: go home   Prior Functioning  Home Living Lives With: Spouse Available Help at Discharge: Family;Personal care attendant Type of Home: House Home Access: Level entry Home Layout: Two level;Able to live on main level with bedroom/bathroom Bathroom  Shower/Tub: Walk-in shower Home Adaptive Equipment: Bedside commode/3-in-1;Walker - rolling;Shower chair with back Prior Function Level of Independence: Independent Vocation: On disability Communication Communication: No difficulties    Cognition  Cognition Overall Cognitive Status: Appears within functional limits for tasks assessed/performed Arousal/Alertness: Awake/alert Orientation Level: Appears intact for tasks assessed Behavior During Session: [facility] for tasks performed    Extremity/Trunk Assessment Right Upper Extremity Assessment RUE ROM/Strength/Tone: [facility] for tasks assessed Left Upper Extremity Assessment LUE ROM/Strength/Tone: WFL for tasks assessed Right Lower Extremity Assessment RLE ROM/Strength/Tone: Within functional levels RLE Sensation: WFL - Light Touch RLE Coordination: WFL - gross/fine motor Left Lower Extremity Assessment LLE ROM/Strength/Tone: Within functional levels LLE Sensation: WFL - decreased to light touch L foot (chronic) LLE Coordination: WFL - gross/fine motor Trunk Assessment Trunk Assessment: Normal   Balance    End of Session PT - End of Session Activity Tolerance: Patient tolerated treatment well Patient left: in bed;with family/visitor present Nurse Communication: Mobility status  GP     Ralene Bathe Kistler 08/06/2012, 9:50 AM  [phone]

## 2012-08-08 LAB — CULTURE, BLOOD (ROUTINE X 2)

## 2012-08-25 ENCOUNTER — Other Ambulatory Visit (HOSPITAL_COMMUNITY): Payer: Medicare Other

## 2012-09-01 ENCOUNTER — Encounter: Payer: Self-pay | Admitting: Cardiology

## 2012-09-01 DIAGNOSIS — I872 Venous insufficiency (chronic) (peripheral): Secondary | ICD-10-CM | POA: Insufficient documentation

## 2012-09-01 DIAGNOSIS — I10 Essential (primary) hypertension: Secondary | ICD-10-CM | POA: Insufficient documentation

## 2012-09-01 DIAGNOSIS — Z789 Other specified health status: Secondary | ICD-10-CM | POA: Insufficient documentation

## 2012-09-01 DIAGNOSIS — R9430 Abnormal result of cardiovascular function study, unspecified: Secondary | ICD-10-CM | POA: Insufficient documentation

## 2012-09-01 DIAGNOSIS — T56891A Toxic effect of other metals, accidental (unintentional), initial encounter: Secondary | ICD-10-CM | POA: Insufficient documentation

## 2012-09-01 DIAGNOSIS — M1990 Unspecified osteoarthritis, unspecified site: Secondary | ICD-10-CM | POA: Insufficient documentation

## 2012-09-01 DIAGNOSIS — R609 Edema, unspecified: Secondary | ICD-10-CM | POA: Insufficient documentation

## 2012-09-03 ENCOUNTER — Ambulatory Visit: Payer: Medicare Other | Admitting: Cardiology

## 2012-09-09 ENCOUNTER — Ambulatory Visit (INDEPENDENT_AMBULATORY_CARE_PROVIDER_SITE_OTHER): Payer: Medicare Other | Admitting: Cardiology

## 2012-09-09 ENCOUNTER — Encounter: Payer: Self-pay | Admitting: Cardiology

## 2012-09-09 VITALS — BP 127/73 | HR 105 | Ht 73.0 in | Wt >= 6400 oz

## 2012-09-09 DIAGNOSIS — I509 Heart failure, unspecified: Secondary | ICD-10-CM

## 2012-09-09 DIAGNOSIS — I959 Hypotension, unspecified: Secondary | ICD-10-CM

## 2012-09-09 DIAGNOSIS — R339 Retention of urine, unspecified: Secondary | ICD-10-CM

## 2012-09-09 DIAGNOSIS — I5032 Chronic diastolic (congestive) heart failure: Secondary | ICD-10-CM

## 2012-09-09 DIAGNOSIS — I10 Essential (primary) hypertension: Secondary | ICD-10-CM

## 2012-09-09 NOTE — Progress Notes (Signed)
HPI  The patient is seen to followup his cardiac status and to follow him post hospitalization. The patient has known diastolic CHF. He's had volume overload in the past. He is on diuretics. I saw him last in April, 2013. He was seen last in our office in November, 2013. The patient was admitted to the hospital on August 02, 2012. He had urinary retention with incomplete bladder emptying. It was felt that this was likely due to multiple medications. He was started on Flomax. He had significant renal dysfunction. This improved and he stabilized. As part of today's evaluation I have reviewed completely his hospital records. I have updated his electronic medical record.  Allergies  Allergen Reactions  . Aspirin Other (See Comments)    Causes asthmatic symptoms  . Statins     Joint pain   . Penicillins Rash    Current Outpatient Prescriptions  Medication Sig Dispense Refill  . ABILIFY 5 MG tablet Take 5 mg by mouth at bedtime.       Marland Kitchen albuterol (PROVENTIL HFA;VENTOLIN HFA) 108 (90 BASE) MCG/ACT inhaler Inhale 2 puffs into the lungs every 4 (four) hours as needed for wheezing.      Marland Kitchen alprazolam (XANAX) 2 MG tablet Take 1-2 mg by mouth 4 (four) times daily as needed for anxiety.       . diazepam (VALIUM) 10 MG tablet 10 mg every 12 (twelve) hours as needed.       . dicyclomine (BENTYL) 20 MG tablet Take 1 tablet by mouth 4 times daily.      Marland Kitchen esomeprazole (NEXIUM) 40 MG capsule Take 40 mg by mouth at bedtime.        . fenofibrate 160 MG tablet Take 160 mg by mouth daily.      . furosemide (LASIX) 80 MG tablet Take 80 mg by mouth 2 (two) times daily.      Marland Kitchen HYDROcodone-acetaminophen (NORCO) 10-325 MG per tablet Take 1 tablet by mouth every 6 (six) hours as needed for pain. For pain      . lactulose (CHRONULAC) 10 GM/15ML solution Take 30 mLs by mouth 3 (three) times daily as needed (for constipation).       Marland Kitchen levothyroxine (SYNTHROID, LEVOTHROID) 100 MCG tablet Take 100 mcg by mouth every  morning.        Marland Kitchen lisinopril (PRINIVIL,ZESTRIL) 10 MG tablet Take 10 mg by mouth. 1/2 tablet as needed      . metoCLOPramide (REGLAN) 10 MG tablet Take 10 mg by mouth 4 (four) times daily as needed (for stomach pain).       Marland Kitchen metolazone (ZAROXOLYN) 2.5 MG tablet Take 2.5 mg by mouth daily.      Marland Kitchen morphine (MS CONTIN) 60 MG 12 hr tablet Take 60 mg by mouth every 12 (twelve) hours. As needed for pain      . ondansetron (ZOFRAN) 4 MG tablet Take 4 mg by mouth every 8 (eight) hours as needed for nausea.       . polyethylene glycol powder (GLYCOLAX/MIRALAX) powder Take 17 g by mouth daily as needed (for constipation).       Marland Kitchen spironolactone (ALDACTONE) 50 MG tablet Take 50 mg by mouth daily.       Marland Kitchen tiZANidine (ZANAFLEX) 4 MG tablet Take 4-8 mg by mouth every 8 (eight) hours as needed (for muscle spasm).       Marland Kitchen venlafaxine (EFFEXOR) 75 MG tablet Take 75 mg by mouth daily.       Marland Kitchen  tamsulosin (FLOMAX) 0.4 MG CAPS Take 1 capsule (0.4 mg total) by mouth daily after supper.  30 capsule  0   No current facility-administered medications for this visit.    History   Social History  . Marital Status: Married    Spouse Name: N/A    Number of Children: N/A  . Years of Education: N/A   Occupational History  . Not on file.   Social History Main Topics  . Smoking status: Current Every Day Smoker -- 1.50 packs/day for 20 years    Types: Cigarettes  . Smokeless tobacco: Never Used  . Alcohol Use: No  . Drug Use: Not on file  . Sexually Active: Not on file   Other Topics Concern  . Not on file   Social History Narrative  . No narrative on file    No family history on file.  Past Medical History  Diagnosis Date  . Hypertension   . CHF (congestive heart failure)     Diastolic  . Arthritis   . Hyperlipemia   . Sleep apnea     He wore CPAP once  . Asthma   . Schizophrenia   . Hypothyroidism   . Bipolar 1 disorder   . Ejection fraction     EF 50-55%, March, 2012, technically difficult  study, some RV dilatation  . Venous insufficiency   . Lithium toxicity     April, 2012  . Edema     Secondary to diastolic CHF  . Obesity   . Statin intolerance     Muscle aches from statins in the past    Past Surgical History  Procedure Laterality Date  . Tonsillectomy      Patient Active Problem List  Diagnosis  . Hyperlipemia  . Sleep apnea  . Asthma  . Schizophrenia  . Hypothyroidism  . Bipolar 1 disorder  . Obesity  . Anemia, iron deficiency  . Hypotension  . Urinary retention with incomplete bladder emptying  . Nausea alone  . Leukocytosis, unspecified  . Dehydration with hyponatremia  . Acute kidney failure  . Hypercalcemia  . Hyperkalemia  . ATN (acute tubular necrosis)  . mild LVH (left ventricular hypertrophy) (ECHO 2013)  . Chronic pain syndrome  . Chronic constipation  . Ejection fraction  . Hypertension  . Arthritis  . Venous insufficiency  . Lithium toxicity  . Edema  . Statin intolerance  . Chronic diastolic CHF (congestive heart failure)    ROS   Patient denies fever, chills, headache, sweats, rash, change in vision, change in hearing, chest pain, cough, nausea or vomiting, urinary symptoms. All other systems are reviewed and are negative.  PHYSICAL EXAM  Patient is oriented to person time and place. He is markedly overweight. He is here with his wife. There is no jugulovenous distention. Lungs are clear. Respiratory effort is nonlabored. Cardiac exam reveals S1 and S2. There no clicks or significant murmurs. The abdomen is obese. Patient is wearing support hose. He does have edema below the support hose. There no musculoskeletal deformities. There are no skin rashes.  Filed Vitals:   09/09/12 1230  BP: 127/73  Pulse: 105  Height: 6\' 1"  (1.854 m)  Weight: 400 lb (181.439 kg)     ASSESSMENT & PLAN

## 2012-09-09 NOTE — Assessment & Plan Note (Signed)
The patient has significant chronic edema. He is wearing support hose. He is taking diuretics and tries to watch his salt intake. However he does have substantial fluid intake that is excessive for him. I had a long discussion with the patient and his wife about this. I've encouraged him to try to cut back on his total fluid intake. I've chosen not to change his diuretics. We've also talked about how to adjust the way he uses his support hose. He will take them off at night when he is in bed. He we'll then put him back on before he gets out of bed in the morning. We discussed this approach extensively. I explained to him why decreasing his fluid intake is necessary.

## 2012-09-09 NOTE — Assessment & Plan Note (Signed)
This caused a recent problem for him and led to hospitalization with severe renal dysfunction. All this improved. He is on Flomax and he is doing much better.

## 2012-09-09 NOTE — Assessment & Plan Note (Signed)
He had hypotension associated with his admission. He was volume depleted at that time. He is more stable now. His blood pressure is stable.

## 2012-09-09 NOTE — Patient Instructions (Addendum)
Your physician wants you to follow-up in: 3 - 4 months in the [place].   You will receive a reminder letter in the mail two months in advance. If you don't receive a letter, please call our office to schedule the follow-up appointment.  Try to cut back on the amount of fluid you drink  Make sure you put your support hose on before you get out of bed in the am

## 2012-09-09 NOTE — Assessment & Plan Note (Signed)
His blood pressure is reasonably controlled. His wife is very attentive and checks his pressure once or twice per day. We have decided that it is reasonable for him to use lisinopril only as needed. He does not needed for LV function as his systolic function is normal.  I spent greater than 25 minutes with his overall care. I spent more than half of the 25 minutes with direct contact with the patient and his wife talking about all the approaches to his fluid overload and blood pressure

## 2012-09-17 ENCOUNTER — Other Ambulatory Visit: Payer: Self-pay | Admitting: Hematology & Oncology

## 2012-09-17 DIAGNOSIS — J45909 Unspecified asthma, uncomplicated: Secondary | ICD-10-CM

## 2012-09-17 DIAGNOSIS — E291 Testicular hypofunction: Secondary | ICD-10-CM

## 2012-09-17 DIAGNOSIS — D509 Iron deficiency anemia, unspecified: Secondary | ICD-10-CM

## 2012-09-20 ENCOUNTER — Telehealth: Payer: Self-pay | Admitting: Hematology & Oncology

## 2012-09-20 NOTE — Telephone Encounter (Signed)
Per in basket called pt to schedule appointment this week. He refused saying he wanted to take a break this week he had a cath put in this weekend. He made 4-28 appointment. MD aware

## 2012-09-23 ENCOUNTER — Institutional Professional Consult (permissible substitution): Payer: Medicare Other | Admitting: Pulmonary Disease

## 2012-09-24 ENCOUNTER — Other Ambulatory Visit: Payer: Medicare Other | Admitting: Lab

## 2012-09-24 ENCOUNTER — Ambulatory Visit: Payer: Medicare Other | Admitting: Medical

## 2012-09-27 ENCOUNTER — Other Ambulatory Visit: Payer: Medicare Other | Admitting: Lab

## 2012-09-27 ENCOUNTER — Ambulatory Visit: Payer: Medicare Other | Admitting: Medical

## 2012-09-27 ENCOUNTER — Ambulatory Visit: Payer: Medicare Other

## 2012-09-27 ENCOUNTER — Telehealth: Payer: Self-pay | Admitting: Hematology & Oncology

## 2012-09-27 NOTE — Telephone Encounter (Signed)
Patient's caregiver Andreas Ohm called and cx patient's 09/27/12 apt and stated they will call back to resch

## 2012-10-22 ENCOUNTER — Telehealth: Payer: Self-pay | Admitting: Cardiology

## 2012-10-22 NOTE — Telephone Encounter (Signed)
New problem   [name] stated the manufactured will be sending over a fax for monitored scales for Dr to sign off on and fax back. [name] stated if you have any question you call call her.

## 2012-12-02 ENCOUNTER — Telehealth: Payer: Self-pay | Admitting: Hematology & Oncology

## 2012-12-02 NOTE — Telephone Encounter (Signed)
Patient called and resch 09/27/12 cx apt for 12/23/12

## 2012-12-22 ENCOUNTER — Telehealth: Payer: Self-pay | Admitting: Hematology & Oncology

## 2012-12-22 NOTE — Telephone Encounter (Signed)
Wife left message to cx 7-24 that pt feels fine. MD aware

## 2012-12-23 ENCOUNTER — Other Ambulatory Visit: Payer: Medicare Other | Admitting: Lab

## 2012-12-23 ENCOUNTER — Ambulatory Visit: Payer: Medicare Other

## 2012-12-23 ENCOUNTER — Ambulatory Visit: Payer: Medicare Other | Admitting: Hematology & Oncology

## 2013-01-05 ENCOUNTER — Telehealth: Payer: Self-pay | Admitting: Hematology & Oncology

## 2013-01-05 NOTE — Telephone Encounter (Signed)
Wife called to schedule pt answered when I called back was going to make appointment and then decided he would have wife call. I tried calling wife back but got [employer] answering machine

## 2013-01-11 ENCOUNTER — Emergency Department (HOSPITAL_COMMUNITY)
Admission: EM | Admit: 2013-01-11 | Discharge: 2013-01-11 | Disposition: A | Discharge status: Home / Self Care | Payer: Medicare Other | Attending: Emergency Medicine | Admitting: Emergency Medicine

## 2013-01-11 ENCOUNTER — Encounter (HOSPITAL_COMMUNITY): Payer: Self-pay

## 2013-01-11 DIAGNOSIS — I5030 Unspecified diastolic (congestive) heart failure: Secondary | ICD-10-CM | POA: Insufficient documentation

## 2013-01-11 DIAGNOSIS — Z8669 Personal history of other diseases of the nervous system and sense organs: Secondary | ICD-10-CM | POA: Insufficient documentation

## 2013-01-11 DIAGNOSIS — J45909 Unspecified asthma, uncomplicated: Secondary | ICD-10-CM | POA: Insufficient documentation

## 2013-01-11 DIAGNOSIS — M129 Arthropathy, unspecified: Secondary | ICD-10-CM | POA: Insufficient documentation

## 2013-01-11 DIAGNOSIS — I10 Essential (primary) hypertension: Secondary | ICD-10-CM | POA: Insufficient documentation

## 2013-01-11 DIAGNOSIS — Z8679 Personal history of other diseases of the circulatory system: Secondary | ICD-10-CM | POA: Insufficient documentation

## 2013-01-11 DIAGNOSIS — F319 Bipolar disorder, unspecified: Secondary | ICD-10-CM | POA: Insufficient documentation

## 2013-01-11 DIAGNOSIS — L8990 Pressure ulcer of unspecified site, unspecified stage: Secondary | ICD-10-CM

## 2013-01-11 DIAGNOSIS — Z79899 Other long term (current) drug therapy: Secondary | ICD-10-CM | POA: Insufficient documentation

## 2013-01-11 DIAGNOSIS — E669 Obesity, unspecified: Secondary | ICD-10-CM | POA: Insufficient documentation

## 2013-01-11 DIAGNOSIS — F17200 Nicotine dependence, unspecified, uncomplicated: Secondary | ICD-10-CM | POA: Insufficient documentation

## 2013-01-11 DIAGNOSIS — F209 Schizophrenia, unspecified: Secondary | ICD-10-CM | POA: Insufficient documentation

## 2013-01-11 DIAGNOSIS — L89309 Pressure ulcer of unspecified buttock, unspecified stage: Secondary | ICD-10-CM | POA: Insufficient documentation

## 2013-01-11 DIAGNOSIS — Z880 Allergy status to penicillin: Secondary | ICD-10-CM | POA: Insufficient documentation

## 2013-01-11 DIAGNOSIS — E785 Hyperlipidemia, unspecified: Secondary | ICD-10-CM | POA: Insufficient documentation

## 2013-01-11 DIAGNOSIS — E039 Hypothyroidism, unspecified: Secondary | ICD-10-CM | POA: Insufficient documentation

## 2013-01-11 HISTORY — DX: Bipolar disorder, unspecified: F31.9

## 2013-01-11 MED ORDER — DIMETHICONE 6 % EX CREA: 1.0000 [in_us] | TOPICAL_CREAM | Freq: Two times a day (BID) | CUTANEOUS | Status: DC

## 2013-01-11 NOTE — ED Provider Notes (Signed)
CSN: [id number]     Arrival date & time 01/11/13  1910 History  This chart was scribed for non-physician practitioner, Dierdre Forth, PA-C working with Dagmar Hait, MD by Greggory Stallion, ED scribe. This patient was seen in room TR09C/TR09C and the patient's care was started at 9:50 PM.   Chief Complaint  Patient presents with  . Open Wound   The history is provided by the patient. No language interpreter was used.    HPI Comments: [name] is a 47 y.o. male with h/o CHF who presents to the Emergency Department complaining of an open wound on her left buttocks with associated gradual onset, constant pain that has been there for one week. Pt rates the pain 8/10. He states there was a blister there initially but there are now 3 small open areas. He has been treating it with antibiotic ointment and peroxide with no improvement. There is no drainage or odor on the open areas. Pt denies trouble breathing, CP, nausea, emesis, fever, sweats and chills as associated symptoms. Pt states he has had similar ulcers on his feet secondary to swelling.  Past Medical History  Diagnosis Date  . Hypertension   . CHF (congestive heart failure)     Diastolic  . Arthritis   . Hyperlipemia   . Sleep apnea     He wore CPAP once  . Asthma   . Schizophrenia   . Hypothyroidism   . Bipolar 1 disorder   . Ejection fraction     EF 50-55%, March, 2012, technically difficult study, some RV dilatation  . Venous insufficiency   . Lithium toxicity     April, 2012  . Edema     Secondary to diastolic CHF  . Obesity   . Statin intolerance     Muscle aches from statins in the past  . Manic depression    Past Surgical History  Procedure Laterality Date  . Tonsillectomy     No family history on file. History  Substance Use Topics  . Smoking status: Current Every Day Smoker -- 1.50 packs/day for 20 years    Types: Cigarettes  . Smokeless tobacco: Never Used  . Alcohol Use: No     Review of Systems  Constitutional: Negative for fever, diaphoresis, appetite change, fatigue and unexpected weight change.  HENT: Negative for mouth sores and neck stiffness.   Eyes: Negative for visual disturbance.  Respiratory: Negative for cough, chest tightness, shortness of breath and wheezing.   Cardiovascular: Negative for chest pain.  Gastrointestinal: Negative for nausea, vomiting, abdominal pain, diarrhea and constipation.  Endocrine: Negative for polydipsia, polyphagia and polyuria.  Genitourinary: Negative for dysuria, urgency, frequency and hematuria.  Musculoskeletal: Negative for myalgias and back pain.  Skin: Positive for wound. Negative for rash.  Allergic/Immunologic: Negative for immunocompromised state.  Neurological: Negative for syncope, light-headedness and headaches.  Hematological: Does not bruise/bleed easily.  Psychiatric/Behavioral: Negative for sleep disturbance. The patient is not nervous/anxious.   All other systems reviewed and are negative.    Allergies  Aspirin; Statins; and Penicillins  Home Medications   Current Outpatient Rx  Name  Route  Sig  Dispense  Refill  . acetaminophen (TYLENOL) 500 MG tablet   Oral   Take 1,000 mg by mouth every 6 (six) hours as needed for pain.         Marland Kitchen albuterol (PROVENTIL HFA;VENTOLIN HFA) 108 (90 BASE) MCG/ACT inhaler   Inhalation   Inhale 2 puffs into the lungs every 4 (four)  hours as needed for wheezing.         Marland Kitchen alprazolam (XANAX) 2 MG tablet   Oral   Take 2 mg by mouth 4 (four) times daily as needed for anxiety.          . diazepam (VALIUM) 10 MG tablet   Oral   Take 10 mg by mouth every 12 (twelve) hours as needed for anxiety.          . dicyclomine (BENTYL) 20 MG tablet   Oral   Take 1 tablet by mouth 4 times daily.         Marland Kitchen esomeprazole (NEXIUM) 40 MG capsule   Oral   Take 40 mg by mouth at bedtime.          . fenofibrate 160 MG tablet   Oral   Take 160 mg by mouth  daily.         . furosemide (LASIX) 80 MG tablet   Oral   Take 40 mg by mouth 2 (two) times daily.          Marland Kitchen HYDROcodone-acetaminophen (NORCO) 10-325 MG per tablet   Oral   Take 1 tablet by mouth every 6 (six) hours as needed for pain. For pain         . lactulose (CHRONULAC) 10 GM/15ML solution   Oral   Take 30 mLs by mouth 3 (three) times daily as needed (for constipation).          Marland Kitchen levothyroxine (SYNTHROID, LEVOTHROID) 100 MCG tablet   Oral   Take 100 mcg by mouth every morning.          Marland Kitchen lisinopril (PRINIVIL,ZESTRIL) 10 MG tablet   Oral   Take 10 mg by mouth daily. 1/2 tablet as needed         . metoCLOPramide (REGLAN) 10 MG tablet   Oral   Take 10 mg by mouth 4 (four) times daily as needed (for stomach pain).          Marland Kitchen morphine (MS CONTIN) 60 MG 12 hr tablet   Oral   Take 60 mg by mouth every 12 (twelve) hours. As needed for pain         . ondansetron (ZOFRAN) 4 MG tablet   Oral   Take 4 mg by mouth every 8 (eight) hours as needed for nausea.          Marland Kitchen spironolactone (ALDACTONE) 50 MG tablet   Oral   Take 50 mg by mouth daily.          Marland Kitchen tiZANidine (ZANAFLEX) 4 MG tablet   Oral   Take 4-8 mg by mouth every 8 (eight) hours as needed (for muscle spasm).          Marland Kitchen venlafaxine (EFFEXOR) 75 MG tablet   Oral   Take 75 mg by mouth 2 (two) times daily.          . Dimethicone 6 % CREA   Apply externally   Apply 1-2 inches topically 2 (two) times daily.   270 g   0   . metolazone (ZAROXOLYN) 2.5 MG tablet   Oral   Take 2.5 mg by mouth daily.         . polyethylene glycol powder (GLYCOLAX/MIRALAX) powder   Oral   Take 17 g by mouth daily as needed (for constipation).           BP 147/66  Pulse 104  Temp(Src) 98.1 F (36.7 C) (Oral)  Resp 16  Ht 6\' 1"  (1.854 m)  Wt 410 lb 2 oz (186.031 kg)  BMI 54.12 kg/m2  SpO2 96%  Physical Exam  Nursing note and vitals reviewed. Constitutional: He appears well-developed and  well-nourished. No distress.  Awake, alert, nontoxic appearance  HENT:  Head: Normocephalic and atraumatic.  Mouth/Throat: Oropharynx is clear and moist. No oropharyngeal exudate.  Eyes: Conjunctivae are normal. No scleral icterus.  Neck: Normal range of motion. Neck supple.  Cardiovascular: Normal rate, regular rhythm, normal heart sounds and intact distal pulses.   No murmur heard. Pulmonary/Chest: Effort normal and breath sounds normal. No respiratory distress. He has no wheezes. He has no rales.  Abdominal: Soft. Bowel sounds are normal. He exhibits no distension. There is no tenderness.  obese  Musculoskeletal: Normal range of motion. He exhibits no edema.  Neurological: He is alert.  Speech is clear and goal oriented Moves extremities without ataxia  Skin: Skin is warm and dry. He is not diaphoretic. There is erythema.  3cm x 24cm area of skin breakdown with 2, 2 x 2 centimeter areas of stage III ulceration located on the left inner thigh; similar area of skin breakdown noted on the right inner thigh without ulceration No induration or abscess formation Mild pain to palpation of the area  Psychiatric: He has a normal mood and affect.    ED Course   Procedures (including critical care time)  DIAGNOSTIC STUDIES: Oxygen Saturation is 96% on RA, normal by my interpretation.    COORDINATION OF CARE: 10:07 PM-Discussed treatment plan which includes speaking with Dr. Gwendolyn Grant and antibiotics with pt at bedside and pt agreed to plan.   Labs Reviewed - No data to display No results found. 1. Pressure ulcer, unspecified pressure ulcer stage   2. Skin breakdown     MDM  Pablo Lawrence presents with skin breakdown of bilateral thighs with left being worse than right. Patient is afebrile, nontoxic, nonseptic appearing. No evidence of cellulitis or abscess. Recommend use of a barrier cream including dimethicone, increased ambulation with decreased sedentary time as well as close  followup with primary care and wound care.  I have also discussed reasons to return immediately to the ER.  Patient expresses understanding and agrees with plan.  Dr. Gwendolyn Grant was consulted, evaluated this patient with me and agrees with the plan.    I personally performed the services described in this documentation, which was scribed in my presence. The recorded information has been reviewed and is accurate.   Dahlia Client Nitisha Civello, PA-C 01/11/13 2258

## 2013-01-11 NOTE — ED Notes (Signed)
Patient presents with c/o a wound to the left buttocks with 3 small open areas x 1 week. Has been treating it with antibiotic ointment and peroxide. Has not worsened but is not improved. Open areas have no drainage or odor. No fevers, sweats or chills. Area is painful, 8/10. Prescribed pain medications not helpful with the pain.

## 2013-01-12 NOTE — ED Provider Notes (Signed)
Medical screening examination/treatment/procedure(s) were conducted as a shared visit with non-physician practitioner(s) and myself.  I personally evaluated the patient during the encounter  Obese gentleman with skin breakdown on inner thigh bilaterally. No signs of infection. Instructed to use barrier creams and f/u with wound center.   Dagmar Hait, MD 01/12/13 1048

## 2013-02-07 ENCOUNTER — Ambulatory Visit: Payer: Medicare Other | Admitting: Cardiology

## 2013-02-11 IMAGING — US US EXTREM LOW VENOUS BILAT
1 series · 13 of 24 positions shown · non-contrast
Comparison: None.

CLINICAL DATA: Bilateral lower extremity edema.

VENOUS DUPLEX ULTRASOUND OF BILATERAL LOWER EXTREMITIES
TECHNIQUE: Gray-scale sonography with graded compression, as well
as color Doppler and duplex ultrasound, were performed to evaluate
the deep venous system of both lower extremities from the level of
the common femoral vein through the popliteal and proximal calf
veins. Spectral Doppler was utilized to evaulate flow at rest and
with distal augmentation maneuvers.

[Series 1: us extrem low venous bilat · 13 of 50 slices shown]
[im 1/50]
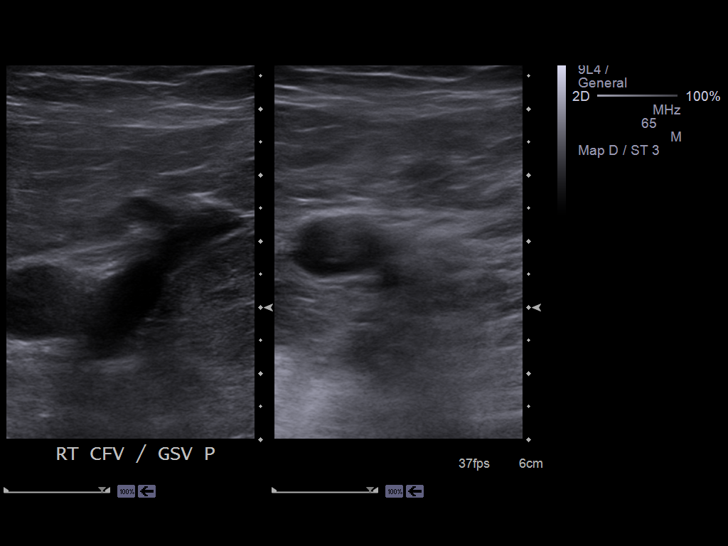
[im 5/50]
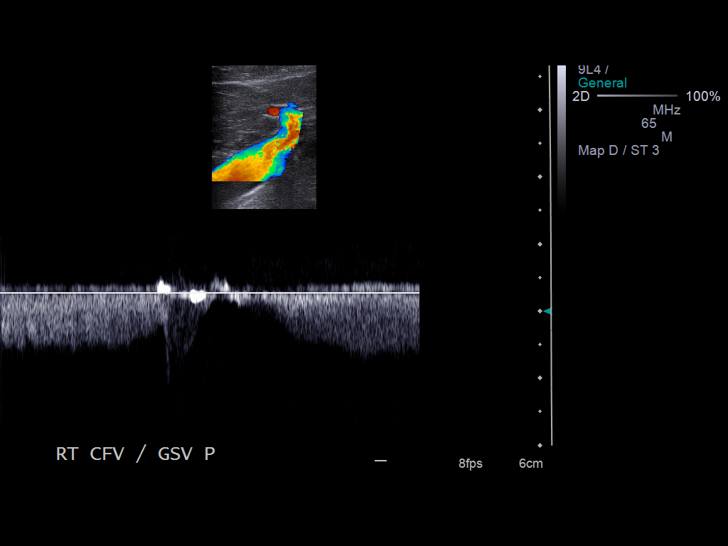
[im 9/50]
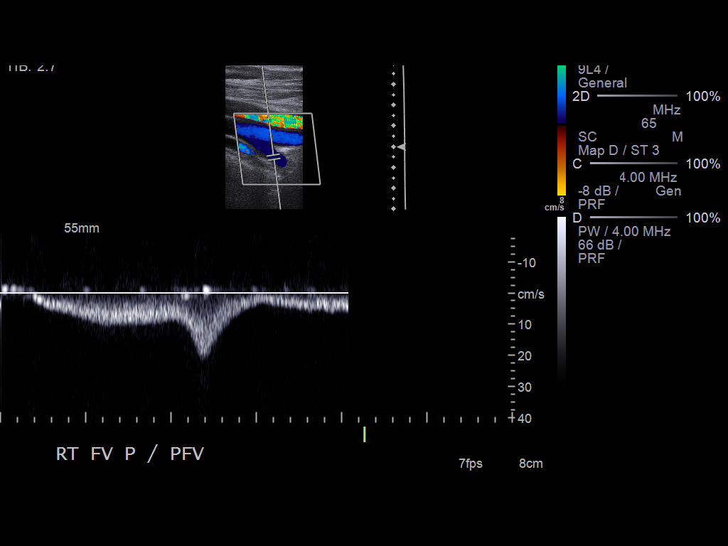
[im 13/50]
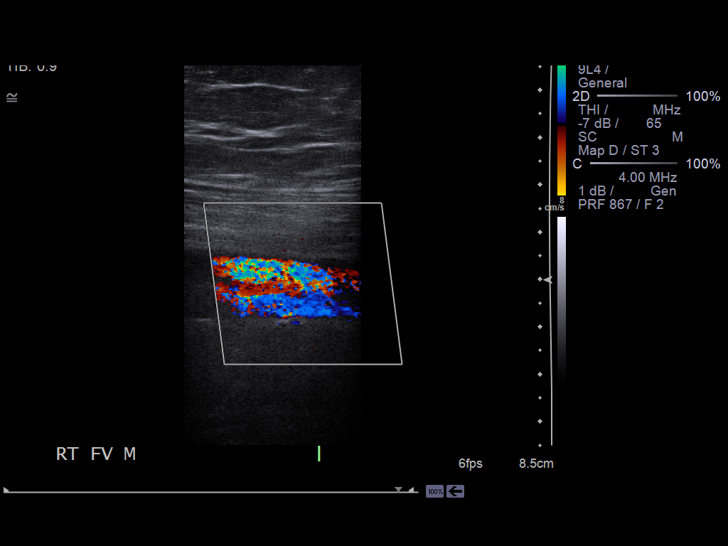
[im 18/50]
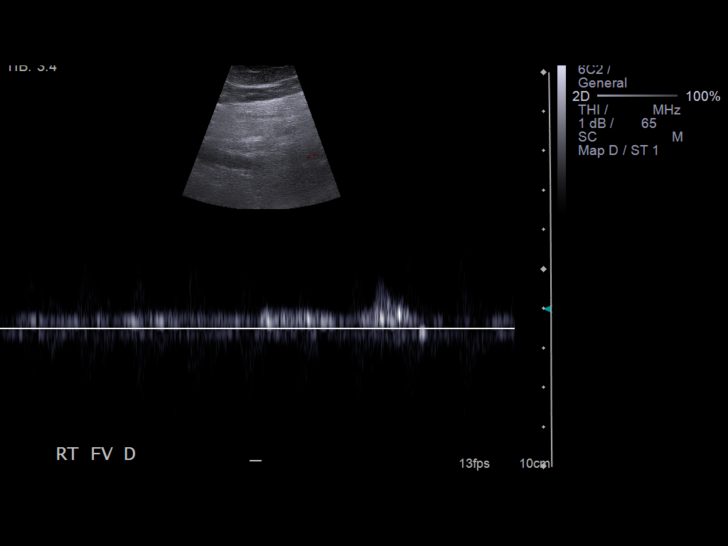
[im 22/50]
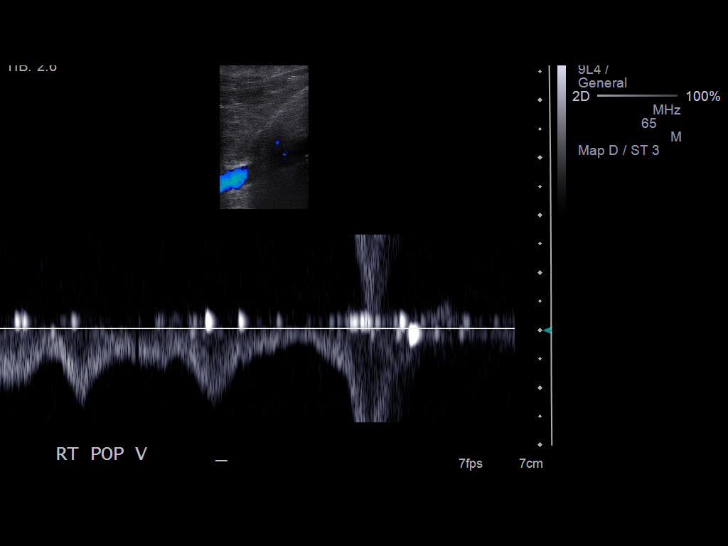
[im 26/50]
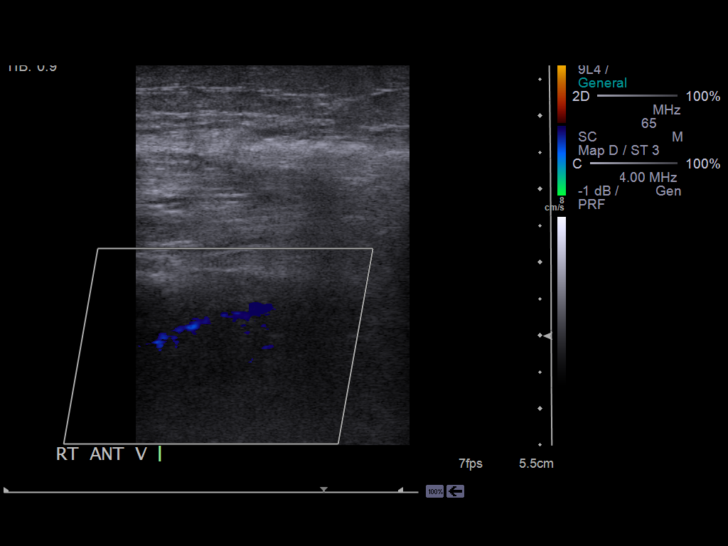
[im 28/50]
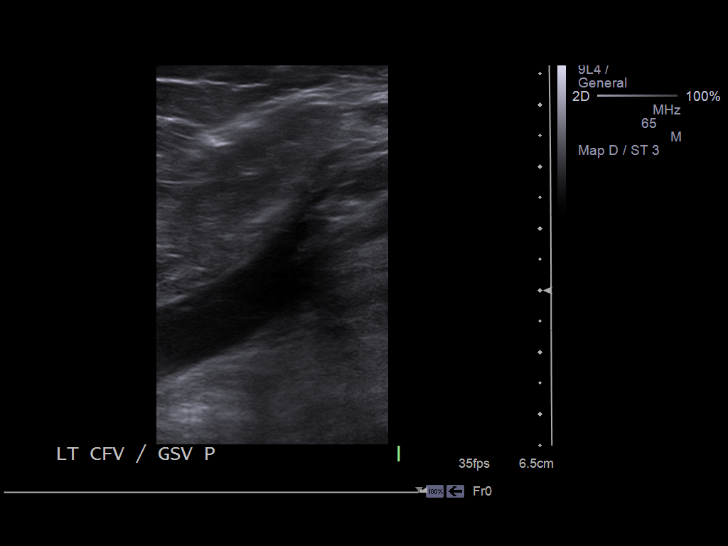
[im 32/50]
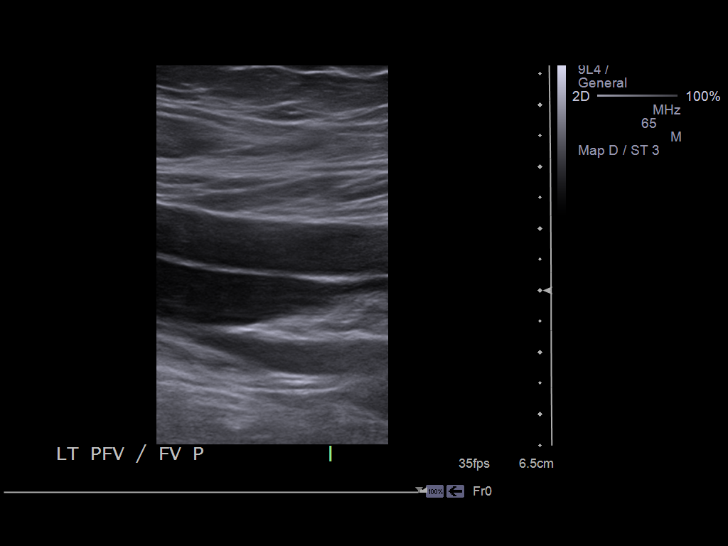
[im 37/50]
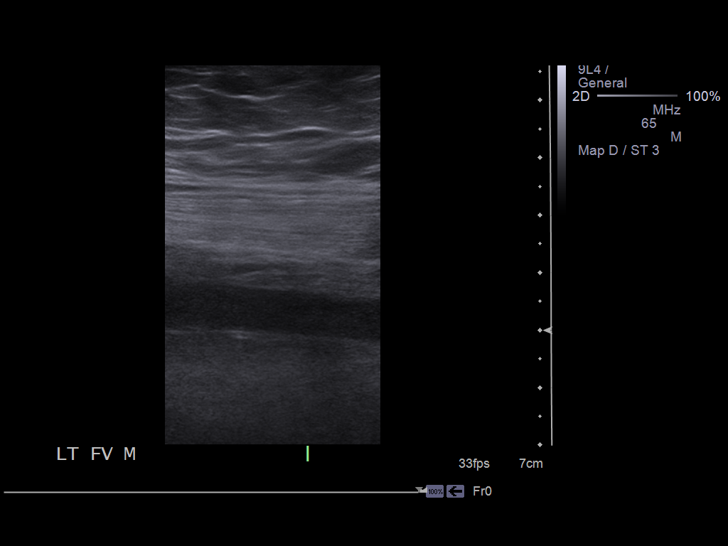
[im 41/50]
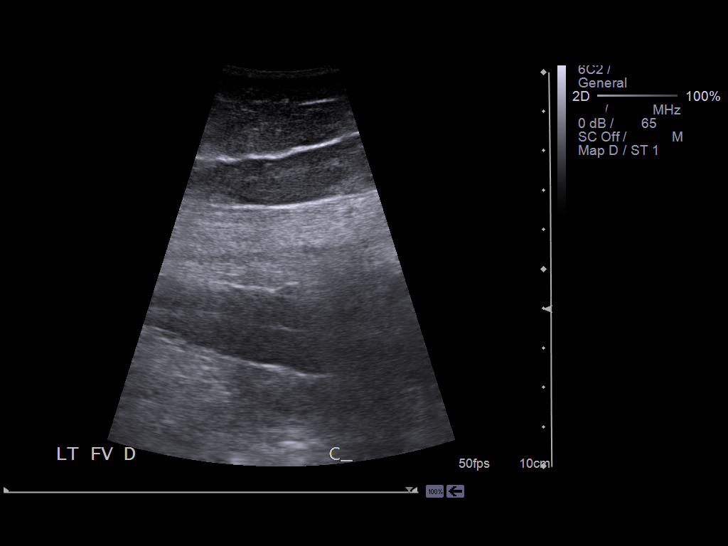
[im 45/50]
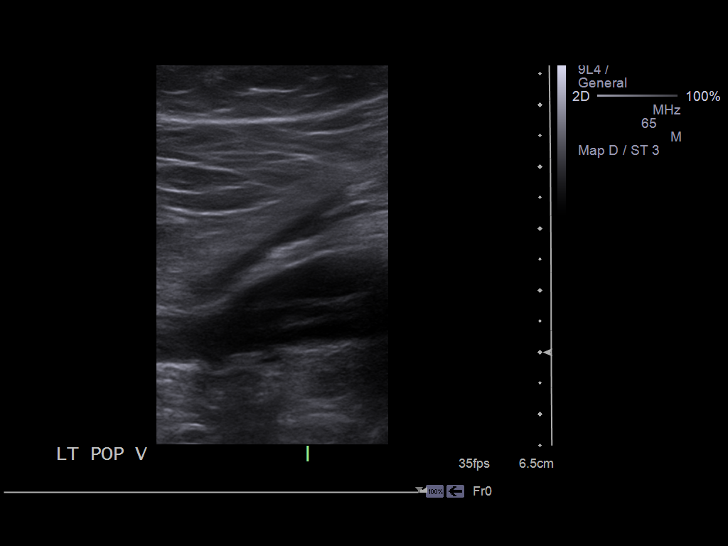
[im 50/50]
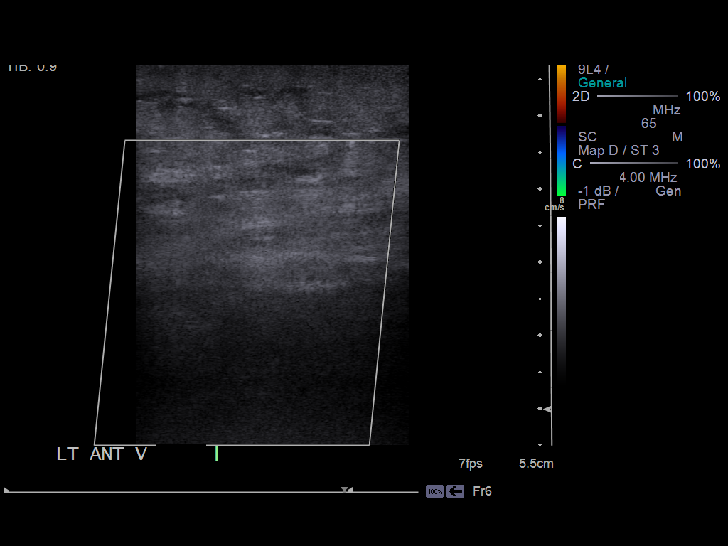

[13 of 24 positions shown; findings below may reference images not displayed]

FINDINGS: The right common femoral vein, deep femoral vein,
superficial femoral vein, saphenofemoral junction, and popliteal
vein show normal patent directional flow, normal phasicity, normal
augmentation, normal compression.  Visualization of the superficial
femoral vein in the distal thigh is degraded by the patient's body
habitus.

The left common femoral vein, superficial femoral vein, deep
femoral vein, saphenofemoral junction and popliteal vein also show
normal patent directional flow, normal phasicity, normal location,
normal compression.  As on the contralateral side, the distal
superficial femoral vein is difficult to clearly visualize
secondary to body habitus.
IMPRESSION: No evidence for DVT within either lower extremity.

## 2013-02-22 ENCOUNTER — Ambulatory Visit: Payer: Medicare Other | Admitting: Cardiology

## 2013-04-29 ENCOUNTER — Ambulatory Visit: Payer: Medicare Other | Admitting: Hematology & Oncology

## 2013-04-29 ENCOUNTER — Telehealth: Payer: Self-pay | Admitting: Hematology & Oncology

## 2013-04-29 ENCOUNTER — Other Ambulatory Visit: Payer: Medicare Other | Admitting: Lab

## 2013-04-29 NOTE — Telephone Encounter (Signed)
Patient missed 04/29/13 apt.  i called and left message for patient to call back to resch missed apt

## 2013-07-15 ENCOUNTER — Ambulatory Visit: Payer: Medicare Other | Admitting: Cardiology

## 2013-08-02 ENCOUNTER — Ambulatory Visit (INDEPENDENT_AMBULATORY_CARE_PROVIDER_SITE_OTHER): Payer: Medicare Other | Admitting: Cardiology

## 2013-08-02 ENCOUNTER — Encounter: Payer: Self-pay | Admitting: Cardiology

## 2013-08-02 ENCOUNTER — Encounter (INDEPENDENT_AMBULATORY_CARE_PROVIDER_SITE_OTHER): Payer: Self-pay

## 2013-08-02 VITALS — BP 116/78 | HR 90 | Ht 73.0 in | Wt >= 6400 oz

## 2013-08-02 DIAGNOSIS — I5032 Chronic diastolic (congestive) heart failure: Secondary | ICD-10-CM

## 2013-08-02 DIAGNOSIS — I509 Heart failure, unspecified: Secondary | ICD-10-CM

## 2013-08-02 DIAGNOSIS — I10 Essential (primary) hypertension: Secondary | ICD-10-CM

## 2013-08-02 DIAGNOSIS — R0602 Shortness of breath: Secondary | ICD-10-CM

## 2013-08-02 NOTE — Assessment & Plan Note (Signed)
The patient has chronic diastolic CHF. His volume is controlled when he takes his diuretics and pays attention to limiting his salt and fluid intake. He has chronic edema but this is stable. No further workup is needed.

## 2013-08-02 NOTE — Assessment & Plan Note (Signed)
Blood pressure is controlled. No change in therapy. 

## 2013-08-02 NOTE — Patient Instructions (Signed)

## 2013-08-02 NOTE — Progress Notes (Signed)
HPI  The patient is seen for cardiology followup. I saw him last April, 2014. He has a multitude of medical problems. He has known diastolic CHF. He is on diuretics. He has been trying to limit his salt and overall fluid intake. He has chronic edema. However he has not had any marked changes over time. He is here with his wife.  Allergies  Allergen Reactions  . Aspirin Other (See Comments)    Causes asthmatic symptoms  . Statins     Joint pain   . Penicillins Rash    Current Outpatient Prescriptions  Medication Sig Dispense Refill  . acetaminophen (TYLENOL) 500 MG tablet Take 1,000 mg by mouth every 6 (six) hours as needed for pain.      [name] albuterol (PROVENTIL HFA;VENTOLIN HFA) 108 (90 BASE) MCG/ACT inhaler Inhale 2 puffs into the lungs every 4 (four) hours as needed for wheezing.      [name] alprazolam (XANAX) 2 MG tablet Take 2 mg by mouth 4 (four) times daily as needed for anxiety.       . ARIPiprazole (ABILIFY) 5 MG tablet Take 5 mg by mouth at bedtime.      . diazepam (VALIUM) 10 MG tablet Take 10 mg by mouth every 12 (twelve) hours as needed for anxiety.       . dicyclomine (BENTYL) 20 MG tablet Take 1 tablet by mouth 4 times daily.      [name] esomeprazole (NEXIUM) 40 MG capsule Take 40 mg by mouth at bedtime.       . fenofibrate 160 MG tablet Take 160 mg by mouth daily.      . furosemide (LASIX) 80 MG tablet Take 40 mg by mouth 2 (two) times daily.       [name] gabapentin (NEURONTIN) 400 MG capsule Take 400 mg by mouth 3 (three) times daily.      [name] HYDROcodone-acetaminophen (NORCO) 10-325 MG per tablet Take 1 tablet by mouth every 6 (six) hours as needed for pain. For pain      . imipramine (TOFRANIL) 50 MG tablet Take 150 mg by mouth 2 (two) times daily.      [name] lactulose (CHRONULAC) 10 GM/15ML solution Take 30 mLs by mouth 3 (three) times daily as needed (for constipation).       [name] levothyroxine (SYNTHROID, LEVOTHROID) 100 MCG tablet Take 100 mcg by mouth every morning.       [name]  lisinopril (PRINIVIL,ZESTRIL) 10 MG tablet Take 10 mg by mouth daily. 1/2 tablet as needed      . metoCLOPramide (REGLAN) 10 MG tablet Take 10 mg by mouth 4 (four) times daily as needed (for stomach pain).       [name] morphine (MS CONTIN) 60 MG 12 hr tablet Take 60 mg by mouth every 12 (twelve) hours. As needed for pain      . ondansetron (ZOFRAN) 4 MG tablet Take 4 mg by mouth every 8 (eight) hours as needed for nausea.       . polyethylene glycol powder (GLYCOLAX/MIRALAX) powder Take 17 g by mouth daily as needed (for constipation).       [name] spironolactone (ALDACTONE) 50 MG tablet Take 50 mg by mouth daily.       [name] tiZANidine (ZANAFLEX) 4 MG tablet Take 4-8 mg by mouth every 8 (eight) hours as needed (for muscle spasm).       [name] venlafaxine (EFFEXOR) 75 MG tablet Take 75 mg by mouth 2 (two) times daily.  No current facility-administered medications for this visit.    History   Social History  . Marital Status: Married    Spouse Name: N/A    Number of Children: N/A  . Years of Education: N/A   Occupational History  . Not on file.   Social History Main Topics  . Smoking status: Current Every Day Smoker -- 3.00 packs/day for 20 years    Types: Cigarettes  . Smokeless tobacco: Never Used  . Alcohol Use: No  . Drug Use: No  . Sexual Activity: No   Other Topics Concern  . Not on file   Social History Narrative  . No narrative on file    No family history on file.  Past Medical History  Diagnosis Date  . Hypertension   . CHF (congestive heart failure)     Diastolic  . Arthritis   . Hyperlipemia   . Sleep apnea     He wore CPAP once  . Asthma   . Schizophrenia   . Hypothyroidism   . Bipolar 1 disorder   . Ejection fraction     EF 50-55%, March, 2012, technically difficult study, some RV dilatation  . Venous insufficiency   . Lithium toxicity     April, 2012  . Edema     Secondary to diastolic CHF  . Obesity   . Statin intolerance     Muscle aches from statins  in the past  . Manic depression     Past Surgical History  Procedure Laterality Date  . Tonsillectomy      Patient Active Problem List   Diagnosis Date Noted  . Chronic diastolic CHF (congestive heart failure) 09/09/2012  . Ejection fraction   . Hypertension   . Arthritis   . Venous insufficiency   . Lithium toxicity   . Edema   . Statin intolerance   . ATN (acute tubular necrosis) 08/03/2012  . mild LVH (left ventricular hypertrophy) (ECHO 2013) 08/03/2012  . Chronic pain syndrome 08/03/2012  . Chronic constipation 08/03/2012  . Hypotension 08/02/2012  . Urinary retention with incomplete bladder emptying 08/02/2012  . Nausea alone 08/02/2012  . Leukocytosis, unspecified 08/02/2012  . Dehydration with hyponatremia 08/02/2012  . Acute kidney failure 08/02/2012  . Hypercalcemia 08/02/2012  . Hyperkalemia 08/02/2012  . Anemia, iron deficiency 03/11/2012  . Obesity   . Hyperlipemia   . Sleep apnea   . Asthma   . Schizophrenia   . Hypothyroidism   . Bipolar 1 disorder     ROS   Patient denies fever, chills, headache, sweats, rash, change in vision, change in hearing, chest pain, cough, nausea vomiting, urinary symptoms. All other systems are reviewed and are negative.  PHYSICAL EXAM   The patient is markedly overweight. He is in a wheelchair. He is oriented to person time and place. Affect is normal. There is no jugulovenous distention. Lungs are clear with decreased breath sounds. There is no respiratory distress. Cardiac exam reveals S1 and S2. The abdomen is protuberant but soft. The patient has chronic brawny edema in his lower extremities. This is actually stable for him.  Filed Vitals:   08/02/13 0845  BP: 116/78  Pulse: 90  Height: 6\' 1"  (1.854 m)  Weight: 446 lb 12.8 oz (202.667 kg)  SpO2: 92%   EKG is done today and reviewed by me. There is sinus rhythm. There is no significant change.  ASSESSMENT & PLAN

## 2013-08-03 ENCOUNTER — Telehealth: Payer: Self-pay | Admitting: Hematology & Oncology

## 2013-08-03 NOTE — Telephone Encounter (Signed)
Wife called said pt not feeling well. He was no show in November and cx July 2014 appointments. I left RN message to triage pt we don't have anything open until april

## 2013-08-29 ENCOUNTER — Telehealth: Payer: Self-pay | Admitting: Hematology & Oncology

## 2013-08-29 NOTE — Telephone Encounter (Signed)
Wife called I transferred to RN

## 2013-08-29 NOTE — Telephone Encounter (Signed)
Pt aware of 4-13 appointment

## 2013-09-12 ENCOUNTER — Ambulatory Visit (HOSPITAL_BASED_OUTPATIENT_CLINIC_OR_DEPARTMENT_OTHER): Payer: Medicare Other

## 2013-09-12 ENCOUNTER — Ambulatory Visit (HOSPITAL_BASED_OUTPATIENT_CLINIC_OR_DEPARTMENT_OTHER): Payer: Medicare Other | Admitting: Hematology & Oncology

## 2013-09-12 ENCOUNTER — Encounter: Payer: Self-pay | Admitting: Hematology & Oncology

## 2013-09-12 ENCOUNTER — Other Ambulatory Visit (HOSPITAL_BASED_OUTPATIENT_CLINIC_OR_DEPARTMENT_OTHER): Payer: Medicare Other | Admitting: Lab

## 2013-09-12 ENCOUNTER — Ambulatory Visit (HOSPITAL_BASED_OUTPATIENT_CLINIC_OR_DEPARTMENT_OTHER)
Admission: RE | Admit: 2013-09-12 | Discharge: 2013-09-12 | Disposition: A | Discharge status: Home / Self Care | Payer: Medicare Other | Source: Ambulatory Visit | Attending: Hematology & Oncology | Admitting: Hematology & Oncology

## 2013-09-12 VITALS — BP 123/59 | HR 91 | Temp 98.0°F | Resp 20 | Ht 70.0 in | Wt >= 6400 oz

## 2013-09-12 DIAGNOSIS — E039 Hypothyroidism, unspecified: Secondary | ICD-10-CM

## 2013-09-12 DIAGNOSIS — I10 Essential (primary) hypertension: Secondary | ICD-10-CM | POA: Insufficient documentation

## 2013-09-12 DIAGNOSIS — E291 Testicular hypofunction: Secondary | ICD-10-CM

## 2013-09-12 DIAGNOSIS — E349 Endocrine disorder, unspecified: Secondary | ICD-10-CM

## 2013-09-12 DIAGNOSIS — R059 Cough, unspecified: Secondary | ICD-10-CM | POA: Insufficient documentation

## 2013-09-12 DIAGNOSIS — J45909 Unspecified asthma, uncomplicated: Secondary | ICD-10-CM

## 2013-09-12 DIAGNOSIS — D509 Iron deficiency anemia, unspecified: Secondary | ICD-10-CM

## 2013-09-12 DIAGNOSIS — R05 Cough: Secondary | ICD-10-CM

## 2013-09-12 DIAGNOSIS — R079 Chest pain, unspecified: Secondary | ICD-10-CM | POA: Insufficient documentation

## 2013-09-12 DIAGNOSIS — I517 Cardiomegaly: Secondary | ICD-10-CM | POA: Insufficient documentation

## 2013-09-12 DIAGNOSIS — J811 Chronic pulmonary edema: Secondary | ICD-10-CM | POA: Insufficient documentation

## 2013-09-12 DIAGNOSIS — I509 Heart failure, unspecified: Secondary | ICD-10-CM | POA: Insufficient documentation

## 2013-09-12 LAB — CMP (CANCER CENTER ONLY)
ALBUMIN: 4 g/dL (ref 3.3–5.5)
ALT(SGPT): 57 U/L — ABNORMAL HIGH (ref 10–47)
AST: 47 U/L — ABNORMAL HIGH (ref 11–38)
Alkaline Phosphatase: 52 U/L (ref 26–84)
BUN: 10 mg/dL (ref 7–22)
CALCIUM: 9.7 mg/dL (ref 8.0–10.3)
CHLORIDE: 99 meq/L (ref 98–108)
CO2: 29 meq/L (ref 18–33)
Creat: 0.8 mg/dl (ref 0.6–1.2)
GLUCOSE: 107 mg/dL (ref 73–118)
POTASSIUM: 3.9 meq/L (ref 3.3–4.7)
Sodium: 141 mEq/L (ref 128–145)
Total Bilirubin: 0.4 mg/dl (ref 0.20–1.60)
Total Protein: 8.3 g/dL — ABNORMAL HIGH (ref 6.4–8.1)

## 2013-09-12 LAB — TESTOSTERONE: TESTOSTERONE: 21 ng/dL — AB (ref 300–890)

## 2013-09-12 LAB — CBC WITH DIFFERENTIAL (CANCER CENTER ONLY)
BASO#: 0.1 10*3/uL (ref 0.0–0.2)
BASO%: 0.7 % (ref 0.0–2.0)
EOS ABS: 0.3 10*3/uL (ref 0.0–0.5)
EOS%: 3.1 % (ref 0.0–7.0)
HCT: 42.5 % (ref 38.7–49.9)
HGB: 13.4 g/dL (ref 13.0–17.1)
LYMPH#: 1.9 10*3/uL (ref 0.9–3.3)
LYMPH%: 18.3 % (ref 14.0–48.0)
MCH: 26.4 pg — ABNORMAL LOW (ref 28.0–33.4)
MCHC: 31.5 g/dL — ABNORMAL LOW (ref 32.0–35.9)
MCV: 84 fL (ref 82–98)
MONO#: 0.9 10*3/uL (ref 0.1–0.9)
MONO%: 8.9 % (ref 0.0–13.0)
NEUT%: 69 % (ref 40.0–80.0)
NEUTROS ABS: 7.1 10*3/uL — AB (ref 1.5–6.5)
PLATELETS: 392 10*3/uL (ref 145–400)
RBC: 5.08 10*6/uL (ref 4.20–5.70)
RDW: 16.6 % — ABNORMAL HIGH (ref 11.1–15.7)
WBC: 10.2 10*3/uL — AB (ref 4.0–10.0)

## 2013-09-12 LAB — TECHNOLOGIST REVIEW CHCC SATELLITE

## 2013-09-12 LAB — IRON AND TIBC CHCC
%SAT: 11 % — ABNORMAL LOW (ref 20–55)
Iron: 53 ug/dL (ref 42–163)
TIBC: 478 ug/dL — ABNORMAL HIGH (ref 202–409)
UIBC: 424 ug/dL — ABNORMAL HIGH (ref 117–376)

## 2013-09-12 LAB — CHCC SATELLITE - SMEAR

## 2013-09-12 LAB — TSH CHCC: TSH: 1.23 m[IU]/L (ref 0.320–4.118)

## 2013-09-12 LAB — RETICULOCYTES (CHCC)
ABS RETIC: 65.9 10*3/uL (ref 19.0–186.0)
RBC.: 5.07 MIL/uL (ref 4.22–5.81)
RETIC CT PCT: 1.3 % (ref 0.4–2.3)

## 2013-09-12 LAB — FERRITIN CHCC: Ferritin: 101 ng/ml (ref 22–316)

## 2013-09-12 MED ORDER — TESTOSTERONE CYPIONATE 200 MG/ML IM SOLN
400.0000 mg | INTRAMUSCULAR | Status: DC
  Administered 2013-09-12: 400 mg via INTRAMUSCULAR

## 2013-09-12 MED ORDER — TESTOSTERONE CYPIONATE 200 MG/ML IM SOLN
INTRAMUSCULAR | Status: AC
  Filled 2013-09-12: qty 2

## 2013-09-12 NOTE — Patient Instructions (Signed)
Testosterone This test is used to determine if your testosterone level is abnormal. This could be used to explain difficulty getting an erection (erectile dysfunction), inability of your partner to get pregnant (infertility), premature or delayed puberty if you are male, or the appearance of masculine physical features if you are male. PREPARATION FOR TEST A blood sample is obtained by inserting a needle into a vein in the arm. NORMAL FINDINGS  Free Testosterone: 0.3-2 pg/mL  % Free Testosterone: 0.1%-0.3% Total Testosterone:  7 mos-9 yrs (Tanner Stage I)  Male: Less than 30 ng/dL  Male: Less than 30 ng/dL  10-13 yrs (Tanner Stage II)  Male: Less than 300 ng/dL  Male: Less than 40 ng/dL  14-15 yrs (Tanner Stage III)  Male: 170-540 ng/dL  Male: Less than 60 ng/dL  16-19 yrs (Tanner Stage IV, V)  Male: 250-910 ng/dL  Male: Less than 70 ng/dL  20 yrs and over  Male: 280-1080 ng/dL  Male: Less than 70 ng/dL Ranges for normal findings may vary among different laboratories and hospitals. You should always check with your doctor after having lab work or other tests done to discuss the meaning of your test results and whether your values are considered within normal limits. MEANING OF TEST  Your caregiver will go over the test results with you and discuss the importance and meaning of your results, as well as treatment options and the need for additional tests if necessary. OBTAINING THE TEST RESULTS It is your responsibility to obtain your test results. Ask the lab or department performing the test when and how you will get your results. Document Released: 06/05/2004 Document Revised: 08/11/2011 Document Reviewed: 04/30/2008 [employer] Patient Information 2014 [employer].  

## 2013-09-12 NOTE — Patient Instructions (Signed)
You Can Quit Smoking If you are ready to quit smoking or are thinking about it, congratulations! You have chosen to help yourself be healthier and live longer! There are lots of different ways to quit smoking. Nicotine gum, nicotine patches, a nicotine inhaler, or nicotine nasal spray can help with physical craving. Hypnosis, support groups, and medicines help break the habit of smoking. TIPS TO GET OFF AND STAY OFF CIGARETTES  Learn to predict your moods. Do not let a bad situation be your excuse to have a cigarette. Some situations in your life might tempt you to have a cigarette.  Ask friends and co-workers not to smoke around you.  Make your home smoke-free.  Never have "just one" cigarette. It leads to wanting another and another. Remind yourself of your decision to quit.  On a card, make a list of your reasons for not smoking. Read it at least the same number of times a day as you have a cigarette. Tell yourself everyday, "I do not want to smoke. I choose not to smoke."  Ask someone at home or work to help you with your plan to quit smoking.  Have something planned after you eat or have a cup of coffee. Take a walk or get other exercise to perk you up. This will help to keep you from overeating.  Try a relaxation exercise to calm you down and decrease your stress. Remember, you may be tense and nervous the first two weeks after you quit. This will pass.  Find new activities to keep your hands busy. Play with a pen, coin, or rubber band. Doodle or draw things on paper.  Brush your teeth right after eating. This will help cut down the craving for the taste of tobacco after meals. You can try mouthwash too.  Try gum, breath mints, or diet candy to keep something in your mouth. IF YOU SMOKE AND WANT TO QUIT:  Do not stock up on cigarettes. Never buy a carton. Wait until one pack is finished before you buy another.  Never carry cigarettes with you at work or at home.  Keep cigarettes  as far away from you as possible. Leave them with someone else.  Never carry matches or a lighter with you.  Ask yourself, "Do I need this cigarette or is this just a reflex?"  Bet with someone that you can quit. Put cigarette money in a piggy bank every morning. If you smoke, you give up the money. If you do not smoke, by the end of the week, you keep the money.  Keep trying. It takes 21 days to change a habit!  Talk to your doctor about using medicines to help you quit. These include nicotine replacement gum, lozenges, or skin patches. Document Released: 03/15/2009 Document Revised: 08/11/2011 Document Reviewed: 03/15/2009 [employer] Patient Information 2014 [employer].  

## 2013-09-13 ENCOUNTER — Telehealth: Payer: Self-pay | Admitting: Hematology & Oncology

## 2013-09-13 ENCOUNTER — Encounter: Payer: Self-pay | Admitting: Nurse Practitioner

## 2013-09-13 NOTE — Telephone Encounter (Signed)
Pt aware of 4-17 iron infusion

## 2013-09-13 NOTE — Progress Notes (Signed)
Hematology and Oncology Follow Up Visit  [name] [id number] 02-03-1966 48 y.o. 09/13/2013   Principle Diagnosis:  1. Anemia, iron deficiency. 2. Hypotestosteronemia. 3. Diastolic dysfunction.  Current Therapy:    Observation     Interim History:  Mr.  [name] is back for a long awaited followup. His pain a urinalysis we last saw him. He's gained about 70 pounds since her last saw him. He's not much active. There may be an element of depression. He's had no obvious liver issues. He's not had any testosterone injections for a year and a half.  There's been no bleeding. He's eating quite a bit. Again is not all that active.  He's had no fever. He's had no rashes. He's had no infections. He's had no seizures. he's had no change in bowel or bladder habits. There's been no headache. There is some blurred vision. He's had no obvious dysphasia or odynophagia    Medications: Current outpatient prescriptions:acetaminophen (TYLENOL) 500 MG tablet, Take 1,000 mg by mouth every 6 (six) hours as needed for pain., Disp: , Rfl: ;  albuterol (PROVENTIL HFA;VENTOLIN HFA) 108 (90 BASE) MCG/ACT inhaler, Inhale 2 puffs into the lungs every 4 (four) hours as needed for wheezing., Disp: , Rfl: ;  alprazolam (XANAX) 2 MG tablet, Take 2 mg by mouth 4 (four) times daily as needed for anxiety. , Disp: , Rfl:  diazepam (VALIUM) 10 MG tablet, Take 10 mg by mouth every 12 (twelve) hours as needed for anxiety. , Disp: , Rfl: ;  dicyclomine (BENTYL) 20 MG tablet, Take 1 tablet by mouth 4 times daily., Disp: , Rfl: ;  esomeprazole (NEXIUM) 40 MG capsule, Take 40 mg by mouth at bedtime. , Disp: , Rfl: ;  fenofibrate 160 MG tablet, Take 160 mg by mouth daily., Disp: , Rfl:  Fluticasone Propionate, Inhal, (FLOVENT DISKUS) 250 MCG/BLIST AEPB, Inhale into the lungs 2 (two) times daily., Disp: , Rfl: ;  furosemide (LASIX) 80 MG tablet, Take 40 mg by mouth 2 (two) times daily. , Disp: , Rfl: ;  Gabapentin, PHN, (GRALISE) 600  MG TABS, Take by mouth daily after supper., Disp: , Rfl: ;  HYDROcodone-acetaminophen (NORCO) 10-325 MG per tablet, Take 1 tablet by mouth every 6 (six) hours as needed for pain. For pain, Disp: , Rfl:  imipramine (TOFRANIL) 50 MG tablet, Take 150 mg by mouth 2 (two) times daily., Disp: , Rfl: ;  lactulose (CHRONULAC) 10 GM/15ML solution, Take 30 mLs by mouth 3 (three) times daily as needed (for constipation). , Disp: , Rfl: ;  levothyroxine (SYNTHROID, LEVOTHROID) 100 MCG tablet, Take 100 mcg by mouth every morning. , Disp: , Rfl: ;  lisinopril (PRINIVIL,ZESTRIL) 10 MG tablet, Take 10 mg by mouth daily. 1/2 tablet as needed, Disp: , Rfl:  metoCLOPramide (REGLAN) 10 MG tablet, Take 10 mg by mouth 4 (four) times daily as needed (for stomach pain). , Disp: , Rfl: ;  morphine (MS CONTIN) 60 MG 12 hr tablet, Take 60 mg by mouth every 12 (twelve) hours. As needed for pain, Disp: , Rfl: ;  NON FORMULARY, Take by mouth every morning. Patient takes many vitamin every day  15 different ones., Disp: , Rfl:  ondansetron (ZOFRAN) 4 MG tablet, Take 4 mg by mouth every 8 (eight) hours as needed for nausea. , Disp: , Rfl: ;  polyethylene glycol powder (GLYCOLAX/MIRALAX) powder, Take 17 g by mouth daily as needed (for constipation). , Disp: , Rfl: ;  spironolactone (ALDACTONE) 50 MG tablet, Take  50 mg by mouth daily. , Disp: , Rfl: ;  tiZANidine (ZANAFLEX) 4 MG tablet, Take 4-8 mg by mouth every 8 (eight) hours as needed (for muscle spasm). , Disp: , Rfl:  topiramate (TOPAMAX) 200 MG tablet, Take 200 mg by mouth 2 (two) times daily., Disp: , Rfl: ;  venlafaxine (EFFEXOR) 75 MG tablet, Take 75 mg by mouth 2 (two) times daily. , Disp: , Rfl:   Allergies:  Allergies  Allergen Reactions  . Aspirin Other (See Comments)    Causes asthmatic symptoms  . Statins     Joint pain   . Penicillins Rash    Past Medical History, Surgical history, Social history, and Family History were reviewed and updated.  Review of  Systems: As above  Physical Exam:  height is 5\' 10"  (1.778 m) and weight is 441 lb (200.036 kg). His oral temperature is 98 F (36.7 C). His blood pressure is 123/59 and his pulse is 91. His respiration is 20.   Morbidly obese. There may be some slight ptosis of the right eye. No oral lesions. No scleritis. No adenopathy in the neck. Lungs are decreased at the bases. Cardiac exam regular rate rhythm. Abdomen morbidly obese. No obvious fluid wave. No liver or spleen tip. Back exam no tenderness. Extremities shows stasis dermatitis changes. He has chronic edema in his legs. Neurological exam was unremarkable. Joints are not swollen. Skin shows no petechia or ecchymoses. Lab Results  Component Value Date   WBC 10.2* 09/12/2013   HGB 13.4 09/12/2013   HCT 42.5 09/12/2013   MCV 84 09/12/2013   PLT 392 09/12/2013     Chemistry      Component Value Date/Time   NA 141 09/12/2013 0830   NA 131* 08/06/2012 0829   K 3.9 09/12/2013 0830   K 4.3 08/06/2012 0829   CL 99 09/12/2013 0830   CL 92* 08/06/2012 0829   CO2 29 09/12/2013 0830   CO2 23 08/06/2012 0829   BUN 10 09/12/2013 0830   BUN 19 08/06/2012 0829   CREATININE 0.8 09/12/2013 0830   CREATININE 1.51* 08/06/2012 0829      Component Value Date/Time   CALCIUM 9.7 09/12/2013 0830   CALCIUM 10.0 08/06/2012 0829   ALKPHOS 52 09/12/2013 0830   ALKPHOS 41 08/03/2012 0628   AST 47* 09/12/2013 0830   AST 17 08/03/2012 0628   ALT 57* 09/12/2013 0830   ALT 11 08/03/2012 0628   BILITOT 0.40 09/12/2013 0830   BILITOT 0.3 08/03/2012 0628     Iron saturation only 11%. Total iron 53. Testosterone 21. TSH 1.23.   Impression and Plan: Mr. [name] is 48 year old gentleman . He is morbidly obese. He is iron deficient. His was not anemic but yet he is iron deficient. 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 The thyroid is okay so this is not a problem.  We will go ahead and give him testosterone. We will give him IV iron. Hopefully this will make him feel better.  I  looked at his blood smear. He does have some microcytic red cells. I see no schistocytes. White cells and platelets are normal.  I will plan to get back to see [place] in one month. Hopefully we will see that he is feeling a little better. He clearly will take quite a bit of testosterone I think to get his levels up.  I spent over half hour with he and his wife. It is good to see him again. I feel bad that he  has gained so much weight.   Volanda Napoleon, MD 4/14/20156:43 AM

## 2013-09-13 NOTE — Progress Notes (Signed)
Spoke to patient and informed him that per Dr. Marin Olp his cxr was ok no evidence of PNA, did show some heart failure changes. He stated he saw the cardiologist a few weeks ago, but will notify them of the cxr. Pt verbalized appreciation.

## 2013-09-15 ENCOUNTER — Telehealth: Payer: Self-pay | Admitting: Hematology & Oncology

## 2013-09-15 NOTE — Telephone Encounter (Signed)
Wife called moved 4-17 to 4-24, RN checked with MD who said that was fine.

## 2013-09-16 ENCOUNTER — Ambulatory Visit: Payer: Medicare Other

## 2013-09-20 NOTE — Telephone Encounter (Signed)
error 

## 2013-09-22 ENCOUNTER — Telehealth: Payer: Self-pay | Admitting: Hematology & Oncology

## 2013-09-22 NOTE — Telephone Encounter (Signed)
Pt moved 4-24 to 4-30 he is sick

## 2013-09-23 ENCOUNTER — Ambulatory Visit: Payer: Medicare Other

## 2013-09-24 IMAGING — CT CT HEAD W/O CM
1 series · 16 of 30 positions shown, 20 images · non-contrast
Comparison: 09/12/2010

CLINICAL DATA: Weakness, dizziness, and nausea for 7 weeks.

CT HEAD WITHOUT CONTRAST
TECHNIQUE: Contiguous axial images were obtained from the base of
the skull through the vertex without contrast.

[Series 2: headseq 4.8 h37s · axial · 0.46mm/px · z∈[+1185,+1345]mm · 16 of 36 slices shown, 20 images]
[im 2/36  brain]
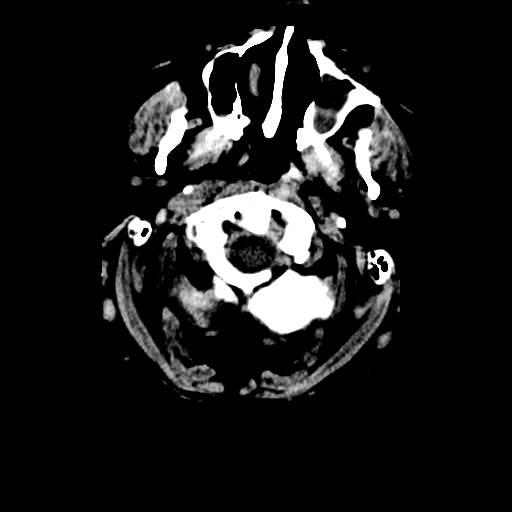
[im 2/36  bone]
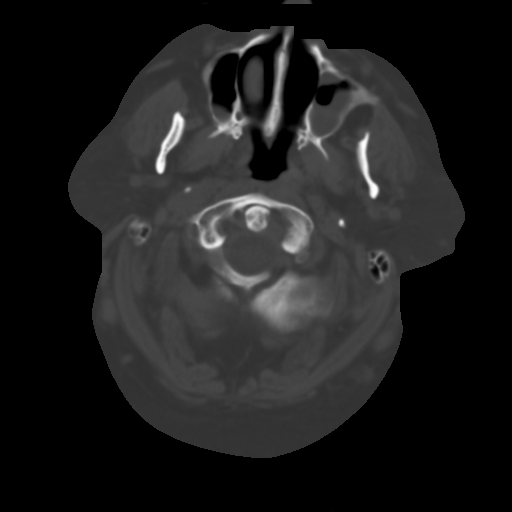
[im 4/36  brain]
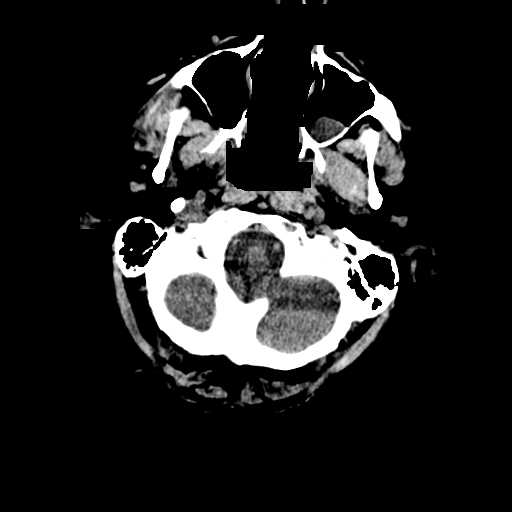
[im 7/36  brain]
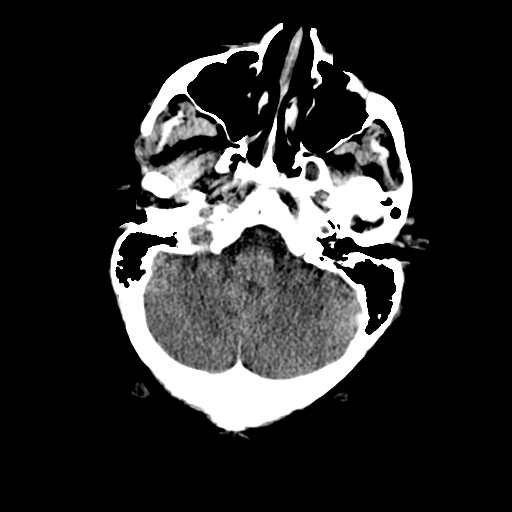
[im 9/36  brain]
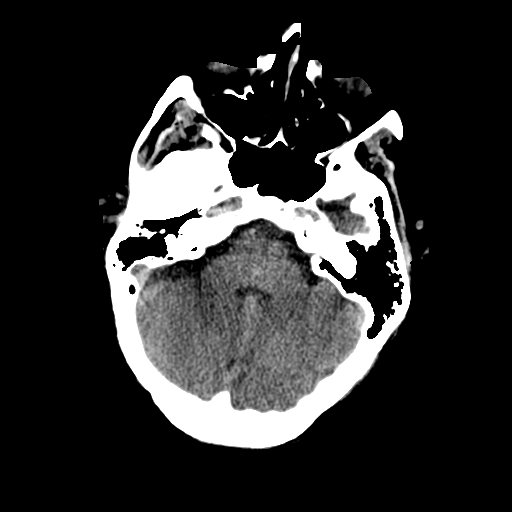
[im 10/36  brain]
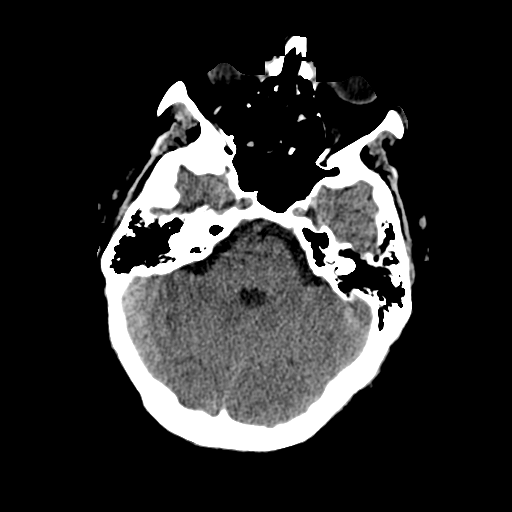
[im 10/36  bone]
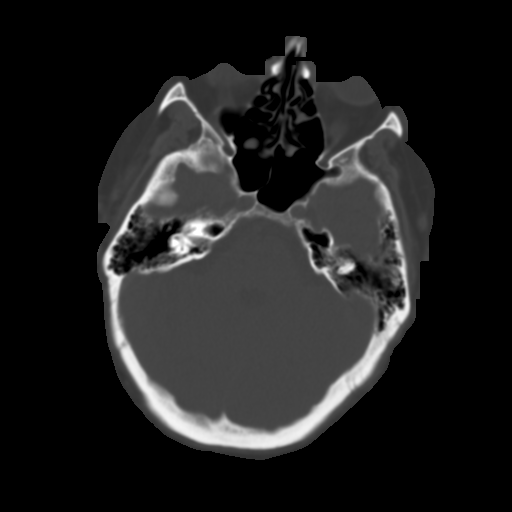
[im 13/36  brain]
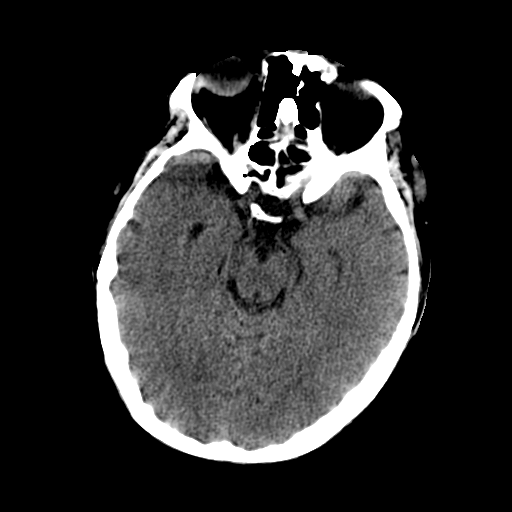
[im 15/36  brain]
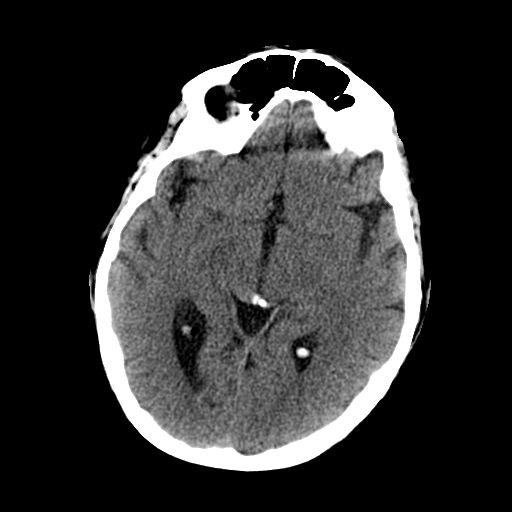
[im 17/36  brain]
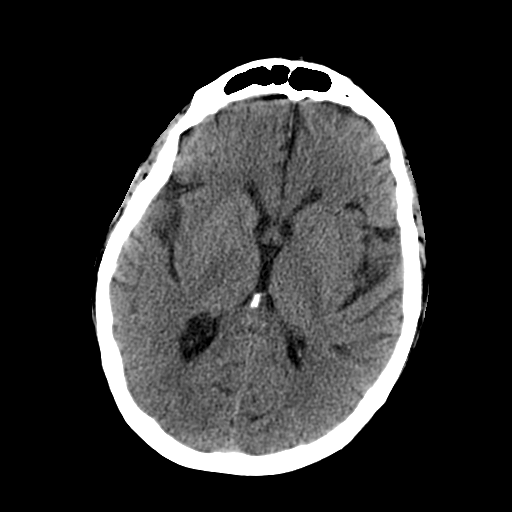
[im 19/36  brain]
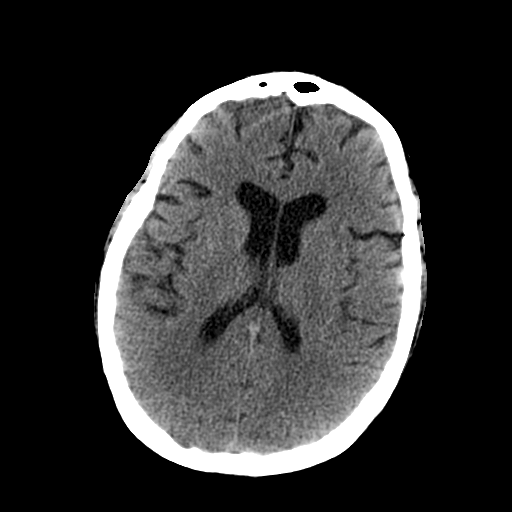
[im 19/36  bone]
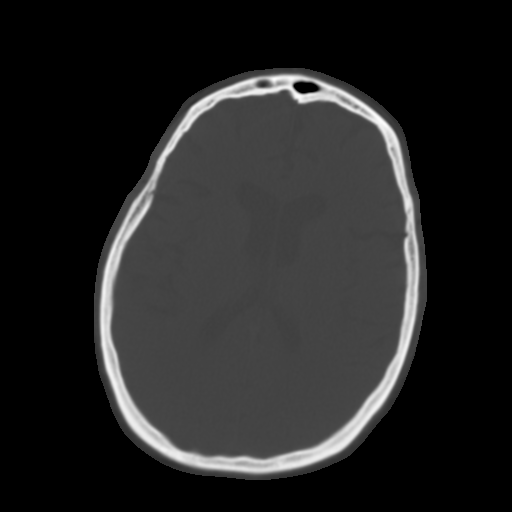
[im 21/36  brain]
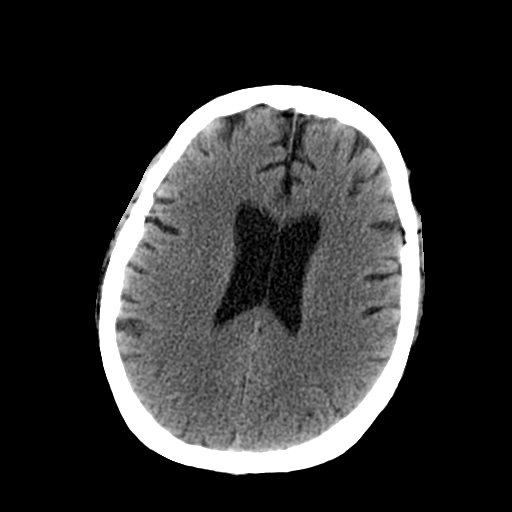
[im 23/36  brain]
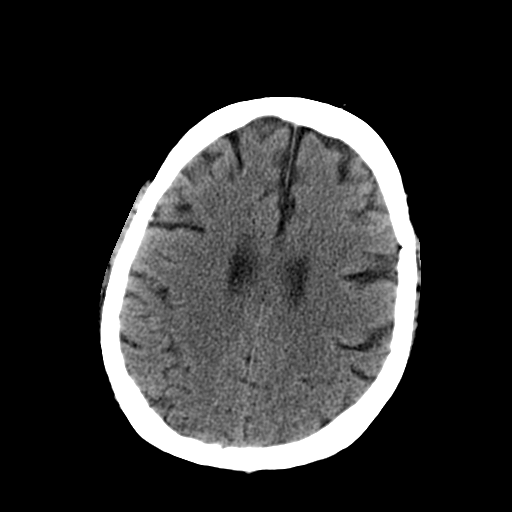
[im 26/36  brain]
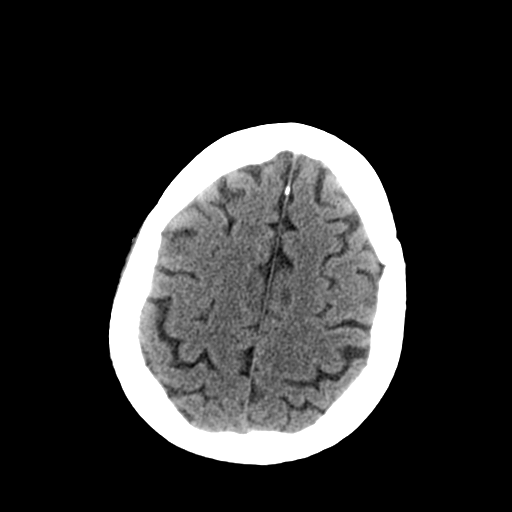
[im 27/36  brain]
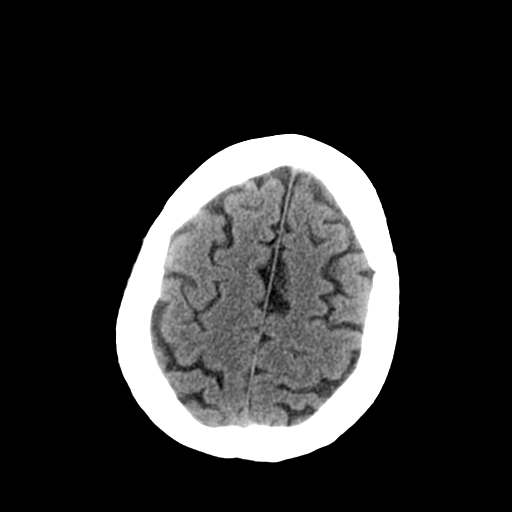
[im 27/36  bone]
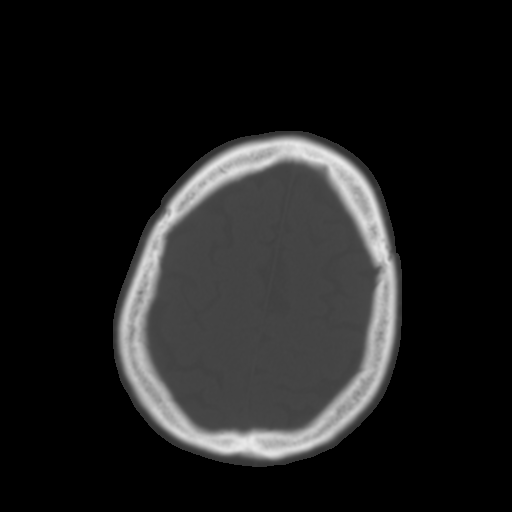
[im 29/36  brain]
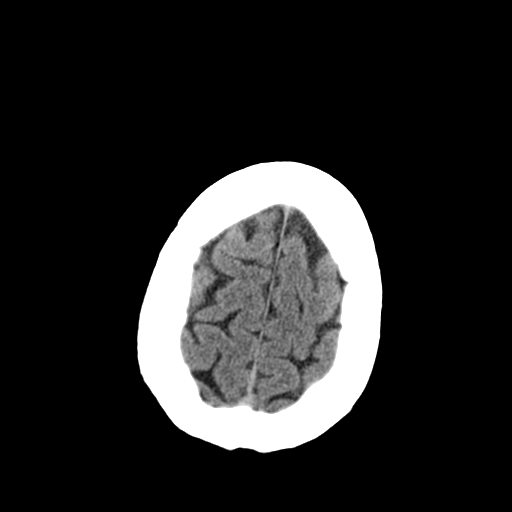
[im 32/36  brain]
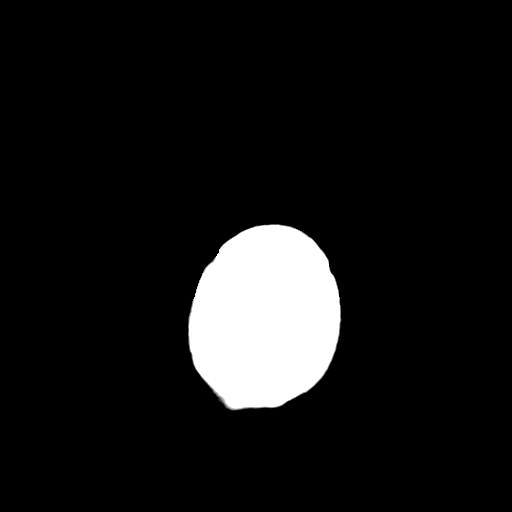
[im 34/36  brain]
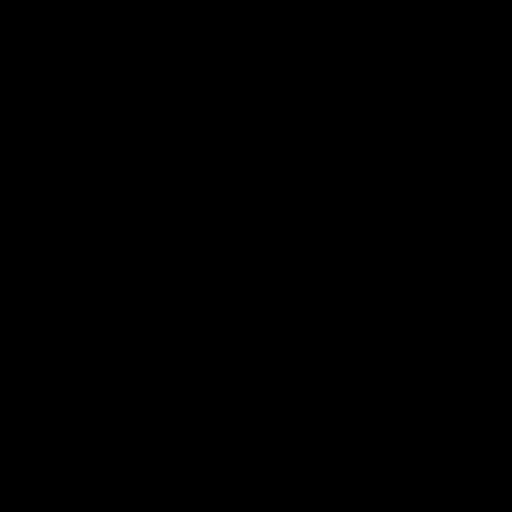

[16 of 30 positions shown; findings below may reference images not displayed]

FINDINGS: Diffuse cerebral atrophy.  Low attenuation changes in the
deep white matter consistent with small vessel ischemia.  No mass
effect or midline shift.  No abnormal extra-axial fluid
collections.  Gray-white matter junctions are distinct.  Basal
cisterns are not effaced.  Mild ventricular dilatation consistent
with central atrophy.  No evidence of acute intracranial
hemorrhage.  No significant changes since the previous study.  No
depressed skull fractures.  Retention cyst in the floor of the left
maxillary antrum.  Old nasal bone fractures.
IMPRESSION: Chronic atrophic and small vessel ischemic changes.  No evidence of
acute intracranial hemorrhage, mass lesion, or acute infarct.

## 2013-09-27 ENCOUNTER — Telehealth: Payer: Self-pay | Admitting: Hematology & Oncology

## 2013-09-27 NOTE — Telephone Encounter (Signed)
They called and cx 09/29/13 iron apt and will call back to resch

## 2013-09-29 ENCOUNTER — Ambulatory Visit: Payer: Medicare Other

## 2013-10-07 ENCOUNTER — Telehealth: Payer: Self-pay | Admitting: Hematology & Oncology

## 2013-10-07 NOTE — Telephone Encounter (Signed)
Juliann Pulse called amd cx 10/10/13 apt and stated they would call back to resch

## 2013-10-10 ENCOUNTER — Ambulatory Visit: Payer: Medicare Other

## 2013-11-07 ENCOUNTER — Telehealth: Payer: Self-pay | Admitting: Hematology & Oncology

## 2013-11-07 NOTE — Telephone Encounter (Signed)
Pt cx 6-9 said was going to PCP on the 17th and would call back after that to reschedule if needed. They are aware this was an MD appointment also. I left RN message

## 2013-11-08 ENCOUNTER — Ambulatory Visit: Payer: Medicare Other | Admitting: Hematology & Oncology

## 2013-11-08 ENCOUNTER — Other Ambulatory Visit: Payer: Medicare Other | Admitting: Lab

## 2013-11-08 ENCOUNTER — Ambulatory Visit: Payer: Medicare Other

## 2014-01-10 ENCOUNTER — Telehealth: Payer: Self-pay | Admitting: Cardiology

## 2014-01-10 NOTE — Telephone Encounter (Signed)
Patient was diagnosed with diabetes in May 2015. Dr. Olen Pel started patient on Metformin several weeks ago with caused patient to itch. Then he started taking Glimepiride which caused itching and a rash. Dr. Olen Pel wants to start patient on Actos. He is very concerned about side effects with being a heart failure patient. [name] wanted Dr. Ron Parker to know this and advise.

## 2014-01-11 NOTE — Telephone Encounter (Signed)
Patient informed and verbalized understanding of plan. 

## 2014-01-11 NOTE — Telephone Encounter (Signed)
Wife concerned about patient taking actos and having heart failure. Nurse advised patient that actos can cause edema and she should monitor for that. Nurse advised wife that MD would be made aware for further suggestions r/e safeness of taking actos with heart failure. Wife informed that MD was out of the office and once he replies to the message, she would be contacted.

## 2014-01-11 NOTE — Telephone Encounter (Signed)
Yes, Actos could cause a problem with increased edema. But, if it seems that this drug would be good to try for the patient's diabetes, it would be reasonable to try having the patient watching carefully for changes. If increased fluid weight, stop the drug. If not, use the drug. He has diastolic CHF. I'm not sure we know how Actos affects these patients.

## 2014-03-21 ENCOUNTER — Other Ambulatory Visit (HOSPITAL_COMMUNITY): Payer: Self-pay | Admitting: Gastroenterology

## 2014-03-21 DIAGNOSIS — R110 Nausea: Secondary | ICD-10-CM

## 2014-03-28 ENCOUNTER — Ambulatory Visit (HOSPITAL_COMMUNITY): Admission: RE | Admit: 2014-03-28 | Payer: Medicare Other | Source: Ambulatory Visit

## 2014-03-30 ENCOUNTER — Encounter (HOSPITAL_COMMUNITY): Payer: Self-pay | Admitting: Emergency Medicine

## 2014-03-30 DIAGNOSIS — Z880 Allergy status to penicillin: Secondary | ICD-10-CM | POA: Diagnosis not present

## 2014-03-30 DIAGNOSIS — J45909 Unspecified asthma, uncomplicated: Secondary | ICD-10-CM | POA: Insufficient documentation

## 2014-03-30 DIAGNOSIS — E669 Obesity, unspecified: Secondary | ICD-10-CM | POA: Insufficient documentation

## 2014-03-30 DIAGNOSIS — F319 Bipolar disorder, unspecified: Secondary | ICD-10-CM | POA: Diagnosis not present

## 2014-03-30 DIAGNOSIS — Z9981 Dependence on supplemental oxygen: Secondary | ICD-10-CM | POA: Insufficient documentation

## 2014-03-30 DIAGNOSIS — R51 Headache: Secondary | ICD-10-CM | POA: Diagnosis present

## 2014-03-30 DIAGNOSIS — E119 Type 2 diabetes mellitus without complications: Secondary | ICD-10-CM | POA: Diagnosis not present

## 2014-03-30 DIAGNOSIS — G629 Polyneuropathy, unspecified: Secondary | ICD-10-CM | POA: Diagnosis not present

## 2014-03-30 DIAGNOSIS — Z79899 Other long term (current) drug therapy: Secondary | ICD-10-CM | POA: Insufficient documentation

## 2014-03-30 DIAGNOSIS — R110 Nausea: Secondary | ICD-10-CM | POA: Diagnosis not present

## 2014-03-30 DIAGNOSIS — I5030 Unspecified diastolic (congestive) heart failure: Secondary | ICD-10-CM | POA: Insufficient documentation

## 2014-03-30 DIAGNOSIS — M1990 Unspecified osteoarthritis, unspecified site: Secondary | ICD-10-CM | POA: Insufficient documentation

## 2014-03-30 DIAGNOSIS — E039 Hypothyroidism, unspecified: Secondary | ICD-10-CM | POA: Insufficient documentation

## 2014-03-30 DIAGNOSIS — I10 Essential (primary) hypertension: Secondary | ICD-10-CM | POA: Diagnosis not present

## 2014-03-30 DIAGNOSIS — E785 Hyperlipidemia, unspecified: Secondary | ICD-10-CM | POA: Insufficient documentation

## 2014-03-30 DIAGNOSIS — G4730 Sleep apnea, unspecified: Secondary | ICD-10-CM | POA: Insufficient documentation

## 2014-03-30 DIAGNOSIS — Z720 Tobacco use: Secondary | ICD-10-CM | POA: Diagnosis not present

## 2014-03-30 MED ORDER — ONDANSETRON 4 MG PO TBDP
8.0000 mg | ORAL_TABLET | Freq: Once | ORAL | Status: AC
  Administered 2014-03-30: 8 mg via ORAL
  Filled 2014-03-30: qty 2

## 2014-03-30 NOTE — ED Notes (Signed)
Presents with one year of headaches off and on and nausea. For the past month headaches have gotten worse are associated with sensitivity to light and sound and behind both eyes. Today the headache was severe associated with nausea and senstitivity to light and sound. Endorses blurred vision with headaches. Denies dizziness. Family reports that he sleeps a lot and has been seeing a gastroenterologist for the nausea.

## 2014-03-31 ENCOUNTER — Emergency Department (HOSPITAL_COMMUNITY)
Admission: EM | Admit: 2014-03-31 | Discharge: 2014-03-31 | Disposition: A | Discharge status: Home / Self Care | Payer: Medicare Other | Attending: Emergency Medicine | Admitting: Emergency Medicine

## 2014-03-31 DIAGNOSIS — R519 Headache, unspecified: Secondary | ICD-10-CM

## 2014-03-31 DIAGNOSIS — R51 Headache: Secondary | ICD-10-CM

## 2014-03-31 HISTORY — DX: Type 2 diabetes mellitus without complications: E11.9

## 2014-03-31 HISTORY — DX: Polyneuropathy, unspecified: G62.9

## 2014-03-31 MED ORDER — METOCLOPRAMIDE HCL 10 MG PO TABS
10.0000 mg | ORAL_TABLET | Freq: Once | ORAL | Status: AC
  Administered 2014-03-31: 10 mg via ORAL
  Filled 2014-03-31: qty 1

## 2014-03-31 MED ORDER — IBUPROFEN 800 MG PO TABS: 800.0000 mg | ORAL_TABLET | Freq: Once | ORAL | Status: DC

## 2014-03-31 MED ORDER — ACETAMINOPHEN 500 MG PO TABS
1000.0000 mg | ORAL_TABLET | Freq: Once | ORAL | Status: AC
  Administered 2014-03-31: 1000 mg via ORAL
  Filled 2014-03-31: qty 2

## 2014-03-31 MED ORDER — METOCLOPRAMIDE HCL 10 MG PO TABS: 10.0000 mg | ORAL_TABLET | Freq: Three times a day (TID) | ORAL | Status: DC | PRN

## 2014-03-31 MED ORDER — DIPHENHYDRAMINE HCL 25 MG PO CAPS
50.0000 mg | ORAL_CAPSULE | Freq: Once | ORAL | Status: AC
  Administered 2014-03-31: 50 mg via ORAL
  Filled 2014-03-31: qty 2

## 2014-03-31 NOTE — ED Provider Notes (Signed)
CSN: [id number]     Arrival date & time 03/30/14  2123 History   First MD Initiated Contact with Patient 03/31/14 0120     Chief Complaint  Patient presents with  . Headache     (Consider location/radiation/quality/duration/timing/severity/associated sxs/prior Treatment) HPI AMUN STEMM is a 48 y.o. male with past medical history of hypertension, CHF, hyperlipidemia, obesity, sleep apnea, diabetes coming in with headache. Patient states she's had a headache for a year however used to be intermittent every 2 or 3 days. Over the last 3 weeks patient states it is been present every day. Is bilateral behind his eyes. It is a dull pain. Patient states it's worse when he watches TV and he isn't taking Excedrin for relief. Associated with nausea but no vomiting. He's had no fevers or recent infections. He does have numbness and tingling which is chronic for him due to his neuropathy of his bilateral lower extremities. He denies any muscle weakness. Evaluated for this by his primary care physician. Patient has no further complaints.  10 Systems reviewed and are negative for acute change except as noted in the HPI.     Past Medical History  Diagnosis Date  . Hypertension   . CHF (congestive heart failure)     Diastolic  . Arthritis   . Hyperlipemia   . Sleep apnea     He wore CPAP once  . Asthma   . Schizophrenia   . Hypothyroidism   . Bipolar 1 disorder   . Ejection fraction     EF 50-55%, March, 2012, technically difficult study, some RV dilatation  . Venous insufficiency   . Lithium toxicity     April, 2012  . Edema     Secondary to diastolic CHF  . Obesity   . Statin intolerance     Muscle aches from statins in the past  . Manic depression   . Diabetes   . Peripheral neuropathy    Past Surgical History  Procedure Laterality Date  . Tonsillectomy     History reviewed. No pertinent family history. History  Substance Use Topics  . Smoking status: Current Every Day  Smoker -- 3.00 packs/day for 20 years    Types: Cigarettes    Start date: 03/14/1984  . Smokeless tobacco: Never Used     Comment: 09-12-13 cut back to 2 pk daily  . Alcohol Use: No    Review of Systems    Allergies  Aspirin; Statins; and Penicillins  Home Medications   Prior to Admission medications   Medication Sig Start Date End Date Taking? Authorizing Provider  acetaminophen (TYLENOL) 500 MG tablet Take 1,000 mg by mouth every 6 (six) hours as needed for pain.   Yes Historical Provider, MD  albuterol (PROVENTIL HFA;VENTOLIN HFA) 108 (90 BASE) MCG/ACT inhaler Inhale 2 puffs into the lungs every 4 (four) hours as needed for wheezing.   Yes Historical Provider, MD  alprazolam Duanne Moron) 2 MG tablet Take 2 mg by mouth 4 (four) times daily as needed for anxiety.    Yes Historical Provider, MD  clomiPRAMINE (ANAFRANIL) 25 MG capsule Take 50 mg by mouth 2 (two) times daily.   Yes Historical Provider, MD  diazepam (VALIUM) 10 MG tablet Take 10 mg by mouth at bedtime.  08/17/12  Yes Historical Provider, MD  dicyclomine (BENTYL) 20 MG tablet Take 1 tablet by mouth 4 times daily. 09/23/11  Yes Historical Provider, MD  esomeprazole (NEXIUM) 40 MG capsule Take 40 mg by mouth at bedtime.  Yes Historical Provider, MD  fenofibrate 160 MG tablet Take 160 mg by mouth daily.   Yes Historical Provider, MD  furosemide (LASIX) 80 MG tablet Take 40 mg by mouth 2 (two) times daily.    Yes Historical Provider, MD  Gabapentin, PHN, (GRALISE) 600 MG TABS Take 1,800 mg by mouth 2 (two) times daily.    Yes Historical Provider, MD  HYDROcodone-acetaminophen (NORCO) 10-325 MG per tablet Take 1 tablet by mouth every 6 (six) hours as needed for pain. For pain   Yes Historical Provider, MD  lactulose (CHRONULAC) 10 GM/15ML solution Take 30 mLs by mouth 3 (three) times daily as needed (for constipation).  09/23/11  Yes Historical Provider, MD  levothyroxine (SYNTHROID, LEVOTHROID) 100 MCG tablet Take 100 mcg by mouth  every morning.    Yes Historical Provider, MD  lisinopril (PRINIVIL,ZESTRIL) 10 MG tablet Take 10 mg by mouth daily.  08/20/12  Yes Charlynne Cousins, MD  metoCLOPramide (REGLAN) 10 MG tablet Take 10 mg by mouth 4 (four) times daily as needed (for stomach pain).  09/27/11  Yes Historical Provider, MD  morphine (MS CONTIN) 60 MG 12 hr tablet Take 60 mg by mouth daily.    Yes Historical Provider, MD  Multiple Vitamins-Minerals (MULTIVITAMIN WITH MINERALS) tablet Take 1 tablet by mouth daily.   Yes Historical Provider, MD  ondansetron (ZOFRAN) 4 MG tablet Take 4 mg by mouth every 8 (eight) hours as needed for nausea.  02/05/12  Yes Historical Provider, MD  polyethylene glycol powder (GLYCOLAX/MIRALAX) powder Take 17 g by mouth daily as needed (for constipation).  02/05/12  Yes Historical Provider, MD  spironolactone (ALDACTONE) 50 MG tablet Take 50 mg by mouth daily.  02/04/12  Yes Historical Provider, MD  tiZANidine (ZANAFLEX) 4 MG tablet Take 4-8 mg by mouth every 8 (eight) hours as needed (for muscle spasm).  01/19/12  Yes Historical Provider, MD  venlafaxine (EFFEXOR) 75 MG tablet Take 75 mg by mouth 2 (two) times daily.    Yes Historical Provider, MD   BP 134/71  Pulse 104  Temp(Src) 99.4 F (37.4 C) (Oral)  Resp 22  Ht 6\' 1"  (1.854 m)  Wt 410 lb (185.975 kg)  BMI 54.10 kg/m2  SpO2 94% Physical Exam  Nursing note and vitals reviewed. Constitutional: He is oriented to person, place, and time. Vital signs are normal. He appears well-developed and well-nourished.  Non-toxic appearance. He does not appear ill. No distress.  Obese male  HENT:  Head: Normocephalic and atraumatic.  Nose: Nose normal.  Mouth/Throat: Oropharynx is clear and moist. No oropharyngeal exudate.  Eyes: Conjunctivae and EOM are normal. Pupils are equal, round, and reactive to light. No scleral icterus.  Left eye is 20/20 vision, right eye only detects light.  Neck: Normal range of motion. Neck supple. No tracheal deviation,  no edema, no erythema and normal range of motion present. No mass and no thyromegaly present.  Cardiovascular: Normal rate, regular rhythm, S1 normal, S2 normal, normal heart sounds, intact distal pulses and normal pulses.  Exam reveals no gallop and no friction rub.   No murmur heard. Pulses:      Radial pulses are 2+ on the right side, and 2+ on the left side.       Dorsalis pedis pulses are 2+ on the right side, and 2+ on the left side.  Pulmonary/Chest: Effort normal and breath sounds normal. No respiratory distress. He has no wheezes. He has no rhonchi. He has no rales.  Abdominal: Soft.  Normal appearance and bowel sounds are normal. He exhibits no distension, no ascites and no mass. There is no hepatosplenomegaly. There is no tenderness. There is no rebound, no guarding and no CVA tenderness.  Musculoskeletal: Normal range of motion. He exhibits no edema and no tenderness.  Lymphadenopathy:    He has no cervical adenopathy.  Neurological: He is alert and oriented to person, place, and time. He has normal strength. No cranial nerve deficit or sensory deficit. He exhibits normal muscle tone. GCS eye subscore is 4. GCS verbal subscore is 5. GCS motor subscore is 6.  55 strength 4 histories. Normal sensation in upper extremities, neuropathy noted to bilateral lower histories. Normal cerebellar testing.  Skin: Skin is warm, dry and intact. No petechiae and no rash noted. He is not diaphoretic. No erythema. No pallor.  Psychiatric: He has a normal mood and affect. His behavior is normal. Judgment normal.    ED Course  Procedures (including critical care time) Labs Review Labs Reviewed - No data to display  Imaging Review No results found.   EKG Interpretation None      MDM   Final diagnoses:  None    Patient presents emergency department for headache and nausea for the past year. It has worsened over the last 3 weeks gradually. There was no sudden onset.  I have low suspicion for  [facility] or meningitis.  Patient could have pseudotumor as a cause of this. I could not see papilledema on exam and his vision is at his normal baseline.  He was educated on the diagnosis and encouraged to follow-up with neurology outpatient. Referral given.  Patient was given reglan, tylenol and benadryl in the emergency department to treat his headache and nausea.  Upon repeat evaluation, patient states his headache has subsided.  Given reglan tablets as rx.  Vital signs remain within normal limits and he is safe for discharge.    Everlene Balls, MD 03/31/14 1332

## 2014-03-31 NOTE — ED Notes (Addendum)
Back to room in w/c, family at [facility].  Pt alert, NAD, calm, interactive, resps e/u, speaking in clear complete sentences. C/o nausea and HA. (denies: vd, fever, bleeding, abd pain, back pain, other pain, sob or dizziness).

## 2014-03-31 NOTE — Discharge Instructions (Signed)
Headaches, Frequently Asked Questions [name], you were seen today for headache. Follow-up with neurology for continued treatment. If her symptoms worsen come back to the emergency department immediately for repeat evaluation. He can take Reglan as prescribed. Thank you. MIGRAINE HEADACHES Q: What is migraine? What causes it? How can I treat it? A: Generally, migraine headaches begin as a dull ache. Then they develop into a constant, throbbing, and pulsating pain. You may experience pain at the temples. You may experience pain at the front or back of one or both sides of the head. The pain is usually accompanied by a combination of:  Nausea.  Vomiting.  Sensitivity to light and noise. Some people (about 15%) experience an aura (see below) before an attack. The cause of migraine is believed to be chemical reactions in the brain. Treatment for migraine may include over-the-counter or prescription medications. It may also include self-help techniques. These include relaxation training and biofeedback.  Q: What is an aura? A: About 15% of people with migraine get an "aura". This is a sign of neurological symptoms that occur before a migraine headache. You may see wavy or jagged lines, dots, or flashing lights. You might experience tunnel vision or blind spots in one or both eyes. The aura can include visual or auditory hallucinations (something imagined). It may include disruptions in smell (such as strange odors), taste or touch. Other symptoms include:  Numbness.  A "pins and needles" sensation.  Difficulty in recalling or speaking the correct word. These neurological events may last as long as 60 minutes. These symptoms will fade as the headache begins. Q: What is a trigger? A: Certain physical or environmental factors can lead to or "trigger" a migraine. These include:  Foods.  Hormonal changes.  Weather.  Stress. It is important to remember that triggers are different for everyone.  To help prevent migraine attacks, you need to figure out which triggers affect you. Keep a headache diary. This is a good way to track triggers. The diary will help you talk to your healthcare professional about your condition. Q: Does weather affect migraines? A: Bright sunshine, hot, humid conditions, and drastic changes in barometric pressure may lead to, or "trigger," a migraine attack in some people. But studies have shown that weather does not act as a trigger for everyone with migraines. Q: What is the link between migraine and hormones? A: Hormones start and regulate many of your body's functions. Hormones keep your body in balance within a constantly changing environment. The levels of hormones in your body are unbalanced at times. Examples are during menstruation, pregnancy, or menopause. That can lead to a migraine attack. In fact, about three quarters of all women with migraine report that their attacks are related to the menstrual cycle.  Q: Is there an increased risk of stroke for migraine sufferers? A: The likelihood of a migraine attack causing a stroke is very remote. That is not to say that migraine sufferers cannot have a stroke associated with their migraines. In persons under age 54, the most common associated factor for stroke is migraine headache. But over the course of a person's normal life span, the occurrence of migraine headache may actually be associated with a reduced risk of dying from cerebrovascular disease due to stroke.  Q: What are acute medications for migraine? A: Acute medications are used to treat the pain of the headache after it has started. Examples over-the-counter medications, NSAIDs, ergots, and triptans.  Q: What are the triptans? A:  Triptans are the newest class of abortive medications. They are specifically targeted to treat migraine. Triptans are vasoconstrictors. They moderate some chemical reactions in the brain. The triptans work on receptors in your  brain. Triptans help to restore the balance of a neurotransmitter called serotonin. Fluctuations in levels of serotonin are thought to be a main cause of migraine.  Q: Are over-the-counter medications for migraine effective? A: Over-the-counter, or "OTC," medications may be effective in relieving mild to moderate pain and associated symptoms of migraine. But you should see your caregiver before beginning any treatment regimen for migraine.  Q: What are preventive medications for migraine? A: Preventive medications for migraine are sometimes referred to as "prophylactic" treatments. They are used to reduce the frequency, severity, and length of migraine attacks. Examples of preventive medications include antiepileptic medications, antidepressants, beta-blockers, calcium channel blockers, and NSAIDs (nonsteroidal anti-inflammatory drugs). Q: Why are anticonvulsants used to treat migraine? A: During the past few years, there has been an increased interest in antiepileptic drugs for the prevention of migraine. They are sometimes referred to as "anticonvulsants". Both epilepsy and migraine may be caused by similar reactions in the brain.  Q: Why are antidepressants used to treat migraine? A: Antidepressants are typically used to treat people with depression. They may reduce migraine frequency by regulating chemical levels, such as serotonin, in the brain.  Q: What alternative therapies are used to treat migraine? A: The term "alternative therapies" is often used to describe treatments considered outside the scope of conventional Western medicine. Examples of alternative therapy include acupuncture, acupressure, and yoga. Another common alternative treatment is herbal therapy. Some herbs are believed to relieve headache pain. Always discuss alternative therapies with your caregiver before proceeding. Some herbal products contain arsenic and other toxins. TENSION HEADACHES Q: What is a tension-type headache?  What causes it? How can I treat it? A: Tension-type headaches occur randomly. They are often the result of temporary stress, anxiety, fatigue, or anger. Symptoms include soreness in your temples, a tightening band-like sensation around your head (a "vice-like" ache). Symptoms can also include a pulling feeling, pressure sensations, and contracting head and neck muscles. The headache begins in your forehead, temples, or the back of your head and neck. Treatment for tension-type headache may include over-the-counter or prescription medications. Treatment may also include self-help techniques such as relaxation training and biofeedback. CLUSTER HEADACHES Q: What is a cluster headache? What causes it? How can I treat it? A: Cluster headache gets its name because the attacks come in groups. The pain arrives with little, if any, warning. It is usually on one side of the head. A tearing or bloodshot eye and a runny nose on the same side of the headache may also accompany the pain. Cluster headaches are believed to be caused by chemical reactions in the brain. They have been described as the most severe and intense of any headache type. Treatment for cluster headache includes prescription medication and oxygen. SINUS HEADACHES Q: What is a sinus headache? What causes it? How can I treat it? A: When a cavity in the bones of the face and skull (a sinus) becomes inflamed, the inflammation will cause localized pain. This condition is usually the result of an allergic reaction, a tumor, or an infection. If your headache is caused by a sinus blockage, such as an infection, you will probably have a fever. An x-ray will confirm a sinus blockage. Your caregiver's treatment might include antibiotics for the infection, as well as antihistamines or decongestants.  REBOUND HEADACHES Q: What is a rebound headache? What causes it? How can I treat it? A: A pattern of taking acute headache medications too often can lead to a  condition known as "rebound headache." A pattern of taking too much headache medication includes taking it more than 2 days per week or in excessive amounts. That means more than the label or a caregiver advises. With rebound headaches, your medications not only stop relieving pain, they actually begin to cause headaches. Doctors treat rebound headache by tapering the medication that is being overused. Sometimes your caregiver will gradually substitute a different type of treatment or medication. Stopping may be a challenge. Regularly overusing a medication increases the potential for serious side effects. Consult a caregiver if you regularly use headache medications more than 2 days per week or more than the label advises. ADDITIONAL QUESTIONS AND ANSWERS Q: What is biofeedback? A: Biofeedback is a self-help treatment. Biofeedback uses special equipment to monitor your body's involuntary physical responses. Biofeedback monitors:  Breathing.  Pulse.  Heart rate.  Temperature.  Muscle tension.  Brain activity. Biofeedback helps you refine and perfect your relaxation exercises. You learn to control the physical responses that are related to stress. Once the technique has been mastered, you do not need the equipment any more. Q: Are headaches hereditary? A: Four out of five (80%) of people that suffer report a family history of migraine. Scientists are not sure if this is genetic or a family predisposition. Despite the uncertainty, a child has a 50% chance of having migraine if one parent suffers. The child has a 75% chance if both parents suffer.  Q: Can children get headaches? A: By the time they reach high school, most young people have experienced some type of headache. Many safe and effective approaches or medications can prevent a headache from occurring or stop it after it has begun.  Q: What type of doctor should I see to diagnose and treat my headache? A: Start with your primary  caregiver. Discuss his or her experience and approach to headaches. Discuss methods of classification, diagnosis, and treatment. Your caregiver may decide to recommend you to a headache specialist, depending upon your symptoms or other physical conditions. Having diabetes, allergies, etc., may require a more comprehensive and inclusive approach to your headache. The National Headache Foundation will provide, upon request, a list of [facility] physician members in your state. Document Released: 08/09/2003 Document Revised: 08/11/2011 Document Reviewed: 01/17/2008 [facility] Patient Information 2015 [place], [place]. This information is not intended to replace advice given to you by your health care provider. Make sure you discuss any questions you have with your health care provider.

## 2014-04-04 ENCOUNTER — Ambulatory Visit (HOSPITAL_COMMUNITY): Payer: Medicare Other

## 2014-05-21 ENCOUNTER — Inpatient Hospital Stay (HOSPITAL_COMMUNITY)
Admission: AD | Admit: 2014-05-21 | Discharge: 2014-05-27 | DRG: 683 | Disposition: A | Discharge status: Home Health | Payer: Medicare Other | Source: Other Acute Inpatient Hospital | Attending: Internal Medicine | Admitting: Internal Medicine

## 2014-05-21 DIAGNOSIS — N189 Chronic kidney disease, unspecified: Secondary | ICD-10-CM

## 2014-05-21 DIAGNOSIS — E039 Hypothyroidism, unspecified: Secondary | ICD-10-CM | POA: Diagnosis present

## 2014-05-21 DIAGNOSIS — E871 Hypo-osmolality and hyponatremia: Secondary | ICD-10-CM

## 2014-05-21 DIAGNOSIS — M1990 Unspecified osteoarthritis, unspecified site: Secondary | ICD-10-CM | POA: Diagnosis present

## 2014-05-21 DIAGNOSIS — F17210 Nicotine dependence, cigarettes, uncomplicated: Secondary | ICD-10-CM | POA: Diagnosis present

## 2014-05-21 DIAGNOSIS — Z880 Allergy status to penicillin: Secondary | ICD-10-CM

## 2014-05-21 DIAGNOSIS — E1142 Type 2 diabetes mellitus with diabetic polyneuropathy: Secondary | ICD-10-CM | POA: Diagnosis present

## 2014-05-21 DIAGNOSIS — R339 Retention of urine, unspecified: Secondary | ICD-10-CM | POA: Diagnosis present

## 2014-05-21 DIAGNOSIS — L0390 Cellulitis, unspecified: Secondary | ICD-10-CM | POA: Insufficient documentation

## 2014-05-21 DIAGNOSIS — I10 Essential (primary) hypertension: Secondary | ICD-10-CM | POA: Diagnosis present

## 2014-05-21 DIAGNOSIS — G894 Chronic pain syndrome: Secondary | ICD-10-CM | POA: Diagnosis present

## 2014-05-21 DIAGNOSIS — E785 Hyperlipidemia, unspecified: Secondary | ICD-10-CM | POA: Diagnosis present

## 2014-05-21 DIAGNOSIS — E872 Acidosis: Secondary | ICD-10-CM | POA: Diagnosis not present

## 2014-05-21 DIAGNOSIS — L03115 Cellulitis of right lower limb: Secondary | ICD-10-CM | POA: Diagnosis present

## 2014-05-21 DIAGNOSIS — F319 Bipolar disorder, unspecified: Secondary | ICD-10-CM | POA: Diagnosis present

## 2014-05-21 DIAGNOSIS — L03116 Cellulitis of left lower limb: Secondary | ICD-10-CM | POA: Diagnosis present

## 2014-05-21 DIAGNOSIS — Z79891 Long term (current) use of opiate analgesic: Secondary | ICD-10-CM

## 2014-05-21 DIAGNOSIS — Z79899 Other long term (current) drug therapy: Secondary | ICD-10-CM | POA: Diagnosis not present

## 2014-05-21 DIAGNOSIS — N139 Obstructive and reflux uropathy, unspecified: Secondary | ICD-10-CM | POA: Diagnosis present

## 2014-05-21 DIAGNOSIS — Z6841 Body Mass Index (BMI) 40.0 and over, adult: Secondary | ICD-10-CM

## 2014-05-21 DIAGNOSIS — I959 Hypotension, unspecified: Secondary | ICD-10-CM | POA: Diagnosis present

## 2014-05-21 DIAGNOSIS — N179 Acute kidney failure, unspecified: Principal | ICD-10-CM | POA: Diagnosis present

## 2014-05-21 DIAGNOSIS — E875 Hyperkalemia: Secondary | ICD-10-CM | POA: Diagnosis present

## 2014-05-21 DIAGNOSIS — E876 Hypokalemia: Secondary | ICD-10-CM | POA: Diagnosis not present

## 2014-05-21 DIAGNOSIS — Z9119 Patient's noncompliance with other medical treatment and regimen: Secondary | ICD-10-CM | POA: Diagnosis present

## 2014-05-21 DIAGNOSIS — I872 Venous insufficiency (chronic) (peripheral): Secondary | ICD-10-CM | POA: Diagnosis present

## 2014-05-21 DIAGNOSIS — R197 Diarrhea, unspecified: Secondary | ICD-10-CM | POA: Diagnosis present

## 2014-05-21 DIAGNOSIS — F209 Schizophrenia, unspecified: Secondary | ICD-10-CM | POA: Diagnosis present

## 2014-05-21 DIAGNOSIS — E669 Obesity, unspecified: Secondary | ICD-10-CM

## 2014-05-21 DIAGNOSIS — R112 Nausea with vomiting, unspecified: Secondary | ICD-10-CM | POA: Diagnosis present

## 2014-05-21 DIAGNOSIS — I5032 Chronic diastolic (congestive) heart failure: Secondary | ICD-10-CM | POA: Diagnosis present

## 2014-05-21 DIAGNOSIS — Z888 Allergy status to other drugs, medicaments and biological substances status: Secondary | ICD-10-CM

## 2014-05-21 DIAGNOSIS — J45909 Unspecified asthma, uncomplicated: Secondary | ICD-10-CM | POA: Diagnosis present

## 2014-05-21 DIAGNOSIS — G4733 Obstructive sleep apnea (adult) (pediatric): Secondary | ICD-10-CM | POA: Diagnosis present

## 2014-05-21 DIAGNOSIS — N319 Neuromuscular dysfunction of bladder, unspecified: Secondary | ICD-10-CM | POA: Diagnosis present

## 2014-05-21 DIAGNOSIS — N19 Unspecified kidney failure: Secondary | ICD-10-CM

## 2014-05-21 DIAGNOSIS — L03119 Cellulitis of unspecified part of limb: Secondary | ICD-10-CM

## 2014-05-21 MED ORDER — SODIUM CHLORIDE 0.9 % IV SOLN: 250.0000 mL | INTRAVENOUS | Status: DC | PRN

## 2014-05-21 MED ORDER — ALPRAZOLAM 0.5 MG PO TABS
0.5000 mg | ORAL_TABLET | Freq: Three times a day (TID) | ORAL | Status: DC | PRN
  Administered 2014-05-22 – 2014-05-25 (×3): 0.5 mg via ORAL
  Filled 2014-05-21 (×3): qty 1

## 2014-05-21 MED ORDER — ONDANSETRON HCL 4 MG/2ML IJ SOLN
4.0000 mg | Freq: Four times a day (QID) | INTRAMUSCULAR | Status: DC | PRN
  Administered 2014-05-22: 4 mg via INTRAVENOUS
  Filled 2014-05-21: qty 2

## 2014-05-21 MED ORDER — LEVOTHYROXINE SODIUM 100 MCG PO TABS
100.0000 ug | ORAL_TABLET | Freq: Every day | ORAL | Status: DC
  Administered 2014-05-22 – 2014-05-27 (×6): 100 ug via ORAL
  Filled 2014-05-21 (×9): qty 1

## 2014-05-21 MED ORDER — DOXYCYCLINE HYCLATE 100 MG PO TABS
100.0000 mg | ORAL_TABLET | Freq: Two times a day (BID) | ORAL | Status: DC
  Administered 2014-05-22 (×2): 100 mg via ORAL
  Filled 2014-05-21 (×3): qty 1

## 2014-05-21 MED ORDER — INSULIN ASPART 100 UNIT/ML ~~LOC~~ SOLN
0.0000 [IU] | Freq: Three times a day (TID) | SUBCUTANEOUS | Status: DC
  Administered 2014-05-24 – 2014-05-25 (×2): 2 [IU] via SUBCUTANEOUS
  Administered 2014-05-27: 3 [IU] via SUBCUTANEOUS

## 2014-05-21 MED ORDER — HEPARIN SODIUM (PORCINE) 5000 UNIT/ML IJ SOLN
5000.0000 [IU] | Freq: Three times a day (TID) | INTRAMUSCULAR | Status: DC
  Administered 2014-05-22 – 2014-05-27 (×16): 5000 [IU] via SUBCUTANEOUS
  Filled 2014-05-21 (×22): qty 1

## 2014-05-21 MED ORDER — PANTOPRAZOLE SODIUM 40 MG IV SOLR
40.0000 mg | Freq: Every day | INTRAVENOUS | Status: DC
  Administered 2014-05-22: 40 mg via INTRAVENOUS
  Filled 2014-05-21 (×2): qty 40

## 2014-05-21 MED ORDER — ALBUTEROL SULFATE (2.5 MG/3ML) 0.083% IN NEBU: 2.5000 mg | INHALATION_SOLUTION | RESPIRATORY_TRACT | Status: DC | PRN

## 2014-05-21 MED ORDER — SODIUM CHLORIDE 0.9 % IV SOLN
INTRAVENOUS | Status: DC
  Administered 2014-05-22: 50 mL/h via INTRAVENOUS
  Administered 2014-05-22 – 2014-05-24 (×2): via INTRAVENOUS

## 2014-05-21 NOTE — H&P (Signed)
PULMONARY / CRITICAL CARE MEDICINE   Name: [name] MRN: [id number] DOB: Nov 14, 1965    ADMISSION DATE:  05/21/2014  REFERRING MD :  OSH  CHIEF COMPLAINT: urinary retention  INITIAL PRESENTATION: 48yo morbidly obese male with hx HTN, OSA, DM, bipolar, schizophrenia presented to outside hospital with c/o urinary retention n/v and weakness x 4 days.  Found to have acute on chronic renal failure with SCr >7 and hyponatremia (Na 115).  2.4L out on initial insertion of foley. Tx to [facility] for further eval.    STUDIES:  2D echo 12/20>>>  SIGNIFICANT EVENTS:      HISTORY OF PRESENT ILLNESS:  48yo morbidly obese male with hx HTN, OSA, DM, bipolar, schizophrenia presented to outside hospital with c/o urinary retention n/v and weakness x 4 days.  Found to have acute on chronic renal failure with SCr >7 and hyponatremia (Na 115).  Tx to [facility] for further eval. Currently c/o lightheadedness, weakness, malaise.  Has had nausea/diarrhea x 3-4 days. Unable to urinate x 2-3 days. On initial insertion of catheter got 2443ml out. Denies SOB, chest pain, fever, hemoptysis.   PAST MEDICAL HISTORY :   has a past medical history of Hypertension; CHF (congestive heart failure); Arthritis; Hyperlipemia; Sleep apnea; Asthma; Schizophrenia; Hypothyroidism; Bipolar 1 disorder; Ejection fraction; Venous insufficiency; Lithium toxicity; Edema; Obesity; Statin intolerance; Manic depression; Diabetes; and Peripheral neuropathy.  has past surgical history that includes Tonsillectomy. Prior to Admission medications   Medication Sig Start Date End Date Taking? Authorizing Provider  acetaminophen (TYLENOL) 500 MG tablet Take 1,000 mg by mouth every 6 (six) hours as needed for pain.    Historical Provider, MD  albuterol (PROVENTIL HFA;VENTOLIN HFA) 108 (90 BASE) MCG/ACT inhaler Inhale 2 puffs into the lungs every 4 (four) hours as needed for wheezing.    Historical Provider, MD  alprazolam Duanne Moron) 2 MG tablet  Take 2 mg by mouth 4 (four) times daily as needed for anxiety.     Historical Provider, MD  clomiPRAMINE (ANAFRANIL) 25 MG capsule Take 50 mg by mouth 2 (two) times daily.    Historical Provider, MD  diazepam (VALIUM) 10 MG tablet Take 10 mg by mouth at bedtime.  08/17/12   Historical Provider, MD  dicyclomine (BENTYL) 20 MG tablet Take 1 tablet by mouth 4 times daily. 09/23/11   Historical Provider, MD  esomeprazole (NEXIUM) 40 MG capsule Take 40 mg by mouth at bedtime.     Historical Provider, MD  fenofibrate 160 MG tablet Take 160 mg by mouth daily.    Historical Provider, MD  furosemide (LASIX) 80 MG tablet Take 40 mg by mouth 2 (two) times daily.     Historical Provider, MD  Gabapentin, PHN, (GRALISE) 600 MG TABS Take 1,800 mg by mouth 2 (two) times daily.     Historical Provider, MD  HYDROcodone-acetaminophen (NORCO) 10-325 MG per tablet Take 1 tablet by mouth every 6 (six) hours as needed for pain. For pain    Historical Provider, MD  lactulose (CHRONULAC) 10 GM/15ML solution Take 30 mLs by mouth 3 (three) times daily as needed (for constipation).  09/23/11   Historical Provider, MD  levothyroxine (SYNTHROID, LEVOTHROID) 100 MCG tablet Take 100 mcg by mouth every morning.     Historical Provider, MD  lisinopril (PRINIVIL,ZESTRIL) 10 MG tablet Take 10 mg by mouth daily.  08/20/12   Charlynne Cousins, MD  metoCLOPramide (REGLAN) 10 MG tablet Take 10 mg by mouth 4 (four) times daily as needed (for  stomach pain).  09/27/11   Historical Provider, MD  metoCLOPramide (REGLAN) 10 MG tablet Take 1 tablet (10 mg total) by mouth every 8 (eight) hours as needed (headache). 03/31/14   Everlene Balls, MD  morphine (MS CONTIN) 60 MG 12 hr tablet Take 60 mg by mouth daily.     Historical Provider, MD  Multiple Vitamins-Minerals (MULTIVITAMIN WITH MINERALS) tablet Take 1 tablet by mouth daily.    Historical Provider, MD  ondansetron (ZOFRAN) 4 MG tablet Take 4 mg by mouth every 8 (eight) hours as needed for nausea.   02/05/12   Historical Provider, MD  polyethylene glycol powder (GLYCOLAX/MIRALAX) powder Take 17 g by mouth daily as needed (for constipation).  02/05/12   Historical Provider, MD  spironolactone (ALDACTONE) 50 MG tablet Take 50 mg by mouth daily.  02/04/12   Historical Provider, MD  tiZANidine (ZANAFLEX) 4 MG tablet Take 4-8 mg by mouth every 8 (eight) hours as needed (for muscle spasm).  01/19/12   Historical Provider, MD  venlafaxine (EFFEXOR) 75 MG tablet Take 75 mg by mouth 2 (two) times daily.     Historical Provider, MD   Allergies  Allergen Reactions  . Aspirin Other (See Comments)    Causes asthmatic symptoms  . Statins     Joint pain   . Penicillins Rash    FAMILY HISTORY:  has no family status information on file.  SOCIAL HISTORY:  reports that he has been smoking Cigarettes.  He started smoking about 30 years ago. He has a 60 pack-year smoking history. He has never used smokeless tobacco. He reports that he does not drink alcohol or use illicit drugs.  REVIEW OF SYSTEMS:  As per HPI - All other systems reviewed and were neg.   SUBJECTIVE:   VITAL SIGNS: Temp:  [97.8 F (36.6 C)] 97.8 F (36.6 C) (12/20 2333) HEMODYNAMICS:   VENTILATOR SETTINGS:   INTAKE / OUTPUT: No intake or output data in the 24 hours ending 05/21/14 2347  PHYSICAL EXAMINATION: General:  Chronically ill appearing male, NAD  Neuro:  Awake, alert, appropriate, MAE  HEENT:  Mm moist, no JVD  Cardiovascular:  s1s2 rrr  Lungs:  resps even, non labored on RA, scattered rhonchi  Abdomen:  Obese, soft, +bs  Musculoskeletal:  Warm and dry, 1+ chronic BLE edema, bilat inner thighs erythematous   LABS:  CBC No results for input(s): WBC, HGB, HCT, PLT in the last 168 hours. Coag's No results for input(s): APTT, INR in the last 168 hours. BMET No results for input(s): NA, K, CL, CO2, BUN, CREATININE, GLUCOSE in the last 168 hours. Electrolytes No results for input(s): CALCIUM, MG, PHOS in the last 168  hours. Sepsis Markers No results for input(s): LATICACIDVEN, PROCALCITON, O2SATVEN in the last 168 hours. ABG No results for input(s): PHART, PCO2ART, PO2ART in the last 168 hours. Liver Enzymes No results for input(s): AST, ALT, ALKPHOS, BILITOT, ALBUMIN in the last 168 hours. Cardiac Enzymes No results for input(s): TROPONINI, PROBNP in the last 168 hours. Glucose No results for input(s): GLUCAP in the last 168 hours.  Imaging No results found.   ASSESSMENT / PLAN:  PULMONARY Hx asthma  OSA - non compliant with bipap P:   PRN BD  qhs bipap if he will tol  Supplemental O2 as needed  F/u CXR   CARDIOVASCULAR Hx HTN  Hypotension - mild.  Resolved with minimal volume  dHF - EF 50-55% 2012 Hyperlipidemia  P:  2D echo  Hold home anti-HTN  for now  Check bnp  Gentle volume   RENAL Acute on chronic renal failure - ?etiology - suspect r/t urinary retention and continued ACE-i, lasix, aldactone, etc. However-  2.4 L out on initial insertion of foley. unsure what baseline Scr.  Hx renal failure r/t lithium toxicity Hyperkalemia  Hyponatremia - Na 115.  Correct very slowly, likely somewhat chronic  P:   F/u chem now and q6 hours  Renal u/s r/t hydro- ?consider CT abd/pelvis instead given body habitus  Urine osm, Na, Cr  Gentle volume   GASTROINTESTINAL Morbid obesity  Nausea/vomiting  Diarrhea  P:   PPI  Clear liquids for now  PRN zofran    HEMATOLOGIC No active issue  P:  SQ heparin  F/u cbc   INFECTIOUS BLE cellulitis - has been on abx but not sure what rx or for how long.  P:   U/a  12/20>>> Doxycycline 12/20>>>  ENDOCRINE DM  Hypothyroid  P:   SSI  Synthroid  Check tsh   NEUROLOGIC Hx bipolar, schizophrenia  Polypharmacy  P:   PRN xanax 0.5 TID PRN (takes 2mg  po QID scheduled at home per records) Hold other psych meds for now   FAMILY  - Updates:  Pt updated at length, no family available.   - Inter-disciplinary family meet or  Palliative Care meeting due by: 12/27   Nickolas Madrid, NP 05/21/2014  11:47 PM Pager: [phone] or [phone]   Merton Border, MD ; [facility] service [place] [phone].  After 5:30 PM or weekends, call [phone]

## 2014-05-22 ENCOUNTER — Inpatient Hospital Stay (HOSPITAL_COMMUNITY): Payer: Medicare Other

## 2014-05-22 DIAGNOSIS — E875 Hyperkalemia: Secondary | ICD-10-CM

## 2014-05-22 DIAGNOSIS — N189 Chronic kidney disease, unspecified: Secondary | ICD-10-CM

## 2014-05-22 DIAGNOSIS — L0390 Cellulitis, unspecified: Secondary | ICD-10-CM | POA: Insufficient documentation

## 2014-05-22 DIAGNOSIS — N179 Acute kidney failure, unspecified: Secondary | ICD-10-CM | POA: Insufficient documentation

## 2014-05-22 DIAGNOSIS — G4733 Obstructive sleep apnea (adult) (pediatric): Secondary | ICD-10-CM | POA: Insufficient documentation

## 2014-05-22 LAB — BASIC METABOLIC PANEL
Anion gap: 23 — ABNORMAL HIGH (ref 5–15)
Anion gap: 24 — ABNORMAL HIGH (ref 5–15)
Anion gap: 24 — ABNORMAL HIGH (ref 5–15)
BUN: 98 mg/dL — AB (ref 6–23)
BUN: 105 mg/dL — AB (ref 6–23)
BUN: 106 mg/dL — AB (ref 6–23)
CHLORIDE: 76 meq/L — AB (ref 96–112)
CO2: 20 meq/L (ref 19–32)
CO2: 19 meq/L (ref 19–32)
CO2: 19 meq/L (ref 19–32)
Calcium: 14.6 mg/dL (ref 8.4–10.5)
Calcium: 12.9 mg/dL — ABNORMAL HIGH (ref 8.4–10.5)
Calcium: 12 mg/dL — ABNORMAL HIGH (ref 8.4–10.5)
Chloride: 78 mEq/L — ABNORMAL LOW (ref 96–112)
Chloride: 80 mEq/L — ABNORMAL LOW (ref 96–112)
Creatinine, Ser: 7.05 mg/dL — ABNORMAL HIGH (ref 0.50–1.35)
Creatinine, Ser: 6.77 mg/dL — ABNORMAL HIGH (ref 0.50–1.35)
Creatinine, Ser: 6.96 mg/dL — ABNORMAL HIGH (ref 0.50–1.35)
GFR calc Af Amer: 9 mL/min — ABNORMAL LOW (ref >90)
GFR calc Af Amer: 10 mL/min — ABNORMAL LOW (ref >90)
GFR calc Af Amer: 10 mL/min — ABNORMAL LOW (ref >90)
GFR calc non Af Amer: 8 mL/min — ABNORMAL LOW (ref >90)
GFR, EST NON AFRICAN AMERICAN: 9 mL/min — AB (ref >90)
GFR, EST NON AFRICAN AMERICAN: 8 mL/min — AB (ref >90)
GLUCOSE: 114 mg/dL — AB (ref 70–99)
Glucose, Bld: 109 mg/dL — ABNORMAL HIGH (ref 70–99)
Glucose, Bld: 95 mg/dL (ref 70–99)
POTASSIUM: 5.6 meq/L — AB (ref 3.7–5.3)
Potassium: 5.9 mEq/L — ABNORMAL HIGH (ref 3.7–5.3)
Potassium: 5.7 mEq/L — ABNORMAL HIGH (ref 3.7–5.3)
Sodium: 119 mEq/L — CL (ref 137–147)
Sodium: 121 mEq/L — CL (ref 137–147)
Sodium: 123 mEq/L — ABNORMAL LOW (ref 137–147)

## 2014-05-22 LAB — COMPREHENSIVE METABOLIC PANEL
ALT: 17 U/L (ref 0–53)
AST: 19 U/L (ref 0–37)
Albumin: 4.5 g/dL (ref 3.5–5.2)
Alkaline Phosphatase: 49 U/L (ref 39–117)
Anion gap: 24 — ABNORMAL HIGH (ref 5–15)
BUN: 98 mg/dL — AB (ref 6–23)
CHLORIDE: 75 meq/L — AB (ref 96–112)
CO2: 19 meq/L (ref 19–32)
CREATININE: 7.08 mg/dL — AB (ref 0.50–1.35)
Calcium: 14.4 mg/dL (ref 8.4–10.5)
GFR calc Af Amer: 9 mL/min — ABNORMAL LOW (ref >90)
GFR calc non Af Amer: 8 mL/min — ABNORMAL LOW (ref >90)
Glucose, Bld: 137 mg/dL — ABNORMAL HIGH (ref 70–99)
Potassium: 5.6 mEq/L — ABNORMAL HIGH (ref 3.7–5.3)
Sodium: 118 mEq/L — CL (ref 137–147)
Total Bilirubin: 0.3 mg/dL (ref 0.3–1.2)
Total Protein: 8.8 g/dL — ABNORMAL HIGH (ref 6.0–8.3)

## 2014-05-22 LAB — URINALYSIS, ROUTINE W REFLEX MICROSCOPIC
Bilirubin Urine: NEGATIVE
GLUCOSE, UA: 250 mg/dL — AB
Ketones, ur: NEGATIVE mg/dL
Leukocytes, UA: NEGATIVE
Nitrite: NEGATIVE
Protein, ur: 100 mg/dL — AB
Specific Gravity, Urine: 1.013 (ref 1.005–1.030)
UROBILINOGEN UA: 0.2 mg/dL (ref 0.0–1.0)
pH: 6 (ref 5.0–8.0)

## 2014-05-22 LAB — MAGNESIUM: MAGNESIUM: 2.6 mg/dL — AB (ref 1.5–2.5)

## 2014-05-22 LAB — PRO B NATRIURETIC PEPTIDE: Pro B Natriuretic peptide (BNP): 202.3 pg/mL — ABNORMAL HIGH (ref 0–125)

## 2014-05-22 LAB — URINE MICROSCOPIC-ADD ON

## 2014-05-22 LAB — GLUCOSE, CAPILLARY
GLUCOSE-CAPILLARY: 126 mg/dL — AB (ref 70–99)
GLUCOSE-CAPILLARY: 109 mg/dL — AB (ref 70–99)
Glucose-Capillary: 139 mg/dL — ABNORMAL HIGH (ref 70–99)
Glucose-Capillary: 111 mg/dL — ABNORMAL HIGH (ref 70–99)
Glucose-Capillary: 107 mg/dL — ABNORMAL HIGH (ref 70–99)
Glucose-Capillary: 95 mg/dL (ref 70–99)

## 2014-05-22 LAB — CBC
HEMATOCRIT: 39.9 % (ref 39.0–52.0)
HEMOGLOBIN: 13.2 g/dL (ref 13.0–17.0)
MCH: 27.1 pg (ref 26.0–34.0)
MCHC: 33.1 g/dL (ref 30.0–36.0)
MCV: 81.9 fL (ref 78.0–100.0)
PLATELETS: 461 10*3/uL — AB (ref 150–400)
RBC: 4.87 MIL/uL (ref 4.22–5.81)
RDW: 14.5 % (ref 11.5–15.5)
WBC: 15.5 10*3/uL — AB (ref 4.0–10.5)

## 2014-05-22 LAB — AMYLASE: Amylase: 66 U/L (ref 0–105)

## 2014-05-22 LAB — SODIUM, URINE, RANDOM: Sodium, Ur: 45 mEq/L

## 2014-05-22 LAB — HEMOGLOBIN A1C
Hgb A1c MFr Bld: 9.4 % — ABNORMAL HIGH (ref <5.7)
Mean Plasma Glucose: 223 mg/dL — ABNORMAL HIGH (ref <117)

## 2014-05-22 LAB — OSMOLALITY, URINE: OSMOLALITY UR: 321 mosm/kg — AB (ref 390–1090)

## 2014-05-22 LAB — LIPASE, BLOOD: Lipase: 55 U/L (ref 11–59)

## 2014-05-22 LAB — TSH: TSH: 1.89 u[IU]/mL (ref 0.350–4.500)

## 2014-05-22 LAB — LACTIC ACID, PLASMA: Lactic Acid, Venous: 1 mmol/L (ref 0.5–2.2)

## 2014-05-22 LAB — CREATININE, URINE, RANDOM: Creatinine, Urine: 46.24 mg/dL

## 2014-05-22 LAB — MRSA PCR SCREENING: MRSA by PCR: NEGATIVE

## 2014-05-22 LAB — PHOSPHORUS: Phosphorus: 11.2 mg/dL — ABNORMAL HIGH (ref 2.3–4.6)

## 2014-05-22 LAB — PROCALCITONIN: Procalcitonin: 0.9 ng/mL

## 2014-05-22 MED ORDER — SODIUM CHLORIDE 0.9 % IV BOLUS (SEPSIS)
500.0000 mL | Freq: Once | INTRAVENOUS | Status: AC
  Administered 2014-05-22: 500 mL via INTRAVENOUS

## 2014-05-22 MED ORDER — POLYETHYLENE GLYCOL 3350 17 GM/SCOOP PO POWD
17.0000 g | Freq: Every day | ORAL | Status: DC | PRN
  Filled 2014-05-22: qty 255

## 2014-05-22 MED ORDER — HYDROCODONE-ACETAMINOPHEN 10-325 MG PO TABS
1.0000 | ORAL_TABLET | Freq: Four times a day (QID) | ORAL | Status: DC | PRN
  Administered 2014-05-23 – 2014-05-27 (×5): 1 via ORAL
  Filled 2014-05-22 (×5): qty 1

## 2014-05-22 MED ORDER — FENOFIBRATE 160 MG PO TABS
160.0000 mg | ORAL_TABLET | Freq: Every day | ORAL | Status: DC
  Administered 2014-05-22 – 2014-05-27 (×6): 160 mg via ORAL
  Filled 2014-05-22 (×7): qty 1

## 2014-05-22 MED ORDER — PANTOPRAZOLE SODIUM 40 MG PO TBEC
40.0000 mg | DELAYED_RELEASE_TABLET | Freq: Every day | ORAL | Status: DC
  Administered 2014-05-22 – 2014-05-23 (×2): 40 mg via ORAL
  Filled 2014-05-22 (×2): qty 1

## 2014-05-22 MED ORDER — SODIUM CHLORIDE 0.9 % IV BOLUS (SEPSIS)
1000.0000 mL | Freq: Once | INTRAVENOUS | Status: AC
  Administered 2014-05-22: 1000 mL via INTRAVENOUS

## 2014-05-22 MED ORDER — SODIUM POLYSTYRENE SULFONATE 15 GM/60ML PO SUSP
60.0000 g | Freq: Once | ORAL | Status: AC
  Administered 2014-05-22: 60 g via ORAL
  Filled 2014-05-22: qty 240

## 2014-05-22 MED ORDER — POLYETHYLENE GLYCOL 3350 17 G PO PACK
17.0000 g | PACK | Freq: Every day | ORAL | Status: DC | PRN
  Filled 2014-05-22: qty 1

## 2014-05-22 MED ORDER — VENLAFAXINE HCL 75 MG PO TABS
75.0000 mg | ORAL_TABLET | Freq: Two times a day (BID) | ORAL | Status: DC
  Administered 2014-05-22 – 2014-05-27 (×11): 75 mg via ORAL
  Filled 2014-05-22 (×15): qty 1

## 2014-05-22 MED ORDER — MORPHINE SULFATE ER 30 MG PO TBCR
30.0000 mg | EXTENDED_RELEASE_TABLET | Freq: Two times a day (BID) | ORAL | Status: DC
  Filled 2014-05-22 (×2): qty 1

## 2014-05-22 NOTE — Progress Notes (Signed)
CRITICAL VALUE ALERT  Critical value received:  Na+ 121  Date of notification:  05/22/2014  Time of notification:  1200  Critical value read back:Yes  Nurse who received alert:  [place]   MD notified (1st page): Dr. Alva Garnet   Time of first page:  1200  Responding MD: Dr. Alva Garnet   Time MD responded: 1200

## 2014-05-22 NOTE — Progress Notes (Signed)
CRITICAL VALUE ALERT  Critical value received: Na 118, Calcium 14.4  Date of notification:  05/22/2014  Time of notification:  1:52 AM  Critical value read back: yes  Nurse who received alert: Maryelizabeth Rowan, RN  MD notified (1st page):  PCCM Dr. Jimmy Footman     Time of first page:  1:53 AM

## 2014-05-22 NOTE — Progress Notes (Signed)
Nutrition Brief Consult Note   RD consulted for nutrition education regarding weight loss.  Body mass index is 55.69 kg/(m^2). Pt meets criteria for class 3, extreme/morbid obesity based on current BMI.  RD provided "Weight Loss Tips" handout from the Academy of [employer]etics for wife to review. Patient is not alert enough for diet education. Needs OP diet education for weight loss.   Molli Barrows, RD, LDN, [place] Pager [phone] After Hours Pager [phone]

## 2014-05-22 NOTE — Progress Notes (Signed)
[facility] Progress Note Patient Name: [name] DOB: 1966/03/11 MRN: [id number]   Date of Service  05/22/2014  HPI/Events of Note  Hypotension  eICU Interventions  NS bolus given     Intervention Category Major Interventions: Hypotension - evaluation and management  Asencion Noble 05/22/2014, 9:44 PM

## 2014-05-22 NOTE — Progress Notes (Signed)
PULMONARY / CRITICAL CARE MEDICINE   Name: [name] MRN: [id number] DOB: [birth year]    ADMISSION DATE:  05/21/2014  REFERRING MD :  OSH  CHIEF COMPLAINT: urinary retention  INITIAL PRESENTATION:  48yo morbidly obese male with hx HTN, OSA, DM, bipolar, schizophrenia presented to outside hospital with c/o urinary retention n/v and weakness x 4 days.  Found to have acute on chronic renal failure with SCr >7 and hyponatremia (Na 115).  2.4L out on initial insertion of foley. Tx to [facility] for further eval.    STUDIES:  12/21 TTE:   SIGNIFICANT EVENTS:      SUBJECTIVE:  No distress. Cognition intact. No new complaints  VITAL SIGNS: Temp:  [97.3 F (36.3 C)-98.1 F (36.7 C)] 97.3 F (36.3 C) (12/21 1559) Pulse Rate:  [60-117] 81 (12/21 1630) Resp:  [9-25] 13 (12/21 1630) BP: (65-131)/(25-86) 128/56 mmHg (12/21 1630) SpO2:  [87 %-100 %] 97 % (12/21 1630) FiO2 (%):  [40 %] 40 % (12/21 0600) Weight:  [191.418 kg (422 lb)] 191.418 kg (422 lb) (12/21 0328) HEMODYNAMICS:   VENTILATOR SETTINGS: Vent Mode:  [-]  FiO2 (%):  [40 %] 40 % INTAKE / OUTPUT:  Intake/Output Summary (Last 24 hours) at 05/22/14 1700 Last data filed at 05/22/14 1600  Gross per 24 hour  Intake   2300 ml  Output   1665 ml  Net    635 ml    PHYSICAL EXAMINATION: General:  NAD, morbidly obese Neuro: No focal deficits HEENT: WNL  Cardiovascular: RRR s M Lungs: ckear Abdomen:  Obese, soft, +bs  Ext: trace symmetric edema, chronic stasis changes  LABS: I have reviewed all of today's lab results. Relevant abnormalities are discussed in the A/P section   ASSESSMENT / PLAN:  PULMONARY Hx asthma  OSA - non compliant with bipap P:   Cont PRN BD  Cont nocturnal BiPAP if he will comply Supplemental O2 as needed  Follow CXR intermittently  CARDIOVASCULAR Hx HTN  Hypotension - mild.  Responsive to fluids dHF - EF 50-55% 2012, pro BNP 202 on 12/21 Hyperlipidemia  P:  TTE pending Cont  holding home anti-HTN for now    RENAL Acute on chronic renal failure suspect obstructive uropathy retention Chronic ACEI therapy Hyperkalemia  Hyponatremia P:   Monitor BMET intermittently Monitor I/Os Correct electrolytes as indicated Kayexalate ordered 12/21 Recheck BMET PM 12/21 Consider Renal consult if not improving 12/22  GASTROINTESTINAL Morbid obesity  Nausea/vomiting, resolved Chronic PPI therapy P:   Cont PPI Advance diet as tolerated  HEMATOLOGIC No active issue  P:  DVT px: SQ heparin Monitor CBC intermittently Transfuse per usual ICU guidelines  INFECTIOUS Recent cellulitis - clinically resolved  P:   DC doxycycline  ENDOCRINE DM  Hypothyroidism, repleted DM 2 P:   Cont L-thyroxine Cont SSI  NEUROLOGIC Hx bipolar, schizophrenia  Polypharmacy  Chronic debilitation due to obesity P:   Cont PRN alprazoloam Holding other psych meds for now    He may be safely transferred to telemetry @ this point. Discussed with Dr Thereasa Solo. TRH will assume his care as of AM 12/22 and PCCM will sign off   Merton Border, MD ; [facility] [phone].  After 5:30 PM or weekends, call [phone]

## 2014-05-22 NOTE — Progress Notes (Signed)
RT Note:  Placed patient on BIPAP via FFM, auto titrate settings with 4 lpm O2 bleed in.  Patient tolerating well at this time.

## 2014-05-22 NOTE — Progress Notes (Addendum)
Pt BP trending down in 82/33 (45), 86/38 (49) at 2130. Notified MD Joya Gaskins. Ordered a 500 cc bolus of 0.9% NS. Will continue to monitor and assess.

## 2014-05-22 NOTE — Progress Notes (Signed)
UR Completed.  [name] Jane [phone]  

## 2014-05-23 LAB — GLUCOSE, CAPILLARY
GLUCOSE-CAPILLARY: 97 mg/dL (ref 70–99)
GLUCOSE-CAPILLARY: 122 mg/dL — AB (ref 70–99)
Glucose-Capillary: 112 mg/dL — ABNORMAL HIGH (ref 70–99)
Glucose-Capillary: 122 mg/dL — ABNORMAL HIGH (ref 70–99)
Glucose-Capillary: 122 mg/dL — ABNORMAL HIGH (ref 70–99)
Glucose-Capillary: 127 mg/dL — ABNORMAL HIGH (ref 70–99)

## 2014-05-23 LAB — CBC
HEMATOCRIT: 37.2 % — AB (ref 39.0–52.0)
Hemoglobin: 12 g/dL — ABNORMAL LOW (ref 13.0–17.0)
MCH: 26.8 pg (ref 26.0–34.0)
MCHC: 32.3 g/dL (ref 30.0–36.0)
MCV: 83 fL (ref 78.0–100.0)
PLATELETS: 443 10*3/uL — AB (ref 150–400)
RBC: 4.48 MIL/uL (ref 4.22–5.81)
RDW: 14.9 % (ref 11.5–15.5)
WBC: 10.7 10*3/uL — ABNORMAL HIGH (ref 4.0–10.5)

## 2014-05-23 LAB — BASIC METABOLIC PANEL
Anion gap: 16 — ABNORMAL HIGH (ref 5–15)
BUN: 112 mg/dL — AB (ref 6–23)
CHLORIDE: 92 meq/L — AB (ref 96–112)
CO2: 18 mmol/L — ABNORMAL LOW (ref 19–32)
CREATININE: 6.51 mg/dL — AB (ref 0.50–1.35)
Calcium: 9.6 mg/dL (ref 8.4–10.5)
GFR calc Af Amer: 10 mL/min — ABNORMAL LOW (ref >90)
GFR calc non Af Amer: 9 mL/min — ABNORMAL LOW (ref >90)
Glucose, Bld: 128 mg/dL — ABNORMAL HIGH (ref 70–99)
POTASSIUM: 4.6 mmol/L (ref 3.5–5.1)
Sodium: 126 mmol/L — ABNORMAL LOW (ref 135–145)

## 2014-05-23 LAB — CALCIUM, IONIZED: Calcium, Ion: 1.45 mmol/L — ABNORMAL HIGH (ref 1.12–1.23)

## 2014-05-23 LAB — PARATHYROID HORMONE, INTACT (NO CA): PTH: 10 pg/mL — ABNORMAL LOW (ref 14–64)

## 2014-05-23 MED ORDER — SODIUM POLYSTYRENE SULFONATE 15 GM/60ML PO SUSP
60.0000 g | Freq: Once | ORAL | Status: DC
  Filled 2014-05-23 (×2): qty 240

## 2014-05-23 MED ORDER — HALOPERIDOL LACTATE 5 MG/ML IJ SOLN
5.0000 mg | Freq: Once | INTRAMUSCULAR | Status: AC
  Administered 2014-05-23: 5 mg via INTRAVENOUS

## 2014-05-23 MED ORDER — GABAPENTIN 600 MG PO TABS
600.0000 mg | ORAL_TABLET | Freq: Two times a day (BID) | ORAL | Status: DC
  Administered 2014-05-23 – 2014-05-27 (×9): 600 mg via ORAL
  Filled 2014-05-23 (×11): qty 1

## 2014-05-23 MED ORDER — STERILE WATER FOR INJECTION IV SOLN
INTRAVENOUS | Status: DC
  Administered 2014-05-23 – 2014-05-27 (×6): via INTRAVENOUS
  Filled 2014-05-23 (×11): qty 850

## 2014-05-23 MED ORDER — SODIUM CHLORIDE 0.9 % IV BOLUS (SEPSIS): 1000.0000 mL | Freq: Once | INTRAVENOUS | Status: DC

## 2014-05-23 MED ORDER — SODIUM CHLORIDE 0.9 % IV BOLUS (SEPSIS)
500.0000 mL | Freq: Once | INTRAVENOUS | Status: AC
  Administered 2014-05-23: 500 mL via INTRAVENOUS

## 2014-05-23 MED ORDER — MORPHINE SULFATE ER 15 MG PO TBCR
30.0000 mg | EXTENDED_RELEASE_TABLET | Freq: Two times a day (BID) | ORAL | Status: DC
  Administered 2014-05-23 – 2014-05-24 (×2): 30 mg via ORAL
  Filled 2014-05-23 (×2): qty 2

## 2014-05-23 MED ORDER — HALOPERIDOL LACTATE 5 MG/ML IJ SOLN
INTRAMUSCULAR | Status: AC
  Filled 2014-05-23: qty 1

## 2014-05-23 NOTE — Progress Notes (Signed)
Pt took out peripheral IV, more agitated and confused, also BP 80/40 at 600. Notified MD Deterding. Ordered 5mg /ml of Haldol to decrease agitation and questioned reliability of BP, and to continue to monitor and assess. BP is 87/49. Did talk to wife of patient and said this behavior is typical in the hospital setting. She stated that "he becomes paranoid".

## 2014-05-23 NOTE — Consult Note (Signed)
48 yo morbidly obese male with hx HTN, OSA, DM, bipolar, schizophrenia presented to outside hospital with c/o urinary retention n/v and weakness x 4 days. Found to have renal failure SCr 7.08, K 5.6 and hyponatremia Na 118. Calcium was 14.4. 2400cc out on initial insertion of foley.  He was reportedly taking furosemide prior toTx to [facility] for further eval. He reports a history of urinary retention a couple of years ago requiring foley cath insertion.  He reports he was not seeing a doctor on a regular basis but was taking furosemide, spironolactone, an ACE-I and MVIs. Despite foley drainage and IVFs, creat has risen to 6.96 from 6.77 yesterday. No creat yet today UOP is excellent today(2000cc this shift) and clear in appearance.  He describes numbness and tingling in fingers and toes.  Past Medical History  Diagnosis Date  . Hypertension   . CHF (congestive heart failure)     Diastolic  . Arthritis   . Hyperlipemia   . Sleep apnea     He wore CPAP once  . Asthma   . Schizophrenia   . Hypothyroidism   . Bipolar 1 disorder   . Ejection fraction     EF 50-55%, March, 2012, technically difficult study, some RV dilatation  . Venous insufficiency   . Lithium toxicity     April, 2012  . Edema     Secondary to diastolic CHF  . Obesity   . Statin intolerance     Muscle aches from statins in the past  . Manic depression   . Diabetes   . Peripheral neuropathy    Past Surgical History  Procedure Laterality Date  . Tonsillectomy     Social History:  reports that he has been smoking Cigarettes.  He started smoking about 30 years ago. He has a 60 pack-year smoking history. He has never used smokeless tobacco. He reports that he does not drink alcohol or use illicit drugs. Allergies:  Allergies  Allergen Reactions  . Aspirin Other (See Comments)    Causes asthmatic symptoms  . Statins     Joint pain, muscle weakness, atropy   . Penicillins Rash   No family history on  file.  Medications:  Scheduled: . fenofibrate  160 mg Oral Daily  . gabapentin  600 mg Oral BID  . heparin  5,000 Units Subcutaneous 3 times per day  . insulin aspart  0-15 Units Subcutaneous TID WC  . levothyroxine  100 mcg Oral QAC breakfast  . morphine  30 mg Oral Q12H  . sodium chloride  1,000 mL Intravenous Once  . sodium polystyrene  60 g Oral Once  . venlafaxine  75 mg Oral BID   ROS: as per HPI otherwise difficult to ascertain  Blood pressure 93/43, pulse 88, temperature 98 F (36.7 C), temperature source Oral, resp. rate 13, height _0  (1.854 m), weight 192.779 kg (425 lb), SpO2 100 %.  General appearance: cooperative and slowed mentation Head: Normocephalic, without obvious abnormality, atraumatic short fat neck Eyes: negative Ears: normal TM's and external ear canals both ears Nose: Nares normal. Septum midline. Mucosa normal. No drainage or sinus tenderness. Throat: lips, mucosa, and tongue normal; teeth and gums normal Resp: clear to auscultation bilaterally Chest wall: no tenderness Cardio: regular rate and rhythm, S1, S2 normal, no murmur, click, rub or gallop GI: very large Extremities: edema 2+ Skin: Skin color, texture, turgor normal. No rashes or lesions Neurologic: Mental status: sl slow in reponses but clear Results for orders  placed or performed during the hospital encounter of 05/21/14 (from the past 48 hour(s))  MRSA PCR Screening     Status: None   Collection Time: 05/21/14 11:20 PM  Result Value Ref Range   MRSA by PCR NEGATIVE NEGATIVE    Comment:        The GeneXpert MRSA Assay (FDA approved for NASAL specimens only), is one component of a comprehensive MRSA colonization surveillance program. It is not intended to diagnose MRSA infection nor to guide or monitor treatment for MRSA infections.   Glucose, capillary     Status: Abnormal   Collection Time: 05/21/14 11:29 PM  Result Value Ref Range   Glucose-Capillary 139 (H) 70 - 99 mg/dL    Comment 1 Notify RN   Urinalysis, Routine w reflex microscopic     Status: Abnormal   Collection Time: 05/22/14 12:24 AM  Result Value Ref Range   Color, Urine YELLOW YELLOW   APPearance CLEAR CLEAR   Specific Gravity, Urine 1.013 1.005 - 1.030   pH 6.0 5.0 - 8.0   Glucose, UA 250 (A) NEGATIVE mg/dL   Hgb urine dipstick LARGE (A) NEGATIVE   Bilirubin Urine NEGATIVE NEGATIVE   Ketones, ur NEGATIVE NEGATIVE mg/dL   Protein, ur 100 (A) NEGATIVE mg/dL   Urobilinogen, UA 0.2 0.0 - 1.0 mg/dL   Nitrite NEGATIVE NEGATIVE   Leukocytes, UA NEGATIVE NEGATIVE  Osmolality, urine     Status: Abnormal   Collection Time: 05/22/14 12:24 AM  Result Value Ref Range   Osmolality, Ur 321 (L) 390 - 1090 mOsm/kg    Comment: Performed at [facility]  Sodium, urine, random     Status: None   Collection Time: 05/22/14 12:24 AM  Result Value Ref Range   Sodium, Ur 45 mEq/L  Creatinine, urine, random     Status: None   Collection Time: 05/22/14 12:24 AM  Result Value Ref Range   Creatinine, Urine 46.24 mg/dL  Urine microscopic-add on     Status: Abnormal   Collection Time: 05/22/14 12:24 AM  Result Value Ref Range   Squamous Epithelial / LPF RARE RARE   RBC / HPF 21-50 <3 RBC/hpf   Bacteria, UA RARE RARE   Casts HYALINE CASTS (A) NEGATIVE  CBC     Status: Abnormal   Collection Time: 05/22/14 12:38 AM  Result Value Ref Range   WBC 15.5 (H) 4.0 - 10.5 K/uL   RBC 4.87 4.22 - 5.81 MIL/uL   Hemoglobin 13.2 13.0 - 17.0 g/dL   HCT 39.9 39.0 - 52.0 %   MCV 81.9 78.0 - 100.0 fL   MCH 27.1 26.0 - 34.0 pg   MCHC 33.1 30.0 - 36.0 g/dL   RDW 14.5 11.5 - 15.5 %   Platelets 461 (H) 150 - 400 K/uL  Comprehensive metabolic panel     Status: Abnormal   Collection Time: 05/22/14 12:38 AM  Result Value Ref Range   Sodium 118 (LL) 137 - 147 mEq/L    Comment: CRITICAL RESULT CALLED TO, READ BACK BY AND VERIFIED WITH: T.WOODS RN [id number] 05/22/14 E.GADDY    Potassium 5.6 (H) 3.7 - 5.3 mEq/L   Chloride 75  (L) 96 - 112 mEq/L   CO2 19 19 - 32 mEq/L   Glucose, Bld 137 (H) 70 - 99 mg/dL   BUN 98 (H) 6 - 23 mg/dL   Creatinine, Ser 7.08 (H) 0.50 - 1.35 mg/dL   Calcium 14.4 (HH) 8.4 - 10.5 mg/dL    Comment: CRITICAL  RESULT CALLED TO, READ BACK BY AND VERIFIED WITH: T.WOODS RN [id number] 05/22/14 E.GADDY    Total Protein 8.8 (H) 6.0 - 8.3 g/dL   Albumin 4.5 3.5 - 5.2 g/dL   AST 19 0 - 37 U/L   ALT 17 0 - 53 U/L   Alkaline Phosphatase 49 39 - 117 U/L   Total Bilirubin 0.3 0.3 - 1.2 mg/dL   GFR calc non Af Amer 8 (L) >90 mL/min   GFR calc Af Amer 9 (L) >90 mL/min    Comment: (NOTE) The eGFR has been calculated using the CKD EPI equation. This calculation has not been validated in all clinical situations. eGFR's persistently <90 mL/min signify possible Chronic Kidney Disease.    Anion gap 24 (H) 5 - 15  Magnesium     Status: Abnormal   Collection Time: 05/22/14 12:38 AM  Result Value Ref Range   Magnesium 2.6 (H) 1.5 - 2.5 mg/dL  Phosphorus     Status: Abnormal   Collection Time: 05/22/14 12:38 AM  Result Value Ref Range   Phosphorus 11.2 (H) 2.3 - 4.6 mg/dL  Lactic acid, plasma     Status: None   Collection Time: 05/22/14 12:38 AM  Result Value Ref Range   Lactic Acid, Venous 1.0 0.5 - 2.2 mmol/L  Procalcitonin     Status: None   Collection Time: 05/22/14 12:38 AM  Result Value Ref Range   Procalcitonin 0.90 ng/mL    Comment:        Interpretation: PCT > 0.5 ng/mL and <= 2 ng/mL: Systemic infection (sepsis) is possible, but other conditions are known to elevate PCT as well. (NOTE)         ICU PCT Algorithm               Non ICU PCT Algorithm    ----------------------------     ------------------------------         PCT < 0.25 ng/mL                 PCT < 0.1 ng/mL     Stopping of antibiotics            Stopping of antibiotics       strongly encouraged.               strongly encouraged.    ----------------------------     ------------------------------       PCT level decrease by                PCT < 0.25 ng/mL       >= 80% from peak PCT       OR PCT 0.25 - 0.5 ng/mL          Stopping of antibiotics                                             encouraged.     Stopping of antibiotics           encouraged.    ----------------------------     ------------------------------       PCT level decrease by              PCT >= 0.25 ng/mL       < 80% from peak PCT        AND PCT >= 0.5 ng/mL  Continuing antibiotics                                              encouraged.       Continuing antibiotics            encouraged.    ----------------------------     ------------------------------     PCT level increase compared          PCT > 0.5 ng/mL         with peak PCT AND          PCT >= 0.5 ng/mL             Escalation of antibiotics                                          strongly encouraged.      Escalation of antibiotics        strongly encouraged.   Pro b natriuretic peptide (BNP)     Status: Abnormal   Collection Time: 05/22/14 12:38 AM  Result Value Ref Range   Pro B Natriuretic peptide (BNP) 202.3 (H) 0 - 125 pg/mL  Amylase     Status: None   Collection Time: 05/22/14 12:38 AM  Result Value Ref Range   Amylase 66 0 - 105 U/L  Lipase, blood     Status: None   Collection Time: 05/22/14 12:38 AM  Result Value Ref Range   Lipase 55 11 - 59 U/L  Hemoglobin A1c     Status: Abnormal   Collection Time: 05/22/14 12:38 AM  Result Value Ref Range   Hgb A1c MFr Bld 9.4 (H) <5.7 %    Comment: (NOTE)                                                                       According to the ADA Clinical Practice Recommendations for 2011, when HbA1c is used as a screening test:  >=6.5%   Diagnostic of Diabetes Mellitus           (if abnormal result is confirmed) 5.7-6.4%   Increased risk of developing Diabetes Mellitus References:Diagnosis and Classification of Diabetes Mellitus,Diabetes MEQA,8341,96(QIWLN 1):S62-S69 and Standards of Medical Care in          Diabetes - 2011,Diabetes Care,2011,34 (Suppl 1):S11-S61.    Mean Plasma Glucose 223 (H) <117 mg/dL    Comment: Performed at [facility]  IFE, Urine (with Tot Prot)     Status: Abnormal   Collection Time: 05/22/14  2:39 AM  Result Value Ref Range   Time RANDOM hours    Comment: CORRECTED ON 12/22 AT 1243: PREVIOUSLY REPORTED AS URINE, RANDOM, CORRECTED ON 12/21 AT 0242: PREVIOUSLY REPORTED AS 24   Volume, Urine RANDOM mL    Comment: CORRECTED ON 12/22 AT 1243: PREVIOUSLY REPORTED AS URINE, RANDOM   Total Protein, Urine 146 (H) 5 - 25 mg/dL    Comment: No established reference range.   Total Protein, Urine-Ur/day NOT CALC <150 mg/day  Comment: (NOTE) Total urinary protein is determined by adding the albumin and Kappa and/or Lambda light chains.  This value may not agree with the total protein as determined by chemical methods, which characteristically underestimate urinary light chains. Performed at [facility]    Albumin, U PENDING    Alpha 1, Urine PENDING    Alpha 2, Urine PENDING    Beta, Urine PENDING    Gamma Globulin, Urine PENDING    Immunofixation, Urine PENDING   TSH     Status: None   Collection Time: 05/22/14  2:48 AM  Result Value Ref Range   TSH 1.890 0.350 - 4.500 uIU/mL  Basic metabolic panel     Status: Abnormal   Collection Time: 05/22/14  2:48 AM  Result Value Ref Range   Sodium 119 (LL) 137 - 147 mEq/L    Comment: CRITICAL RESULT CALLED TO, READ BACK BY AND VERIFIED WITH: C.HAYES RN [id number] 05/22/14 E.GADDY     Potassium 5.6 (H) 3.7 - 5.3 mEq/L   Chloride 76 (L) 96 - 112 mEq/L   CO2 20 19 - 32 mEq/L   Glucose, Bld 114 (H) 70 - 99 mg/dL   BUN 98 (H) 6 - 23 mg/dL   Creatinine, Ser 7.05 (H) 0.50 - 1.35 mg/dL   Calcium 14.6 (HH) 8.4 - 10.5 mg/dL    Comment: CRITICAL RESULT CALLED TO, READ BACK BY AND VERIFIED WITH: C.HAYES RN [phone] 05/22/14 E.GADDY    GFR calc non Af Amer 8 (L) >90 mL/min   GFR calc Af Amer 9 (L) >90 mL/min    Comment:  (NOTE) The eGFR has been calculated using the CKD EPI equation. This calculation has not been validated in all clinical situations. eGFR's persistently <90 mL/min signify possible Chronic Kidney Disease.    Anion gap 23 (H) 5 - 15  Parathyroid hormone, intact (no Ca)     Status: Abnormal   Collection Time: 05/22/14  2:48 AM  Result Value Ref Range   PTH 10 (L) 14 - 64 pg/mL    Comment: (NOTE) Interpretive Guide:                             Intact PTH               Calcium                             ----------               ------- Normal Parathyroid           Normal                   Normal Hypoparathyroidism           Low or Low Normal        Low Hyperparathyroidism     Primary                 Normal or High           High     Secondary               High                     Normal or Low     Tertiary                High  High Non-Parathyroid  Hypercalcemia              Low or Low Normal        High **Please note change in methodology. If re-baselining is needed, for patients who are being serially tested, please call customer service, within 3 days of collection, to request to add on the appropriate re-baselining test code for the previous methodology, at no charge.** Performed at [facility]   Glucose, capillary     Status: Abnormal   Collection Time: 05/22/14  4:17 AM  Result Value Ref Range   Glucose-Capillary 126 (H) 70 - 99 mg/dL   Comment 1 Notify RN   Glucose, capillary     Status: Abnormal   Collection Time: 05/22/14  8:40 AM  Result Value Ref Range   Glucose-Capillary 111 (H) 70 - 99 mg/dL  Basic metabolic panel     Status: Abnormal   Collection Time: 05/22/14 11:49 AM  Result Value Ref Range   Sodium 121 (LL) 137 - 147 mEq/L    Comment: CRITICAL RESULT CALLED TO, READ BACK BY AND VERIFIED WITH: C.KELLEY,RN 05/22/14 1239 BY BSLADE    Potassium 5.9 (H) 3.7 - 5.3 mEq/L   Chloride 78 (L) 96 - 112 mEq/L   CO2 19 19 - 32 mEq/L    Glucose, Bld 109 (H) 70 - 99 mg/dL   BUN 105 (H) 6 - 23 mg/dL   Creatinine, Ser 6.77 (H) 0.50 - 1.35 mg/dL   Calcium 12.9 (H) 8.4 - 10.5 mg/dL   GFR calc non Af Amer 9 (L) >90 mL/min   GFR calc Af Amer 10 (L) >90 mL/min    Comment: (NOTE) The eGFR has been calculated using the CKD EPI equation. This calculation has not been validated in all clinical situations. eGFR's persistently <90 mL/min signify possible Chronic Kidney Disease.    Anion gap 24 (H) 5 - 15  Glucose, capillary     Status: Abnormal   Collection Time: 05/22/14 12:05 PM  Result Value Ref Range   Glucose-Capillary 109 (H) 70 - 99 mg/dL  Glucose, capillary     Status: Abnormal   Collection Time: 05/22/14  3:57 PM  Result Value Ref Range   Glucose-Capillary 107 (H) 70 - 99 mg/dL  Basic metabolic panel     Status: Abnormal   Collection Time: 05/22/14  6:08 PM  Result Value Ref Range   Sodium 123 (L) 137 - 147 mEq/L   Potassium 5.7 (H) 3.7 - 5.3 mEq/L   Chloride 80 (L) 96 - 112 mEq/L   CO2 19 19 - 32 mEq/L   Glucose, Bld 95 70 - 99 mg/dL   BUN 106 (H) 6 - 23 mg/dL   Creatinine, Ser 6.96 (H) 0.50 - 1.35 mg/dL   Calcium 12.0 (H) 8.4 - 10.5 mg/dL   GFR calc non Af Amer 8 (L) >90 mL/min   GFR calc Af Amer 10 (L) >90 mL/min    Comment: (NOTE) The eGFR has been calculated using the CKD EPI equation. This calculation has not been validated in all clinical situations. eGFR's persistently <90 mL/min signify possible Chronic Kidney Disease.    Anion gap 24 (H) 5 - 15  Glucose, capillary     Status: None   Collection Time: 05/22/14  7:26 PM  Result Value Ref Range   Glucose-Capillary 95 70 - 99 mg/dL  Glucose, capillary     Status: Abnormal   Collection Time: 05/23/14 12:47 AM  Result Value Ref Range  Glucose-Capillary 112 (H) 70 - 99 mg/dL  CBC     Status: Abnormal   Collection Time: 05/23/14  2:52 AM  Result Value Ref Range   WBC 10.7 (H) 4.0 - 10.5 K/uL   RBC 4.48 4.22 - 5.81 MIL/uL   Hemoglobin 12.0 (L) 13.0  - 17.0 g/dL   HCT 37.2 (L) 39.0 - 52.0 %   MCV 83.0 78.0 - 100.0 fL   MCH 26.8 26.0 - 34.0 pg   MCHC 32.3 30.0 - 36.0 g/dL   RDW 14.9 11.5 - 15.5 %   Platelets 443 (H) 150 - 400 K/uL  Calcium, ionized     Status: Abnormal   Collection Time: 05/23/14  2:52 AM  Result Value Ref Range   Calcium, Ion 1.45 (H) 1.12 - 1.23 mmol/L    Comment: Performed at [facility]  Glucose, capillary     Status: None   Collection Time: 05/23/14  6:19 AM  Result Value Ref Range   Glucose-Capillary 97 70 - 99 mg/dL  Glucose, capillary     Status: Abnormal   Collection Time: 05/23/14  7:52 AM  Result Value Ref Range   Glucose-Capillary 122 (H) 70 - 99 mg/dL  Glucose, capillary     Status: Abnormal   Collection Time: 05/23/14 11:54 AM  Result Value Ref Range   Glucose-Capillary 122 (H) 70 - 99 mg/dL   US Renal Port  05/22/2014   CLINICAL DATA:  Renal failure.  EXAM: RENAL/URINARY TRACT ULTRASOUND COMPLETE  COMPARISON:  CT abdomen and pelvis and renal ultrasound 08/02/2012  FINDINGS: Examination limited by patient body habitus.  Right Kidney:  Length: 12.1 cm. Echogenicity within normal limits. No mass or hydronephrosis visualized.  Left Kidney:  Length: 13.3 cm. Echogenicity within normal limits. No mass or hydronephrosis visualized.  Bladder:  Not visualized. A Foley catheter was in place but not visualized sonographically.  IMPRESSION: No hydronephrosis.   Electronically Signed   By: Logan Bores   On: 05/22/2014 10:24   Dg Chest Port 1 View  05/22/2014   CLINICAL DATA:  Hypoxia  EXAM: PORTABLE CHEST - 1 VIEW  COMPARISON:  Chest x-ray of September 12, 2013  FINDINGS: The lungs are well-expanded. There is no focal infiltrate. The lung markings are coarse in the right infrahilar region. The heart and pulmonary vascularity are normal. The trachea is midline. There is no pleural effusion.  IMPRESSION: COPD . Infrahilar subsegmental atelectasis on the right is suspected. There is no pneumonia nor evidence of  CHF.   Electronically Signed   By: David  Martinique   On: 05/22/2014 07:05    Assessment:  1 AKI due to obstructive uropathy due to neurogenic bladder;r/o prostatic disease 2 Post obstructive diuresis 3 Morbid obesity 4 Hyponatremia due to inability to excrete free water load due to renal failure 5 Hypercalcemia 6 Hyperkalemia Plan: 1 Increase IVF and add bicarb (200cc per hour) 2 PTH, vita D levels and SPEP 3 Cont drainage and will need Urology to see  [facility] C 05/23/2014, 3:19 PM

## 2014-05-23 NOTE — Progress Notes (Signed)
Pt was lowered to the floor today after his knees buckled per staff RN. Assigned RN was not present at time of lowering, but did come in to help get Pt to the bed afterwards. Pt is unharmed, with out injuries. Pt reports feeling dizzy and was dizzy prior to movement. Pts BP remains low as it was prior to him going back to bed. MD present at time. RN will continue to monitor.

## 2014-05-23 NOTE — Clinical Documentation Improvement (Signed)
Acute on chronic renal failure documented in current record.  Please specify the stage of CKD and document in your progress note and carry over to the discharge summary.   Also document any underlying cause of CKD such as diabetes or hypertension.    Possible CKD Stage: --stage 2 (mild) - GFR 60-89 --stage 3 (moderate) - GFR 30-59 --stage 4 (severe) - GFR 15-29 --stage 5- GFR < 15 --Unable to determine at present  Current renal labs: Component      BUN Creatinine  Latest Ref Rng      6 - 23 mg/dL 0.50 - 1.35 mg/dL  05/22/2014     12:38 AM 98 (H) 7.08 (H)  05/22/2014      2:48 AM 98 (H) 7.05 (H)  05/22/2014     11:49 AM 105 (H) 6.77 (H)  05/22/2014      6:08 PM 106 (H) 6.96 (H)   Component      GFR calc non Af Amer  Latest Ref Rng      >90 mL/min  05/22/2014     12:38 AM 8 (L)  05/22/2014      2:48 AM 8 (L)  05/22/2014     11:49 AM 9 (L)  05/22/2014      6:08 PM 8 (L)    Thank you, Mateo Flow, RN [phone] Clinical Documentation Specialist

## 2014-05-23 NOTE — Progress Notes (Signed)
PULMONARY / CRITICAL CARE MEDICINE   Name: [name] MRN: [id number] DOB: February 03, 1966    ADMISSION DATE:  05/21/2014  REFERRING MD :  OSH  CHIEF COMPLAINT: urinary retention  INITIAL PRESENTATION:  48yo morbidly obese male with hx HTN, OSA, DM, bipolar, schizophrenia presented to outside hospital with c/o urinary retention n/v and weakness x 4 days.  Found to have acute on chronic renal failure with SCr >7 and hyponatremia (Na 115).  2.4L out on initial insertion of foley. Tx to [facility] for further eval.    STUDIES:  12/21 TTE:  12/21 Renal US: no hydronephrosis  SIGNIFICANT EVENTS:   12/22 Renal consult requested   SUBJECTIVE:  Lethargic, no resp distress. Borderline low (?spurious) BPs  VITAL SIGNS: Temp:  [97.2 F (36.2 C)-98.1 F (36.7 C)] 98 F (36.7 C) (12/22 1213) Pulse Rate:  [25-88] 88 (12/22 1430) Resp:  [10-22] 13 (12/22 1430) BP: (58-128)/(33-81) 93/43 mmHg (12/22 1430) SpO2:  [83 %-100 %] 100 % (12/22 1430) Weight:  [192.779 kg (425 lb)] 192.779 kg (425 lb) (12/22 0426) HEMODYNAMICS:   VENTILATOR SETTINGS:   INTAKE / OUTPUT:  Intake/Output Summary (Last 24 hours) at 05/23/14 1447 Last data filed at 05/23/14 0400  Gross per 24 hour  Intake   1540 ml  Output   1605 ml  Net    -65 ml    PHYSICAL EXAMINATION: General:  NAD, morbidly obese Neuro: Very weak LEs HEENT: WNL  Cardiovascular: Reg, no M Lungs: clear anteriorly Abdomen:  Obese, soft, +bs  Ext: trace symmetric edema, chronic stasis changes  LABS: I have reviewed all of today's lab results. Relevant abnormalities are discussed in the A/P section  CXR: NNF  ASSESSMENT / PLAN:  PULMONARY Hx asthma  OSA P:   Cont PRN BDs Cont nocturnal BiPAP if he will comply Supplemental O2 as needed  Follow CXR intermittently  CARDIOVASCULAR Hx HTN  Hypotension - mild.  Responsive to fluids dHF - EF 50-55% 2012, pro BNP 202 on 12/21 Hyperlipidemia  P:  TTE pending Cont holding home  anti-HTN for now   RENAL Acute on chronic renal failure, nonoliguric suspect obstructive uropathy, resolved with Foley cath placement Chronic ACEI therapy Hyperkalemia  Hyponatremia P:   Monitor BMET intermittently Monitor I/Os Correct electrolytes as indicated Kayexalate re-ordered 12/22 Recheck BMET PM 12/22 Renal consult 12/22 - spoke with Dr Florene Glen  GASTROINTESTINAL Morbid obesity  Nausea/vomiting, resolved Chronic PPI therapy P:   Cont PPI Cont diet as tolerated  HEMATOLOGIC No active issue  P:  DVT px: SQ heparin Monitor CBC intermittently Transfuse per usual ICU guidelines  INFECTIOUS Recent cellulitis - clinically resolved  P:   Monitor off abx  ENDOCRINE DM  Chronic hypothyroidism DM 2, controlled presently P:   Cont L-thyroxine Cont SSI  NEUROLOGIC Hx bipolar, schizophrenia  Polypharmacy  Chronic debilitation due to obesity Chronic pain syndrome P:   Cont PRN alprazolam Cont MS contin @ reduced dose Resume gabapentin @ reduced dose Cont Effexor  He may be safely transferred to SDU but will leave on PCCM for now.  Merton Border, MD ; [facility] [phone].  After 5:30 PM or weekends, call [phone]

## 2014-05-23 NOTE — Evaluation (Signed)
Physical Therapy Evaluation Patient Details Name: [name] MRN: [id number] DOB: May 02, 1966 Today's Date: 05/23/2014   History of Present Illness  48 yo morbidly obese male with hx HTN, OSA, DM, bipolar, schizophrenia presented to outside hospital with c/o urinary retention n/v and weakness x 4 days.  Found to have acute on chronic renal failure.  Clinical Impression  Pt admitted with above diagnosis. Pt currently with functional limitations due to the deficits listed below (see PT Problem List). Presents with profound weakness today, requiring max assist +2 for bed mobility and only tolerated sitting edge of bed for 5 minutes with min guard- min assist for balance. Pt reports he lives in a "building" behind his house because he cannot ascend stairs into his home. States his wife has been performing ADLs. He was able to ambulate short distances PTA with a rollator but states he primarily lies in bed. High risk for falls and readmission. If pt is denied by CIR he will require SNF placement. Pt will benefit from skilled PT to increase their independence and safety with mobility to allow discharge to the venue listed below.       Follow Up Recommendations CIR    Equipment Recommendations   (TBD)    Recommendations for Other Services Rehab consult;OT consult     Precautions / Restrictions Precautions Precautions: Fall Precaution Comments: Per RN, pt had assisted fall to floor with staff 12/22. Reports pts knees buckled during tranfer back to bed. Restrictions Weight Bearing Restrictions: No      Mobility  Bed Mobility Overal bed mobility: Needs Assistance;+2 for physical assistance Bed Mobility: Rolling;Supine to Sit;Sit to Supine Rolling: Max assist;+2 for physical assistance   Supine to sit: Max assist;+2 for physical assistance;HOB elevated Sit to supine: Mod assist;+2 for physical assistance   General bed mobility comments: Max assist with VC for technique +2 assist for  rolling to Lt and right, and for support to achieve seated position at edge of bed.  mod assist +2 to return to supine position. Pt Needed LE support to bring feet off of bed, however independently lifted LEs back into bed at end of therapy session. Tolerated sitting EOB x5 minutes with interminttent min guard- min assist for balance.  Transfers                    Ambulation/Gait                Stairs            Wheelchair Mobility    Modified Rankin (Stroke Patients Only)       Balance Overall balance assessment: Needs assistance;History of Falls Sitting-balance support: Single extremity supported;Feet supported Sitting balance-Leahy Scale: Poor Sitting balance - Comments: Practiced sitting balance at edge of bed. Requiring min guard-min assist intermittently. VC for upright posture and to pull trunk into center position. Postural control: Right lateral lean;Posterior lean                                   Pertinent Vitals/Pain Pain Assessment: 0-10 Pain Score:  ("they just hurt") Pain Location: knees Pain Descriptors / Indicators: Aching Pain Intervention(s): Monitored during session;Repositioned  SpO2 94-98% on room air during therapy HR 88 BP 112/49    Home Living Family/patient expects to be discharged to:: Private residence Living Arrangements: Spouse/significant other Available Help at Discharge: Family;Available 24 hours/day Type of Home: House (lives in a "  building out back" behind his house.) Home Access: Stairs to enter;Other (comment) (no steps into his "building" that he stays in)   Entrance Stairs-Number of Steps: 12 Home Layout: One level Home Equipment: [place] - 4 wheels;Tub bench;Hospital bed      Prior Function Level of Independence: Needs assistance   Gait / Transfers Assistance Needed: States he does not ambulate much. Uses rollator for transfers to [facility].  ADL's / Homemaking Assistance Needed: Depends on wife to  perform ADLs.        Hand Dominance        Extremity/Trunk Assessment   Upper Extremity Assessment: Defer to OT evaluation           Lower Extremity Assessment: Generalized weakness         Communication   Communication: No difficulties  Cognition Arousal/Alertness: Lethargic Behavior During Therapy: WFL for tasks assessed/performed Overall Cognitive Status: No family/caregiver present to determine baseline cognitive functioning                      General Comments      Exercises General Exercises - Lower Extremity Long Arc Quad: Strengthening;Left;10 reps;Seated      Assessment/Plan    PT Assessment Patient needs continued PT services  PT Diagnosis Difficulty walking;Generalized weakness;Acute pain   PT Problem List Decreased strength;Decreased range of motion;Decreased activity tolerance;Decreased balance;Decreased mobility;Decreased knowledge of use of DME;Decreased safety awareness;Decreased knowledge of precautions;Obesity;Pain  PT Treatment Interventions DME instruction;Gait training;Functional mobility training;Therapeutic activities;Therapeutic exercise;Balance training;Neuromuscular re-education;Patient/family education;Wheelchair mobility training;Modalities   PT Goals (Current goals can be found in the Care Plan section) Acute Rehab PT Goals Patient Stated Goal: Go home PT Goal Formulation: With patient Time For Goal Achievement: 06/06/14 Potential to Achieve Goals: Fair    Frequency Min 3X/week   Barriers to discharge Inaccessible home environment;Decreased caregiver support Pt states he is living in a building behind his house, as he is unable to ascend stairs to enter his home. Sleeps in a recliner in his "building." only has a wife to care for him at home.    Co-evaluation               End of Session   Activity Tolerance: Patient limited by fatigue;Patient limited by lethargy Patient left: in bed;with call bell/phone within  reach Nurse Communication: Mobility status;Other (comment) (foley full- RN addressed during therapy)         Time: 5643-3295 PT Time Calculation (min) (ACUTE ONLY): 30 min   Charges:   PT Evaluation $Initial PT Evaluation Tier I: 1 Procedure PT Treatments $Therapeutic Activity: 8-22 mins   PT G CodesEllouise Newer 05/23/2014, 4:32 PM Camille Bal [place], [place]

## 2014-05-23 NOTE — Progress Notes (Signed)
RT Note:  Patient refused BIPAP. Vitals stable at this time.  Patient on 2 lpm [place].

## 2014-05-23 NOTE — Progress Notes (Signed)
  RD consulted for nutrition education regarding weight loss. Patient remains not alert enough for education. Spoke with patient's wife. She agreed to OP weight loss counseling.  Body mass index is 56.08 kg/(m^2). Pt meets criteria for class 3, extreme/morbid obesity based on current BMI.  RD provided "Weight Loss Tips" handout from the [employer] to patient's wife. Emphasized the importance of serving sizes and provided examples of correct portions of common foods. Discussed importance of controlled and consistent intake throughout the day. Provided examples of ways to balance meals/snacks and encouraged intake of high-fiber, whole grain complex carbohydrates. Emphasized the importance of hydration with calorie-free beverages and limiting sugar-sweetened beverages. Encouraged pt to discuss physical activity options with physician. Teach back method used.  Expect fair compliance. Needs follow-up as an outpatient.  Current diet order is CHO-modified. Labs and medications reviewed. No further nutrition interventions warranted at this time. RD contact information provided. If additional nutrition issues arise, please re-consult RD.   Molli Barrows, RD, LDN, [place] Pager [phone] After Hours Pager [phone]

## 2014-05-24 ENCOUNTER — Inpatient Hospital Stay (HOSPITAL_COMMUNITY): Payer: Medicare Other

## 2014-05-24 DIAGNOSIS — R404 Transient alteration of awareness: Secondary | ICD-10-CM

## 2014-05-24 LAB — RENAL FUNCTION PANEL
ALBUMIN: 3.7 g/dL (ref 3.5–5.2)
Anion gap: 14 (ref 5–15)
BUN: 74 mg/dL — ABNORMAL HIGH (ref 6–23)
CALCIUM: 9 mg/dL (ref 8.4–10.5)
CO2: 22 mmol/L (ref 19–32)
CREATININE: 3.35 mg/dL — AB (ref 0.50–1.35)
Chloride: 95 mEq/L — ABNORMAL LOW (ref 96–112)
GFR calc Af Amer: 23 mL/min — ABNORMAL LOW (ref >90)
GFR, EST NON AFRICAN AMERICAN: 20 mL/min — AB (ref >90)
Glucose, Bld: 106 mg/dL — ABNORMAL HIGH (ref 70–99)
Phosphorus: 3.7 mg/dL (ref 2.3–4.6)
Potassium: 3.3 mmol/L — ABNORMAL LOW (ref 3.5–5.1)
Sodium: 131 mmol/L — ABNORMAL LOW (ref 135–145)

## 2014-05-24 LAB — PROTEIN ELECTROPHORESIS, SERUM
Albumin ELP: 54 % — ABNORMAL LOW (ref 55.8–66.1)
Alpha-1-Globulin: 4.6 % (ref 2.9–4.9)
Alpha-2-Globulin: 12.6 % — ABNORMAL HIGH (ref 7.1–11.8)
Beta 2: 6.1 % (ref 3.2–6.5)
Beta Globulin: 8.5 % — ABNORMAL HIGH (ref 4.7–7.2)
Gamma Globulin: 14.2 % (ref 11.1–18.8)
M-Spike, %: NOT DETECTED g/dL
Total Protein ELP: 8.5 g/dL — ABNORMAL HIGH (ref 6.0–8.3)

## 2014-05-24 LAB — GLUCOSE, CAPILLARY
Glucose-Capillary: 112 mg/dL — ABNORMAL HIGH (ref 70–99)
Glucose-Capillary: 127 mg/dL — ABNORMAL HIGH (ref 70–99)
Glucose-Capillary: 131 mg/dL — ABNORMAL HIGH (ref 70–99)
Glucose-Capillary: 129 mg/dL — ABNORMAL HIGH (ref 70–99)

## 2014-05-24 LAB — UIFE/LIGHT CHAINS/TP QN, 24-HR UR
Albumin, U: DETECTED
Alpha 1, Urine: DETECTED — AB
Alpha 2, Urine: DETECTED — AB
Beta, Urine: DETECTED — AB
Gamma Globulin, Urine: DETECTED — AB
Total Protein, Urine: 146 mg/dL — ABNORMAL HIGH (ref 5–25)

## 2014-05-24 LAB — VITAMIN D 25 HYDROXY (VIT D DEFICIENCY, FRACTURES): Vit D, 25-Hydroxy: 28 ng/mL — ABNORMAL LOW (ref 30–100)

## 2014-05-24 MED ORDER — POTASSIUM CHLORIDE CRYS ER 20 MEQ PO TBCR: 40.0000 meq | EXTENDED_RELEASE_TABLET | Freq: Once | ORAL | Status: DC

## 2014-05-24 MED ORDER — DICYCLOMINE HCL 20 MG PO TABS
20.0000 mg | ORAL_TABLET | Freq: Three times a day (TID) | ORAL | Status: DC
  Administered 2014-05-24 – 2014-05-27 (×12): 20 mg via ORAL
  Filled 2014-05-24 (×14): qty 1

## 2014-05-24 MED ORDER — POTASSIUM CHLORIDE CRYS ER 20 MEQ PO TBCR
40.0000 meq | EXTENDED_RELEASE_TABLET | Freq: Once | ORAL | Status: AC
  Administered 2014-05-24: 40 meq via ORAL
  Filled 2014-05-24: qty 2

## 2014-05-24 MED ORDER — METOCLOPRAMIDE HCL 10 MG PO TABS: 10.0000 mg | ORAL_TABLET | Freq: Three times a day (TID) | ORAL | Status: DC | PRN

## 2014-05-24 NOTE — Clinical Social Work Note (Signed)
Clinical Social Worker has assessed patient and pt's family. Full psychosocial to follow.   Glendon Axe, MSW, LCSWA [phone] 05/24/2014 3:40 PM

## 2014-05-24 NOTE — Progress Notes (Signed)
Pt refuses to wear bipap at this time. He stated he doesn't wear it at home.

## 2014-05-24 NOTE — Progress Notes (Signed)
Pt left for MRI approx 1130. Prior to leaving, pt was A&Ox4 and had a conversation with Dr. Alva Garnet about the reason for having the MRI. Pt understood and was active in his care plan. Approx 1 hour after the pt left, MRI called and said the pt refused the MRI. MD was notified and put the MRI on hold. When the pt arrived, the pt was alert but confused x4. Family concerned that the pt was not clear enough to make decisions for himself. MD notified again, and verbalized that the MRI be put on hold at least a day.  O2 95 RA BP 130/76

## 2014-05-24 NOTE — Progress Notes (Signed)
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  Wife reports hx of Lithium administration for a number of years and prior dehydration and renal failure(2012) and was taking vita D and calcium  Objective: Vital signs in last 24 hours: Temp:  [97.4 F (36.3 C)-99.5 F (37.5 C)] 98.2 F (36.8 C) (12/23 0746) Pulse Rate:  [75-101] 100 (12/23 0800) Resp:  [11-25] 12 (12/23 0800) BP: (58-129)/(23-60) 94/47 mmHg (12/23 0800) SpO2:  [86 %-100 %] 99 % (12/23 0800) Weight:  [195.5 kg (431 lb)] 195.5 kg (431 lb) (12/23 0500) Weight change: 2.722 kg (6 lb)  Intake/Output from previous day: 12/22 0701 - 12/23 0700 In: 3431.7 [I.V.:3431.7] Out: 7900 [Urine:7900] Intake/Output this shift:    General appearance: alert and slowed mentation Resp: clear to auscultation bilaterally Cor RRR distant Extremities: edema 2+  Lab Results:  Recent Labs  05/22/14 0038 05/23/14 0252  WBC 15.5* 10.7*  HGB 13.2 12.0*  HCT 39.9 37.2*  PLT 461* 443*   BMET:  Recent Labs  05/23/14 1420 05/24/14 0606  NA 126* 131*  K 4.6 3.3*  CL 92* 95*  CO2 18* 22  GLUCOSE 128* 106*  BUN 112* 74*  CREATININE 6.51* 3.35*  CALCIUM 9.6 9.0    Recent Labs  05/22/14 0248  PTH 10*   Iron Studies: No results for input(s): IRON, TIBC, TRANSFERRIN, FERRITIN in the last 72 hours. Studies/Results: US Renal Port  05/22/2014   CLINICAL DATA:  Renal failure.  EXAM: RENAL/URINARY TRACT ULTRASOUND COMPLETE  COMPARISON:  CT abdomen and pelvis and renal  ultrasound 08/02/2012  FINDINGS: Examination limited by patient body habitus.  Right Kidney:  Length: 12.1 cm. Echogenicity within normal limits. No mass or hydronephrosis visualized.  Left Kidney:  Length: 13.3 cm. Echogenicity within normal limits. No mass or hydronephrosis visualized.  Bladder:  Not visualized. A Foley catheter was in place but not visualized sonographically.  IMPRESSION: No hydronephrosis.   Electronically Signed   By: Logan Bores   On: 05/22/2014 10:24   Scheduled: . fenofibrate  160 mg Oral Daily  . gabapentin  600 mg Oral BID  . heparin  5,000 Units Subcutaneous 3 times per day  . insulin aspart  0-15 Units Subcutaneous TID WC  . levothyroxine  100 mcg Oral QAC breakfast  . morphine  30 mg Oral Q12H  . sodium chloride  1,000 mL Intravenous Once  . sodium polystyrene  60 g Oral Once  . venlafaxine  75 mg Oral BID     LOS: 3 days   [name] C 05/24/2014,8:47 AM

## 2014-05-24 NOTE — Progress Notes (Signed)
Pt a/o, no c/o pain, pt has fungal rash to abd folds and groin, no other open areas noted, pt has foley draining good amt of clear/yellow urine, vss, pt stable

## 2014-05-24 NOTE — Progress Notes (Signed)
[place] Progress Note Patient Name: HARLO FABELA DOB: 07/24/1965 MRN: [id number]   Date of Service  05/24/2014  HPI/Events of Note  Pt's family requesting to have bentyl and reglan resumed >> home medications.   eICU Interventions  Orders placed to resume both medications.     Intervention Category Major Interventions: Other:  Elany Felix 05/24/2014, 6:50 PM

## 2014-05-24 NOTE — Progress Notes (Signed)
Rehab Admissions Coordinator Note:  Patient was screened by Cleatrice Burke for appropriateness for an Inpatient Acute Rehab Consult per PT recommendation.  At this time, we are recommending [facility]. AARP Medicare will not approve admission for this diagnosis.  Cleatrice Burke 05/24/2014, 8:40 AM  I can be reached at [phone].

## 2014-05-24 NOTE — Progress Notes (Signed)
PULMONARY / CRITICAL CARE MEDICINE   Name: [name] MRN: [id number] DOB: 1966/05/05    ADMISSION DATE:  05/21/2014  REFERRING MD :  OSH  CHIEF COMPLAINT: urinary retention  INITIAL PRESENTATION:  48yo morbidly obese male with hx HTN, OSA, DM, bipolar, schizophrenia presented to outside hospital with c/o urinary retention n/v and weakness x 4 days.  Found to have acute on chronic renal failure with SCr >7 and hyponatremia (Na 115).  2.4L out on initial insertion of foley. Tx to [facility] for further eval.    STUDIES:  12/21 Renal US: no hydronephrosis  SIGNIFICANT EVENTS:   12/22 Renal consult requested 12/23 Cognition and Cr much improved. Transfer to med-surg 12/23 MRI L-spine ordered to eval urinary retention (?neurogenic bladder) and profound LE weakness. Pt got to MRI scanner and became quite agitated and paranoid. Study cancelled  SUBJECTIVE:  Cognition much improved. Quite appropriate and well oriented. NAD. No new complaints  VITAL SIGNS: Temp:  [97.4 F (36.3 C)-99.5 F (37.5 C)] 98 F (36.7 C) (12/23 1259) Pulse Rate:  [78-101] 96 (12/23 0900) Resp:  [12-25] 15 (12/23 0900) BP: (83-130)/(23-76) 130/76 mmHg (12/23 1300) SpO2:  [86 %-100 %] 95 % (12/23 1300) Weight:  [195.5 kg (431 lb)] 195.5 kg (431 lb) (12/23 0500) HEMODYNAMICS:   VENTILATOR SETTINGS:   INTAKE / OUTPUT:  Intake/Output Summary (Last 24 hours) at 05/24/14 1431 Last data filed at 05/24/14 0900  Gross per 24 hour  Intake 3831.66 ml  Output   9100 ml  Net -5268.34 ml    PHYSICAL EXAMINATION: General:  NAD, morbidly obese Neuro: Very weak LEs HEENT: WNL  Cardiovascular: Reg, no M Lungs: clear anteriorly Abdomen:  Obese, soft, +bs  Ext: trace symmetric edema, chronic stasis changes  LABS: I have reviewed all of today's lab results. Relevant abnormalities are discussed in the A/P section  CXR: NNF  ASSESSMENT / PLAN:  PULMONARY Hx asthma  OSA P:   Cont PRN BDs Cont  nocturnal BiPAP if he will comply Supplemental O2 as needed   CARDIOVASCULAR Hx HTN  Hypotension - mild.  Responsive to fluids dHF - EF 50-55% 2012, pro BNP 202 on 12/21 Hyperlipidemia  P:  Cont holding home anti-HTN for now   RENAL Acute on chronic renal failure, nonoliguric - improving Obstructive uropathy, resolved with Foley cath placement Chronic ACEI therapy Hyperkalemia, resolved Hyponatremia Met acidosis P:   Monitor BMET intermittently Monitor I/Os Correct electrolytes as indicated Renal service following HCO3 gtt per Renal Plan repeat LS spine MRI to assess apparnet neurogenic bladder    -Reorder 12/24  -Wife wishes to go with him to MRI when performed   GASTROINTESTINAL Morbid obesity  Nausea/vomiting, resolved Chronic PPI therapy P:   Cont PPI Cont diet as tolerated  HEMATOLOGIC No active issue  P:  DVT px: SQ heparin Monitor CBC intermittently Transfuse per usual guidelines  INFECTIOUS Recent cellulitis - clinically resolved  P:   Monitor off abx  ENDOCRINE DM  Chronic hypothyroidism DM 2, controlled presently P:   Cont L-thyroxine Cont SSI  NEUROLOGIC Hx bipolar, schizophrenia  Polypharmacy  Chronic debilitation due to obesity Chronic pain syndrome Intermittent AMS P:   Cont PRN alprazolam DC MS contin Cont gabapentin @ reduced dose Cont Effexor  Transfer to med-surg. TRH to assume 12/24 and PCCM to sign off. Discussed with Dr Nicolette Bang, MD ; [facility] [phone].  After 5:30 PM or weekends, call [phone]

## 2014-05-24 NOTE — Progress Notes (Signed)
Admission note:  Arrival Method: Patient arrived in Bariatric bed from 2MW accompanied by 2 staff. Mental Orientation: Alert and oriented x 4. Telemetry: Patient is on Telemetry 6E-12.  CCMD notified, NSR 90. Assessment: See doc Flow sheets. Skin: stage 2 noted on the left inner thigh, changed Mepiplex and also stage 1 noted in the abdominal fold on the left side, covered with mepiplex. IV: IV present, intact and infusing NaBico3. Pain:  Denies any pain for now. Tubes: Foley present draining light yellow color urine. Fall Prevention Safety Plan: Patient educated about fall prevention safety plan, understood and a knowledged.  Patient had an assisted fall in previous unit. Admission Screening: In progress. 6700 Orientation: Patient has been oriented to the unit, staff and to the room.

## 2014-05-25 ENCOUNTER — Inpatient Hospital Stay (HOSPITAL_COMMUNITY): Payer: Medicare Other

## 2014-05-25 LAB — RENAL FUNCTION PANEL
ANION GAP: 17 — AB (ref 5–15)
Albumin: 3.6 g/dL (ref 3.5–5.2)
BUN: 34 mg/dL — ABNORMAL HIGH (ref 6–23)
CO2: 25 mmol/L (ref 19–32)
Calcium: 9.4 mg/dL (ref 8.4–10.5)
Chloride: 96 mEq/L (ref 96–112)
Creatinine, Ser: 1.77 mg/dL — ABNORMAL HIGH (ref 0.50–1.35)
GFR calc Af Amer: 50 mL/min — ABNORMAL LOW (ref >90)
GFR, EST NON AFRICAN AMERICAN: 43 mL/min — AB (ref >90)
GLUCOSE: 107 mg/dL — AB (ref 70–99)
PHOSPHORUS: 1.7 mg/dL — AB (ref 2.3–4.6)
POTASSIUM: 3.8 mmol/L (ref 3.5–5.1)
SODIUM: 138 mmol/L (ref 135–145)

## 2014-05-25 LAB — PROTEIN ELECTROPHORESIS, SERUM
Albumin ELP: 52.3 % — ABNORMAL LOW (ref 55.8–66.1)
Alpha-1-Globulin: 5.3 % — ABNORMAL HIGH (ref 2.9–4.9)
Alpha-2-Globulin: 14.5 % — ABNORMAL HIGH (ref 7.1–11.8)
BETA 2: 6.3 % (ref 3.2–6.5)
Beta Globulin: 8.5 % — ABNORMAL HIGH (ref 4.7–7.2)
GAMMA GLOBULIN: 13.1 % (ref 11.1–18.8)
M-Spike, %: NOT DETECTED g/dL
Total Protein ELP: 7.3 g/dL (ref 6.0–8.3)

## 2014-05-25 LAB — PARATHYROID HORMONE, INTACT (NO CA): PTH: 50 pg/mL (ref 14–64)

## 2014-05-25 LAB — GLUCOSE, CAPILLARY
GLUCOSE-CAPILLARY: 118 mg/dL — AB (ref 70–99)
GLUCOSE-CAPILLARY: 115 mg/dL — AB (ref 70–99)
Glucose-Capillary: 134 mg/dL — ABNORMAL HIGH (ref 70–99)
Glucose-Capillary: 117 mg/dL — ABNORMAL HIGH (ref 70–99)

## 2014-05-25 MED ORDER — ALPRAZOLAM 0.5 MG PO TABS
0.5000 mg | ORAL_TABLET | Freq: Once | ORAL | Status: AC
  Administered 2014-05-25: 0.5 mg via ORAL
  Filled 2014-05-25: qty 1

## 2014-05-25 MED ORDER — ALPRAZOLAM 0.5 MG PO TABS
1.0000 mg | ORAL_TABLET | Freq: Three times a day (TID) | ORAL | Status: DC | PRN
  Administered 2014-05-25 – 2014-05-27 (×6): 1 mg via ORAL
  Filled 2014-05-25 (×6): qty 2

## 2014-05-25 NOTE — Clinical Social Work Note (Addendum)
Clinical Social Work Department CLINICAL SOCIAL WORK PLACEMENT NOTE 05/25/2014  Patient:  [name], [name]  Account Number:  [id number] Admit date:  05/21/2014  Clinical Social Worker:  [place] Axe, CLINICAL SOCIAL WORKER  Date/time:  05/25/2014 08:55 AM  Clinical Social Work is seeking post-discharge placement for this patient at the following level of care:   [place]   (*CSW will update this form in Epic as items are completed)   05/25/2014  Patient/family provided with [place] Department of Clinical Social Work's list of facilities offering this level of care within the geographic area requested by the patient (or if unable, by the patient's family).  05/25/2014  Patient/family informed of their freedom to choose among providers that offer the needed level of care, that participate in Medicare, Medicaid or managed care program needed by the patient, have an available bed and are willing to accept the patient.  05/25/2014  Patient/family informed of [facility]' ownership interest in [facility], as well as of the fact that they are under no obligation to receive care at this facility.  PASARR submitted to EDS on 05/25/2014 PASARR number received on 05/25/2014  FL2 transmitted to all facilities in geographic area requested by pt/family on  05/25/2014 FL2 transmitted to all facilities within larger geographic area on 05/25/2014, CSW Sharlet Salina also sent information to [facility] in [place], [place].  Patient informed that his/her managed care company has contracts with or will negotiate with  certain facilities, including the following:  YES   Patient/family informed of bed offers received:  05/25/14 Patient chooses bed at  Physician recommends and patient chooses bed at    Patient to be transferred to  on   Patient to be transferred to facility by  Patient and family notified of transfer on  Name of family member notified:    The following physician  request were entered in Epic:  Additional Comments: 05/25/14: Bed offers provided to wife who was at the bedside. She requested that patient's information be sent to [facility] in [place], [place].  Shelle Iron, [place] [phone])    Gl[place]on Axe, MSW, LCSWA [phone] 05/25/2014 8:56 AM

## 2014-05-25 NOTE — Progress Notes (Signed)
Physical Therapy Treatment Patient Details Name: [name] MRN: [id number] DOB: 1965/12/01 Today's Date: 05/25/2014    History of Present Illness 48 yo morbidly obese male with hx HTN, OSA, DM, bipolar, schizophrenia presented to outside hospital with c/o urinary retention n/v and weakness x 4 days.  Found to have acute on chronic renal failure.    PT Comments    Pt was seen for there ex to LE's and discussed his PLOF with limited gait since last several months.  Pt is very passive and really encouraged him to participate in the rolling and controlling this as he is taking 3 staff to get cleaned up.  CIR is recommended if he can be admitted, as he will not be successful to go home with only his wife to care for him.  Follow Up Recommendations  CIR     Equipment Recommendations  None recommended by PT    Recommendations for Other Services Rehab consult;OT consult     Precautions / Restrictions Precautions Precautions: Fall Restrictions Weight Bearing Restrictions: No    Mobility  Bed Mobility Overal bed mobility: Needs Assistance Bed Mobility: Rolling Rolling: Max assist;+2 for physical assistance         General bed mobility comments: Worked on controlling rollling as pt is incontinent and has been requiring 2-3 staff to get cleaned up.  Rolled and used bedrail, PT cueing push with legs and controlling staying on his side to increase his participation in the process  Transfers                    Ambulation/Gait                 Stairs            Wheelchair Mobility    Modified Rankin (Stroke Patients Only)       Balance                                    Cognition Arousal/Alertness: Awake/alert Behavior During Therapy: WFL for tasks assessed/performed Overall Cognitive Status: Within Functional Limits for tasks assessed                      Exercises General Exercises - Lower Extremity Ankle  Circles/Pumps: AROM;Both;5 reps Quad Sets: AROM;Both;5 reps Short Arc Quad: AROM;Both;5 reps Long Arc Quad: AROM;Both;10 reps Heel Slides: AROM;Both;5 reps Straight Leg Raises: AAROM;Both;10 reps    General Comments        Pertinent Vitals/Pain Pain Assessment: No/denies pain    Home Living                      Prior Function            PT Goals (current goals can now be found in the care plan section) Acute Rehab PT Goals Patient Stated Goal: to get home Progress towards PT goals: Progressing toward goals    Frequency  Min 3X/week    PT Plan Current plan remains appropriate    Co-evaluation             End of Session   Activity Tolerance: Patient limited by fatigue Patient left: in bed;with call bell/phone within reach;with nursing/sitter in room     Time: 5284-1324 PT Time Calculation (min) (ACUTE ONLY): 35 min  Charges:  $Therapeutic Exercise: 8-22 mins $Therapeutic Activity: 8-22 mins  G CodesRamond Dial 05/25/2014, 9:14 AM   Mee Hives, PT MS Acute Rehab Dept. Number: [phone]

## 2014-05-25 NOTE — Clinical Social Work Psychosocial (Signed)
Clinical Social Work Department BRIEF PSYCHOSOCIAL ASSESSMENT 05/25/2014  Patient:  [name], AFFELDT     Account Number:  [id number]     Admit date:  05/21/2014  Clinical Social Worker:  [place] Axe, CLINICAL SOCIAL WORKER  Date/Time:  05/24/2014 08:14 AM  Referred by:  Physician  Date Referred:  05/24/2014 Referred for  SNF Placement   Other Referral:   Interview type:  Other - See comment Other interview type:   CSW met with patient and pt's wife, Jenny Reichmann at bedside.    PSYCHOSOCIAL DATA Living Status:  WIFE Admitted from facility:   Level of care:   Primary support name:  Daeton Kluth Primary support relationship to patient:  SPOUSE Degree of support available:   Strong    CURRENT CONCERNS Current Concerns  Post-Acute Placement   Other Concerns:    SOCIAL WORK ASSESSMENT / PLAN Clinical Social Worker met with patient and pt's wife present at bedside. CSW explained CSW role and SNF process. CSW provided and reviewed SNF list with pt and pt's wife. Pt's wife reported she would like pt placed in [place] area. Pt will require a facility able to manage bariatric patients. CSW will extend search to other counties. Pt's wife also reported pt's sister will be involved in placement decision. CSW will continue to follow patient and pt's family for continued support and to facilitate pt's discharge needs once medically stable.   Assessment/plan status:  Psychosocial Support/Ongoing Assessment of Needs Other assessment/ plan:   Information/referral to community resources:   SNF information/list provided.    PATIENT'S/FAMILY'S RESPONSE TO PLAN OF CARE: Pt lying in bed, alert and oriented. Pt and pt's wife pleasant and appreciated social work intervention. Pt's wife and sister very invovled in pt's care. Pt and pt's family agreeable to SNF placement.       Glendon Axe, MSW, LCSWA [phone] 05/25/2014 8:30 AM

## 2014-05-25 NOTE — Progress Notes (Signed)
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 3 Morbid obesity 4 Hyponatremia, resolved Rec: Reduce fluids as tolerated; we will sign off   Subjective: Interval History: High UOP remains  Objective: Vital signs in last 24 hours: Temp:  [98 F (36.7 C)-98.6 F (37 C)] 98.5 F (36.9 C) (12/24 0900) Pulse Rate:  [65-105] 65 (12/24 0900) Resp:  [18-20] 18 (12/24 0900) BP: (111-168)/(53-78) 115/72 mmHg (12/24 0900) SpO2:  [93 %-95 %] 95 % (12/24 0900) Weight change:   Intake/Output from previous day: 12/23 0701 - 12/24 0700 In: 950.8 [I.V.:950.8] Out: 6200 [Urine:6200] Intake/Output this shift: Total I/O In: -  Out: 1 [Stool:1]  General appearance: alert and cooperative Head: Normocephalic, without obvious abnormality, atraumatic GI: soft, non-tender; bowel sounds normal; no masses,  no organomegaly and obese Extremities: edema 2-3  Morbid obesity  Lab Results:  Recent Labs  05/23/14 0252  WBC 10.7*  HGB 12.0*  HCT 37.2*  PLT 443*   BMET:  Recent Labs  05/24/14 0606 05/25/14 0432  NA 131* 138  K 3.3* 3.8  CL 95* 96  CO2 22 25  GLUCOSE 106* 107*  BUN 74* 34*  CREATININE 3.35* 1.77*  CALCIUM 9.0 9.4   No results for input(s): PTH in the last 72 hours. Iron Studies: No results for input(s): IRON, TIBC, TRANSFERRIN, FERRITIN in the last 72 hours. Studies/Results: Mr Lumbar Spine Wo Contrast  05/25/2014   CLINICAL DATA:  431lbs pt who refused exam but came back this morning and started exam but refused to continue due to back pain. 48yo morbidly obese male with hx HTN, OSA, DM, bipolar, schizophrenia presented to outside hospital with c/o urinary retention n/v and weakness x 4 days. Found to have acute on chronic renal failure with Cr >7 and hyponatremia (Na 115). 2.4L out on initial insertion of foley. Only 2 sequences  EXAM: MRI LUMBAR  SPINE WITHOUT CONTRAST  TECHNIQUE: Multiplanar, multisequence MR imaging of the lumbar spine was performed. No intravenous contrast was administered.  COMPARISON:  None.  FINDINGS: Extremely limited examination secondary to body habitus and limited sequences.  The vertebral bodies of the lumbar spine are normal in size. The vertebral bodies of the lumbar spine are normal in alignment. There is normal bone marrow signal demonstrated throughout the vertebra. The intervertebral disc spaces are well-maintained.  The spinal cord is normal in signal and contour. The cord terminates normally at T12 . The nerve roots of the cauda equina and the filum terminale are normal.  The visualized portions of the SI joints are unremarkable.  The imaged intra-abdominal contents are unremarkable.  T11-12: Mild broad-based disc bulge. No foraminal or central canal stenosis.  T12-L1: Minimal broad-based disc bulge. No evidence of neural foraminal stenosis. No central canal stenosis.  L1-L2: Minimal broad-based disc bulge. No evidence of neural foraminal stenosis. No central canal stenosis.  L2-L3: No significant disc bulge. No evidence of neural foraminal stenosis. No central canal stenosis.  L3-L4: No significant disc bulge. No evidence of neural foraminal stenosis. No central canal stenosis.  L4-L5: Left paracentral disc bulge. No evidence of neural foraminal stenosis. No central canal stenosis.  L5-S1: No significant disc bulge. No evidence of neural foraminal stenosis. No central canal stenosis.  IMPRESSION: 1. Extremely limited examination secondary to incomplete imaging. No evidence of spinal stenosis or foraminal stenosis.   Electronically Signed   By: Kathreen Devoid  On: 05/25/2014 11:39   Scheduled: . dicyclomine  20 mg Oral TID AC & HS  . fenofibrate  160 mg Oral Daily  . gabapentin  600 mg Oral BID  . heparin  5,000 Units Subcutaneous 3 times per day  . insulin aspart  0-15 Units Subcutaneous TID WC  . levothyroxine   100 mcg Oral QAC breakfast  . venlafaxine  75 mg Oral BID     LOS: 4 days   Kailen Name C 05/25/2014,11:50 AM

## 2014-05-25 NOTE — Progress Notes (Addendum)
[facility] PROGRESS NOTE  [name] RJJ:[id number] DOB: 1965-09-10 DOA: 05/21/2014 PCP: [name], MD  Assessment/Plan 48 y/o morbidly obese male with hx HTN, OSA, DM, bipolar, schizophrenia, chronic pain syndrome  presented to outside hospital with c/o urinary retention n/v and weakness x 4 days. -admitted with acute on chronic renal failure with SCr >7 and hyponatremia (Na 115). 2.4L out on initial insertion of foley. Tx to [facility] for further eval.   1. AKI on CKD III, obstructive uropathy/? Neurogenic bladder; renal US: no hydro   -patient was also taking diuretics/ACE -renal function improved on IVF/HCO3 per nephrology; cont foley  2. Urinary retention; resolved with foley; ? Neurogenic bladder; consulted urology evaluation  3. Hyponatremia due to inability to excrete free water; Na improved  4. Chronic pain; for now holding scheduled MS contin; cont prn opioids, xanax  5. DM, HA1c-9.4; stable ISS;  cont insulin regimen  6. Morbid obesity; needs outpatient weight reduction surgery eval;  7. Hypothyroidism, cont levothyroxine;' TSH 1.8  D/w Dr. [name] who recommended to cont foley, and  f/u in his office in 1 week; phone [phone]  Code Status: full Family Communication: d/w patient, his wife (indicate person spoken with, relationship, and if by phone, the number) Disposition Plan: SNF   Consultants:  Pulmonary   Procedures:  none  Antibiotics:  none (indicate start date, and stop date if known)  HPI/Subjective: alert  Objective: Filed Vitals:   05/25/14 0900  BP: 115/72  Pulse: 65  Temp: 98.5 F (36.9 C)  Resp: 18    Intake/Output Summary (Last 24 hours) at 05/25/14 1459 Last data filed at 05/25/14 3235  Gross per 24 hour  Intake 550.83 ml  Output   4301 ml  Net -3750.17 ml   Filed Weights   05/22/14 0328 05/23/14 0426 05/24/14 0500  Weight: 191.418 kg (422 lb) 192.779 kg (425 lb) 195.5 kg (431 lb)    Exam:   General:   alert  Cardiovascular: s1,s2 rrr  Respiratory: CTA BL  Abdomen: soft, obese, nt  Musculoskeletal: mild non pitting edema    Data Reviewed: Basic Metabolic Panel:  Recent Labs Lab 05/22/14 0038  05/22/14 1149 05/22/14 1808 05/23/14 1420 05/24/14 0606 05/25/14 0432  NA 118*  < > 121* 123* 126* 131* 138  K 5.6*  < > 5.9* 5.7* 4.6 3.3* 3.8  CL 75*  < > 78* 80* 92* 95* 96  CO2 19  < > 19 19 18* 22 25  GLUCOSE 137*  < > 109* 95 128* 106* 107*  BUN 98*  < > 105* 106* 112* 74* 34*  CREATININE 7.08*  < > 6.77* 6.96* 6.51* 3.35* 1.77*  CALCIUM 14.4*  < > 12.9* 12.0* 9.6 9.0 9.4  MG 2.6*  --   --   --   --   --   --   PHOS 11.2*  --   --   --   --  3.7 1.7*  < > = values in this interval not displayed. Liver Function Tests:  Recent Labs Lab 05/22/14 0038 05/24/14 0606 05/25/14 0432  AST 19  --   --   ALT 17  --   --   ALKPHOS 49  --   --   BILITOT 0.3  --   --   PROT 8.8*  --   --   ALBUMIN 4.5 3.7 3.6    Recent Labs Lab 05/22/14 0038  LIPASE 55  AMYLASE 66   No results for  input(s): AMMONIA in the last 168 hours. CBC:  Recent Labs Lab 05/22/14 0038 05/23/14 0252  WBC 15.5* 10.7*  HGB 13.2 12.0*  HCT 39.9 37.2*  MCV 81.9 83.0  PLT 461* 443*   Cardiac Enzymes: No results for input(s): CKTOTAL, CKMB, CKMBINDEX, TROPONINI in the last 168 hours. BNP (last 3 results)  Recent Labs  05/22/14 0038  PROBNP 202.3*   CBG:  Recent Labs Lab 05/24/14 1257 05/24/14 1623 05/24/14 2201 05/25/14 0728 05/25/14 1156  GLUCAP 127* 131* 129* 118* 134*    Recent Results (from the past 240 hour(s))  MRSA PCR Screening     Status: None   Collection Time: 05/21/14 11:20 PM  Result Value Ref Range Status   MRSA by PCR NEGATIVE NEGATIVE Final    Comment:        The GeneXpert MRSA Assay (FDA approved for NASAL specimens only), is one component of a comprehensive MRSA colonization surveillance program. It is not intended to diagnose MRSA infection nor to guide  or monitor treatment for MRSA infections.      Studies: Mr Lumbar Spine Wo Contrast  05/25/2014   CLINICAL DATA:  431lbs pt who refused exam but came back this morning and started exam but refused to continue due to back pain. 48yo morbidly obese male with hx HTN, OSA, DM, bipolar, schizophrenia presented to outside hospital with c/o urinary retention n/v and weakness x 4 days. Found to have acute on chronic renal failure with Cr >7 and hyponatremia (Na 115). 2.4L out on initial insertion of foley. Only 2 sequences  EXAM: MRI LUMBAR SPINE WITHOUT CONTRAST  TECHNIQUE: Multiplanar, multisequence MR imaging of the lumbar spine was performed. No intravenous contrast was administered.  COMPARISON:  None.  FINDINGS: Extremely limited examination secondary to body habitus and limited sequences.  The vertebral bodies of the lumbar spine are normal in size. The vertebral bodies of the lumbar spine are normal in alignment. There is normal bone marrow signal demonstrated throughout the vertebra. The intervertebral disc spaces are well-maintained.  The spinal cord is normal in signal and contour. The cord terminates normally at T12 . The nerve roots of the cauda equina and the filum terminale are normal.  The visualized portions of the SI joints are unremarkable.  The imaged intra-abdominal contents are unremarkable.  T11-12: Mild broad-based disc bulge. No foraminal or central canal stenosis.  T12-L1: Minimal broad-based disc bulge. No evidence of neural foraminal stenosis. No central canal stenosis.  L1-L2: Minimal broad-based disc bulge. No evidence of neural foraminal stenosis. No central canal stenosis.  L2-L3: No significant disc bulge. No evidence of neural foraminal stenosis. No central canal stenosis.  L3-L4: No significant disc bulge. No evidence of neural foraminal stenosis. No central canal stenosis.  L4-L5: Left paracentral disc bulge. No evidence of neural foraminal stenosis. No central canal stenosis.   L5-S1: No significant disc bulge. No evidence of neural foraminal stenosis. No central canal stenosis.  IMPRESSION: 1. Extremely limited examination secondary to incomplete imaging. No evidence of spinal stenosis or foraminal stenosis.   Electronically Signed   By: Kathreen Devoid   On: 05/25/2014 11:39    Scheduled Meds: . dicyclomine  20 mg Oral TID AC & HS  . fenofibrate  160 mg Oral Daily  . gabapentin  600 mg Oral BID  . heparin  5,000 Units Subcutaneous 3 times per day  . insulin aspart  0-15 Units Subcutaneous TID WC  . levothyroxine  100 mcg Oral QAC breakfast  .  venlafaxine  75 mg Oral BID   Continuous Infusions: .  sodium bicarbonate 150 mEq in sterile water 1000 mL infusion 50 mL/hr at 05/25/14 1206    Active Problems:   Acute kidney failure   Hyponatremia   Renal failure (ARF), acute on chronic   OSA (obstructive sleep apnea)   Cellulitis    Time spent: >35 minutes     Kinnie Feil  [facility] Pager [phone]. If 7PM-7AM, please contact night-coverage at [email or web address], password [facility] 05/25/2014, 2:59 PM  LOS: 4 days

## 2014-05-25 NOTE — Care Management Note (Signed)
CARE MANAGEMENT NOTE 05/25/2014  Patient:  JOANATHAN, AFFELDT   Account Number:  [id number]  Date Initiated:  05/22/2014  Documentation initiated by:  [facility]  Subjective/Objective Assessment:   Admitted from OSH with hyponatremia - renal failure.  ?? need for dialysis.     Action/Plan:   05/25/2014 Plan now for SNF for rehab. CSW working with pt .   Anticipated DC Date:  05/27/2014   Anticipated DC Plan:  [place]  CM consult      Choice offered to / List presented to:             Status of service:  In process, will continue to follow Medicare Important Message given?  YES (If response is "NO", the following Medicare IM given date fields will be blank) Date Medicare IM given:  05/25/2014 Medicare IM given by:  Aspin Palomarez Date Additional Medicare IM given:   Additional Medicare IM given by:    Discharge Disposition:    Per UR Regulation:  Reviewed for med. necessity/level of care/duration of stay  If discussed at [place] of Stay Meetings, dates discussed:    Comments:  Contact: Veitch,Cindy Spouse [phone]   [place]ford,Kathy Sister [phone]

## 2014-05-25 NOTE — Clinical Social Work Note (Signed)
[place]. Assigned unit CSW notified with updates. This CSW signing off.   Glendon Axe, MSW, LCSWA [phone] 05/25/2014 9:50 AM

## 2014-05-25 NOTE — Consult Note (Signed)
WOC wound consult note Reason for Consult: Consult requested for abd and groin skin folds and left posterior thigh.  Pt has partial thickness skin breakdown to left posterior thigh, approx 2X2X.1cm.  Red and moist, appears to be result of constant moisture and shear.  Difficult to reposition R/T size and immobility. Abd and groin skin folds with red macerated skin R/T constant moisture and high BMI.  Appearance consistent with intertrigo.   Dressing procedure/placement/frequency: Foam dressing to protect thigh and promote healing.  Interdry silver impregnated fabric to skin folds to absorb drainage and provide antimicrobial benefits.  This should be left in place for 5 days for optimal plan of care.  Directions provided for staff nurse and product ordered. Discussed plan of care with pt and he verbalized understanding. Please re-consult if further assistance is needed.  Thank-you,  Julien Girt MSN, [place], [place], [place], [place]

## 2014-05-26 LAB — VITAMIN D 1,25 DIHYDROXY
Vitamin D 1, 25 (OH)2 Total: <8 pg/mL — ABNORMAL LOW (ref 18–72)
Vitamin D3 1, 25 (OH)2: <8 pg/mL

## 2014-05-26 LAB — RENAL FUNCTION PANEL
ALBUMIN: 3.6 g/dL (ref 3.5–5.2)
Anion gap: 18 — ABNORMAL HIGH (ref 5–15)
BUN: 19 mg/dL (ref 6–23)
CO2: 26 mmol/L (ref 19–32)
CREATININE: 1.37 mg/dL — AB (ref 0.50–1.35)
Calcium: 9.3 mg/dL (ref 8.4–10.5)
Chloride: 92 mEq/L — ABNORMAL LOW (ref 96–112)
GFR calc Af Amer: 68 mL/min — ABNORMAL LOW (ref >90)
GFR calc non Af Amer: 59 mL/min — ABNORMAL LOW (ref >90)
Glucose, Bld: 106 mg/dL — ABNORMAL HIGH (ref 70–99)
Phosphorus: 1.4 mg/dL — ABNORMAL LOW (ref 2.3–4.6)
Potassium: 4.2 mmol/L (ref 3.5–5.1)
Sodium: 136 mmol/L (ref 135–145)

## 2014-05-26 LAB — GLUCOSE, CAPILLARY
GLUCOSE-CAPILLARY: 111 mg/dL — AB (ref 70–99)
GLUCOSE-CAPILLARY: 108 mg/dL — AB (ref 70–99)
GLUCOSE-CAPILLARY: 113 mg/dL — AB (ref 70–99)
Glucose-Capillary: 122 mg/dL — ABNORMAL HIGH (ref 70–99)

## 2014-05-26 LAB — CLOSTRIDIUM DIFFICILE BY PCR: Toxigenic C. Difficile by PCR: NEGATIVE

## 2014-05-26 MED ORDER — METOPROLOL TARTRATE 12.5 MG HALF TABLET
12.5000 mg | ORAL_TABLET | Freq: Two times a day (BID) | ORAL | Status: DC
  Administered 2014-05-26 – 2014-05-27 (×3): 12.5 mg via ORAL
  Filled 2014-05-26 (×4): qty 1

## 2014-05-26 MED ORDER — TAMSULOSIN HCL 0.4 MG PO CAPS
0.4000 mg | ORAL_CAPSULE | Freq: Every day | ORAL | Status: DC
  Administered 2014-05-26 – 2014-05-27 (×2): 0.4 mg via ORAL
  Filled 2014-05-26 (×2): qty 1

## 2014-05-26 MED ORDER — LOPERAMIDE HCL 2 MG PO CAPS
2.0000 mg | ORAL_CAPSULE | ORAL | Status: DC | PRN
  Administered 2014-05-26 – 2014-05-27 (×3): 2 mg via ORAL
  Filled 2014-05-26 (×5): qty 1

## 2014-05-26 NOTE — Progress Notes (Signed)
[facility] PROGRESS NOTE  [name] PIR:[id number] DOB: 10-29-1965 DOA: 05/21/2014 PCP: [name], MD  Assessment/Plan 48 y/o morbidly obese male with hx HTN, OSA, DM, bipolar, schizophrenia, chronic pain syndrome  presented to outside hospital with c/o urinary retention n/v and weakness x 4 days. -admitted with acute on chronic renal failure with SCr >7 and hyponatremia (Na 115). 2.4L out on initial insertion of foley. Tx to [facility] for further eval.   1. AKI on CKD III, obstructive uropathy/? Neurogenic bladder; renal US: no hydro; patient was also taking diuretics/ACE -renal function improved on IVF/HCO3 per nephrology; cont foley  2. Urinary retention; resolved with foley; ? Neurogenic bladder; d/w urology Dr. [name], who recommended to cont foley. F/u with him in 1-2 week as outpatient  3. Hyponatremia due to inability to excrete free water; Na improved  4. Chronic pain; for now holding scheduled MS contin; cont prn opioids, xanax  5. DM, HA1c-9.4; stable ISS;  cont insulin regimen  6. Morbid obesity; needs outpatient weight reduction surgery eval;  7. Hypothyroidism, cont levothyroxine;' TSH 1.8 8. Diarrhea; check c diff due to recent atx exposure  9. HTN, started low dose BB'; monitor titrate as needed   D/w Dr. [name] who recommended to cont foley, and  f/u in his office in 1 week; phone [phone]  Code Status: full Family Communication: d/w patient, his wife (indicate person spoken with, relationship, and if by phone, the number) Disposition Plan: SNF awaiting    Consultants:  Pulmonary   Procedures:  none  Antibiotics:  none (indicate start date, and stop date if known)  HPI/Subjective: alert  Objective: Filed Vitals:   05/26/14 1004  BP: 152/78  Pulse: 104  Temp: 98.1 F (36.7 C)  Resp: 20    Intake/Output Summary (Last 24 hours) at 05/26/14 1123 Last data filed at 05/26/14 0530  Gross per 24 hour  Intake    240 ml   Output   2750 ml  Net  -2510 ml   Filed Weights   05/22/14 0328 05/23/14 0426 05/24/14 0500  Weight: 191.418 kg (422 lb) 192.779 kg (425 lb) 195.5 kg (431 lb)    Exam:   General:  alert  Cardiovascular: s1,s2 rrr  Respiratory: CTA BL  Abdomen: soft, obese, nt  Musculoskeletal: mild non pitting edema    Data Reviewed: Basic Metabolic Panel:  Recent Labs Lab 05/22/14 0038  05/22/14 1808 05/23/14 1420 05/24/14 0606 05/25/14 0432 05/26/14 0451  NA 118*  < > 123* 126* 131* 138 136  K 5.6*  < > 5.7* 4.6 3.3* 3.8 4.2  CL 75*  < > 80* 92* 95* 96 92*  CO2 19  < > 19 18* 22 25 26   GLUCOSE 137*  < > 95 128* 106* 107* 106*  BUN 98*  < > 106* 112* 74* 34* 19  CREATININE 7.08*  < > 6.96* 6.51* 3.35* 1.77* 1.37*  CALCIUM 14.4*  < > 12.0* 9.6 9.0 9.4 9.3  MG 2.6*  --   --   --   --   --   --   PHOS 11.2*  --   --   --  3.7 1.7* 1.4*  < > = values in this interval not displayed. Liver Function Tests:  Recent Labs Lab 05/22/14 0038 05/24/14 0606 05/25/14 0432 05/26/14 0451  AST 19  --   --   --   ALT 17  --   --   --   ALKPHOS 49  --   --   --  BILITOT 0.3  --   --   --   PROT 8.8*  --   --   --   ALBUMIN 4.5 3.7 3.6 3.6    Recent Labs Lab 05/22/14 0038  LIPASE 55  AMYLASE 66   No results for input(s): AMMONIA in the last 168 hours. CBC:  Recent Labs Lab 05/22/14 0038 05/23/14 0252  WBC 15.5* 10.7*  HGB 13.2 12.0*  HCT 39.9 37.2*  MCV 81.9 83.0  PLT 461* 443*   Cardiac Enzymes: No results for input(s): CKTOTAL, CKMB, CKMBINDEX, TROPONINI in the last 168 hours. BNP (last 3 results)  Recent Labs  05/22/14 0038  PROBNP 202.3*   CBG:  Recent Labs Lab 05/25/14 0728 05/25/14 1156 05/25/14 1655 05/25/14 2041 05/26/14 0744  GLUCAP 118* 134* 117* 115* 111*    Recent Results (from the past 240 hour(s))  MRSA PCR Screening     Status: None   Collection Time: 05/21/14 11:20 PM  Result Value Ref Range Status   MRSA by PCR NEGATIVE NEGATIVE  Final    Comment:        The GeneXpert MRSA Assay (FDA approved for NASAL specimens only), is one component of a comprehensive MRSA colonization surveillance program. It is not intended to diagnose MRSA infection nor to guide or monitor treatment for MRSA infections.   Clostridium Difficile by PCR     Status: None   Collection Time: 05/25/14  6:41 PM  Result Value Ref Range Status   C difficile by pcr NEGATIVE NEGATIVE Final     Studies: Mr Lumbar Spine Wo Contrast  05/25/2014   CLINICAL DATA:  431lbs pt who refused exam but came back this morning and started exam but refused to continue due to back pain. 48yo morbidly obese male with hx HTN, OSA, DM, bipolar, schizophrenia presented to outside hospital with c/o urinary retention n/v and weakness x 4 days. Found to have acute on chronic renal failure with Cr >7 and hyponatremia (Na 115). 2.4L out on initial insertion of foley. Only 2 sequences  EXAM: MRI LUMBAR SPINE WITHOUT CONTRAST  TECHNIQUE: Multiplanar, multisequence MR imaging of the lumbar spine was performed. No intravenous contrast was administered.  COMPARISON:  None.  FINDINGS: Extremely limited examination secondary to body habitus and limited sequences.  The vertebral bodies of the lumbar spine are normal in size. The vertebral bodies of the lumbar spine are normal in alignment. There is normal bone marrow signal demonstrated throughout the vertebra. The intervertebral disc spaces are well-maintained.  The spinal cord is normal in signal and contour. The cord terminates normally at T12 . The nerve roots of the cauda equina and the filum terminale are normal.  The visualized portions of the SI joints are unremarkable.  The imaged intra-abdominal contents are unremarkable.  T11-12: Mild broad-based disc bulge. No foraminal or central canal stenosis.  T12-L1: Minimal broad-based disc bulge. No evidence of neural foraminal stenosis. No central canal stenosis.  L1-L2: Minimal broad-based  disc bulge. No evidence of neural foraminal stenosis. No central canal stenosis.  L2-L3: No significant disc bulge. No evidence of neural foraminal stenosis. No central canal stenosis.  L3-L4: No significant disc bulge. No evidence of neural foraminal stenosis. No central canal stenosis.  L4-L5: Left paracentral disc bulge. No evidence of neural foraminal stenosis. No central canal stenosis.  L5-S1: No significant disc bulge. No evidence of neural foraminal stenosis. No central canal stenosis.  IMPRESSION: 1. Extremely limited examination secondary to incomplete imaging. No evidence of spinal  stenosis or foraminal stenosis.   Electronically Signed   By: Kathreen Devoid   On: 05/25/2014 11:39    Scheduled Meds: . dicyclomine  20 mg Oral TID AC & HS  . fenofibrate  160 mg Oral Daily  . gabapentin  600 mg Oral BID  . heparin  5,000 Units Subcutaneous 3 times per day  . insulin aspart  0-15 Units Subcutaneous TID WC  . levothyroxine  100 mcg Oral QAC breakfast  . venlafaxine  75 mg Oral BID   Continuous Infusions: .  sodium bicarbonate 150 mEq in sterile water 1000 mL infusion 50 mL/hr at 05/25/14 1206    Active Problems:   Acute kidney failure   Hyponatremia   Renal failure (ARF), acute on chronic   OSA (obstructive sleep apnea)   Cellulitis    Time spent: >35 minutes     Kinnie Feil  [facility] Pager [phone]. If 7PM-7AM, please contact night-coverage at [email or web address], password [facility] 05/26/2014, 11:23 AM  LOS: 5 days

## 2014-05-27 ENCOUNTER — Encounter (HOSPITAL_COMMUNITY): Payer: Self-pay

## 2014-05-27 LAB — GLUCOSE, CAPILLARY
GLUCOSE-CAPILLARY: 104 mg/dL — AB (ref 70–99)
GLUCOSE-CAPILLARY: 141 mg/dL — AB (ref 70–99)
Glucose-Capillary: 169 mg/dL — ABNORMAL HIGH (ref 70–99)

## 2014-05-27 MED ORDER — TAMSULOSIN HCL 0.4 MG PO CAPS: 0.4000 mg | ORAL_CAPSULE | Freq: Every day | ORAL | Status: DC

## 2014-05-27 MED ORDER — METOPROLOL TARTRATE 12.5 MG HALF TABLET: 12.5000 mg | ORAL_TABLET | Freq: Two times a day (BID) | ORAL | Status: DC

## 2014-05-27 MED ORDER — HYDROCODONE-ACETAMINOPHEN 10-325 MG PO TABS: 1.0000 | ORAL_TABLET | Freq: Four times a day (QID) | ORAL | Status: DC | PRN

## 2014-05-27 MED ORDER — VENLAFAXINE HCL ER 75 MG PO CP24: 75.0000 mg | ORAL_CAPSULE | Freq: Two times a day (BID) | ORAL | Status: AC

## 2014-05-27 MED ORDER — INSULIN ASPART 100 UNIT/ML ~~LOC~~ SOLN: 0.0000 [IU] | Freq: Three times a day (TID) | SUBCUTANEOUS | Status: DC

## 2014-05-27 MED ORDER — LOPERAMIDE HCL 2 MG PO CAPS: 2.0000 mg | ORAL_CAPSULE | ORAL | Status: DC | PRN

## 2014-05-27 MED ORDER — ALPRAZOLAM 1 MG PO TABS: 1.0000 mg | ORAL_TABLET | Freq: Three times a day (TID) | ORAL | Status: DC | PRN

## 2014-05-27 MED ORDER — SAXAGLIPTIN HCL 5 MG PO TABS: 5.0000 mg | ORAL_TABLET | Freq: Every day | ORAL | Status: AC

## 2014-05-27 MED ORDER — GABAPENTIN 600 MG PO TABS: 600.0000 mg | ORAL_TABLET | Freq: Two times a day (BID) | ORAL | Status: DC

## 2014-05-27 NOTE — Progress Notes (Signed)
Discharge instructions and medications discussed with patient's wife.  Prescriptions given to wife. All questions answered.

## 2014-05-27 NOTE — Discharge Summary (Addendum)
Physician Discharge Summary  LARRI YEHLE JQB:[id number] DOB: Jan 07, 1966 DOA: 05/21/2014  PCP: [name], MD  Admit date: 05/21/2014 Discharge date: 05/27/2014  Time spent: >35 minutes  Recommendations for Outpatient Follow-up:  SNF; PT/OT Urology f/u in 1 week   Discharge Diagnoses:  Active Problems:   Acute kidney failure   Hyponatremia   Renal failure (ARF), acute on chronic   OSA (obstructive sleep apnea)   Cellulitis   Discharge Condition: stable   Diet recommendation: DM  Filed Weights   05/23/14 0426 05/24/14 0500 05/27/14 0830  Weight: 192.779 kg (425 lb) 195.5 kg (431 lb) 191.418 kg (422 lb)    History of present illness:  48 y/o morbidly obese male with hx HTN, OSA, DM, bipolar, schizophrenia, chronic pain syndrome presented to outside hospital with c/o urinary retention n/v and weakness x 4 days. -admitted with acute on chronic renal failure with SCr >7 and hyponatremia (Na 115). 2.4L out on initial insertion of foley. [facility] to [facility] further [facility].    Hospital Course:  1. AKI on CKD III, obstructive uropathy/? Neurogenic bladder; renal US: no hydro; patient was also taking diuretics/ACE -renal function improved on IVF/HCO3 per nephrology; cont foley  -hold ACE, diuretics;  2. Urinary retention; resolved with foley; ? Neurogenic bladder medication induced/TCAs; - d/w urology Dr. [name], who recommended to cont foley. F/u with him in 1-2 week as outpatient  3. Hyponatremia due to inability to excrete free water; Na improved  4. Chronic pain; for now holding scheduled MS contin; cont prn opioids, xanax  5. DM, HA1c-9.4; stable ISS; cont insulin regimen; may add lantus with improving PO intake   6. Morbid obesity; needs outpatient weight reduction surgery eval;  7. Hypothyroidism, cont levothyroxine;' TSH 1.8 8. Diarrhea improved;  c diff: neg   9. HTN, started low dose BB'; monitor titrate as needed   D/w Dr. [name] who  recommended to cont foley, and f/u in his office in 1 week; phone [phone]  Procedures:  none (i.e. Studies not automatically included, echos, thoracentesis, etc; not x-rays)  Consultations:  Nephrology   Discharge Exam: Filed Vitals:   05/27/14 1006  BP: 149/72  Pulse: 92  Temp: 99.1 F (37.3 C)  Resp: 18    General: alert Cardiovascular: s1,s2 rrr Respiratory: CTA BL  Discharge Instructions  Discharge Instructions    Ambulatory referral to Nutrition and Diabetic Education    Complete by:  As directed   Weight loss education.     Diet - low sodium heart healthy    Complete by:  As directed      Discharge instructions    Complete by:  As directed   Please follow up with urology in 1 week Please follow up with psychiatry in 2 weeks     Increase activity slowly    Complete by:  As directed             Medication List    STOP taking these medications        clomiPRAMINE 25 MG capsule  Commonly known as:  ANAFRANIL     diazepam 10 MG tablet  Commonly known as:  VALIUM     furosemide 80 MG tablet  Commonly known as:  LASIX     GRALISE 600 MG Tabs  Generic drug:  Gabapentin (Once-Daily)     lactulose 10 GM/15ML solution  Commonly known as:  CHRONULAC     lisinopril 10 MG tablet  Commonly known as:  PRINIVIL,ZESTRIL  metoCLOPramide 10 MG tablet  Commonly known as:  REGLAN     morphine 60 MG 12 hr tablet  Commonly known as:  MS CONTIN     polyethylene glycol powder powder  Commonly known as:  GLYCOLAX/MIRALAX     spironolactone 50 MG tablet  Commonly known as:  ALDACTONE     tiZANidine 4 MG tablet  Commonly known as:  ZANAFLEX      TAKE these medications        acetaminophen 500 MG tablet  Commonly known as:  TYLENOL  Take 1,000 mg by mouth every 6 (six) hours as needed (pain).     albuterol 108 (90 BASE) MCG/ACT inhaler  Commonly known as:  PROVENTIL HFA;VENTOLIN HFA  Inhale 2 puffs into the lungs every 4 (four) hours as needed for  wheezing.     ALPRAZolam 1 MG tablet  Commonly known as:  XANAX  Take 1 tablet (1 mg total) by mouth 3 (three) times daily as needed for anxiety.     dicyclomine 20 MG tablet  Commonly known as:  BENTYL  Take 20 mg by mouth 4 times daily.     esomeprazole 40 MG capsule  Commonly known as:  NEXIUM  Take 40 mg by mouth at bedtime.     fenofibrate 160 MG tablet  Take 160 mg by mouth daily.     gabapentin 600 MG tablet  Commonly known as:  NEURONTIN  Take 1 tablet (600 mg total) by mouth 2 (two) times daily.     HYDROcodone-acetaminophen 10-325 MG per tablet  Commonly known as:  NORCO  Take 1 tablet by mouth every 6 (six) hours as needed. For pain     levothyroxine 100 MCG tablet  Commonly known as:  SYNTHROID, LEVOTHROID  Take 100 mcg by mouth every morning.     loperamide 2 MG capsule  Commonly known as:  IMODIUM  Take 1 capsule (2 mg total) by mouth as needed for diarrhea or loose stools.     metoprolol tartrate 12.5 mg Tabs tablet  Commonly known as:  LOPRESSOR  Take 0.5 tablets (12.5 mg total) by mouth 2 (two) times daily.     multivitamin with minerals tablet  Take 1 tablet by mouth daily.     saxagliptin HCl 5 MG Tabs tablet  Commonly known as:  ONGLYZA  Take 1 tablet (5 mg total) by mouth daily.     tamsulosin 0.4 MG Caps capsule  Commonly known as:  FLOMAX  Take 1 capsule (0.4 mg total) by mouth daily.     temazepam 30 MG capsule  Commonly known as:  RESTORIL  Take 30 mg by mouth at bedtime.     venlafaxine XR 75 MG 24 hr capsule  Commonly known as:  EFFEXOR-XR  Take 1 capsule (75 mg total) by mouth 2 (two) times daily.       Allergies  Allergen Reactions  . Aspirin Other (See Comments)    Causes asthmatic symptoms  . Statins     Joint pain, muscle weakness, atropy   . Penicillins Rash       Follow-up Information    Follow up with Carolan Clines I, MD. Schedule an appointment as soon as possible for a visit in 1 week.   Specialty:   Urology   Contact information:   [place] [id number] [phone]        The results of significant diagnostics from this hospitalization (including imaging, microbiology, ancillary and laboratory) are  listed below for reference.    Significant Diagnostic Studies: Mr Lumbar Spine Wo Contrast  05/25/2014   CLINICAL DATA:  431lbs pt who refused exam but came back this morning and started exam but refused to continue due to back pain. 48yo morbidly obese male with hx HTN, OSA, DM, bipolar, schizophrenia presented to outside hospital with c/o urinary retention n/v and weakness x 4 days. Found to have acute on chronic renal failure with Cr >7 and hyponatremia (Na 115). 2.4L out on initial insertion of foley. Only 2 sequences  EXAM: MRI LUMBAR SPINE WITHOUT CONTRAST  TECHNIQUE: Multiplanar, multisequence MR imaging of the lumbar spine was performed. No intravenous contrast was administered.  COMPARISON:  None.  FINDINGS: Extremely limited examination secondary to body habitus and limited sequences.  The vertebral bodies of the lumbar spine are normal in size. The vertebral bodies of the lumbar spine are normal in alignment. There is normal bone marrow signal demonstrated throughout the vertebra. The intervertebral disc spaces are well-maintained.  The spinal cord is normal in signal and contour. The cord terminates normally at T12 . The nerve roots of the cauda equina and the filum terminale are normal.  The visualized portions of the SI joints are unremarkable.  The imaged intra-abdominal contents are unremarkable.  T11-12: Mild broad-based disc bulge. No foraminal or central canal stenosis.  T12-L1: Minimal broad-based disc bulge. No evidence of neural foraminal stenosis. No central canal stenosis.  L1-L2: Minimal broad-based disc bulge. No evidence of neural foraminal stenosis. No central canal stenosis.  L2-L3: No significant disc bulge. No evidence of neural foraminal stenosis. No  central canal stenosis.  L3-L4: No significant disc bulge. No evidence of neural foraminal stenosis. No central canal stenosis.  L4-L5: Left paracentral disc bulge. No evidence of neural foraminal stenosis. No central canal stenosis.  L5-S1: No significant disc bulge. No evidence of neural foraminal stenosis. No central canal stenosis.  IMPRESSION: 1. Extremely limited examination secondary to incomplete imaging. No evidence of spinal stenosis or foraminal stenosis.   Electronically Signed   By: Kathreen Devoid   On: 05/25/2014 11:39   US Renal Port  05/22/2014   CLINICAL DATA:  Renal failure.  EXAM: RENAL/URINARY TRACT ULTRASOUND COMPLETE  COMPARISON:  CT abdomen and pelvis and renal ultrasound 08/02/2012  FINDINGS: Examination limited by patient body habitus.  Right Kidney:  Length: 12.1 cm. Echogenicity within normal limits. No mass or hydronephrosis visualized.  Left Kidney:  Length: 13.3 cm. Echogenicity within normal limits. No mass or hydronephrosis visualized.  Bladder:  Not visualized. A Foley catheter was in place but not visualized sonographically.  IMPRESSION: No hydronephrosis.   Electronically Signed   By: Logan Bores   On: 05/22/2014 10:24   Dg Chest Port 1 View  05/22/2014   CLINICAL DATA:  Hypoxia  EXAM: PORTABLE CHEST - 1 VIEW  COMPARISON:  Chest x-ray of September 12, 2013  FINDINGS: The lungs are well-expanded. There is no focal infiltrate. The lung markings are coarse in the right infrahilar region. The heart and pulmonary vascularity are normal. The trachea is midline. There is no pleural effusion.  IMPRESSION: COPD . Infrahilar subsegmental atelectasis on the right is suspected. There is no pneumonia nor evidence of CHF.   Electronically Signed   By: David  Martinique   On: 05/22/2014 07:05    Microbiology: Recent Results (from the past 240 hour(s))  MRSA PCR Screening     Status: None   Collection Time: 05/21/14 11:20 PM  Result Value  Ref Range Status   MRSA by PCR NEGATIVE NEGATIVE  Final    Comment:        The GeneXpert MRSA Assay (FDA approved for NASAL specimens only), is one component of a comprehensive MRSA colonization surveillance program. It is not intended to diagnose MRSA infection nor to guide or monitor treatment for MRSA infections.   Clostridium Difficile by PCR     Status: None   Collection Time: 05/25/14  6:41 PM  Result Value Ref Range Status   C difficile by pcr NEGATIVE NEGATIVE Final     Labs: Basic Metabolic Panel:  Recent Labs Lab 05/22/14 0038  05/22/14 1808 05/23/14 1420 05/24/14 0606 05/25/14 0432 05/26/14 0451  NA 118*  < > 123* 126* 131* 138 136  K 5.6*  < > 5.7* 4.6 3.3* 3.8 4.2  CL 75*  < > 80* 92* 95* 96 92*  CO2 19  < > 19 18* 22 25 26   GLUCOSE 137*  < > 95 128* 106* 107* 106*  BUN 98*  < > 106* 112* 74* 34* 19  CREATININE 7.08*  < > 6.96* 6.51* 3.35* 1.77* 1.37*  CALCIUM 14.4*  < > 12.0* 9.6 9.0 9.4 9.3  MG 2.6*  --   --   --   --   --   --   PHOS 11.2*  --   --   --  3.7 1.7* 1.4*  < > = values in this interval not displayed. Liver Function Tests:  Recent Labs Lab 05/22/14 0038 05/24/14 0606 05/25/14 0432 05/26/14 0451  AST 19  --   --   --   ALT 17  --   --   --   ALKPHOS 49  --   --   --   BILITOT 0.3  --   --   --   PROT 8.8*  --   --   --   ALBUMIN 4.5 3.7 3.6 3.6    Recent Labs Lab 05/22/14 0038  LIPASE 55  AMYLASE 66   No results for input(s): AMMONIA in the last 168 hours. CBC:  Recent Labs Lab 05/22/14 0038 05/23/14 0252  WBC 15.5* 10.7*  HGB 13.2 12.0*  HCT 39.9 37.2*  MCV 81.9 83.0  PLT 461* 443*   Cardiac Enzymes: No results for input(s): CKTOTAL, CKMB, CKMBINDEX, TROPONINI in the last 168 hours. BNP: BNP (last 3 results)  Recent Labs  05/22/14 0038  PROBNP 202.3*   CBG:  Recent Labs Lab 05/26/14 1152 05/26/14 1741 05/26/14 2137 05/27/14 0831 05/27/14 1134  GLUCAP 122* 108* 113* 169* 104*       Signed:  Rowe Clack N  [facility] 05/27/2014, 12:54 PM    Patient declined to go SNF, d/w patient, his wife at the bediside he refused to go to SNF: wants HHC/PT' with outpatient follow up  Philbert Ocallaghan N

## 2014-05-27 NOTE — Clinical Social Work Note (Signed)
CSW made aware patient will d/c home with wife. Patient to receive home health with PT/OT. CSW met with patient and wife and confirmed decline of SNF at this time. RNCM aware and coordinating [facility] for patient. CSW further made aware of patient need for transportation assistance. CSW met with patient who is agreeable to EMS transportation. CSW verified address and prepared transportation packet and placed in patient's shadow chart. CSW to arrange transportation via [place]. No further needs.  [place], [place] Weekend Clinical Social Worker [phone]

## 2014-05-28 NOTE — Care Management Note (Signed)
Page 1 of 1   05/28/2014     8:31:16 AM CARE MANAGEMENT NOTE 05/28/2014  Patient:  [name],[name]   Account Number:  [id number]  Date Initiated:  05/22/2014  Documentation initiated by:  BROWN,  Subjective/Objective Assessment:   Admitted from OSH with hyponatremia - renal failure.  ?? need for dialysis.     Action/Plan:   05/25/2014 Plan now for SNF for rehab. CSW working with pt .   Anticipated DC Date:  05/27/2014   Anticipated DC Plan:  SKILLED NURSING FACILITY      DC Planning Services  CM consult      PAC Choice  HOME HEALTH   Choice offered to / List presented to:  C-1 Patient        HH arranged  HH-1 RN  HH-2 PT  HH-3 OT  HH-4 NURSE'S AIDE  HH-6 SOCIAL WORKER      Status of service:  Completed, signed off Medicare Important Message given?  YES (If response is "NO", the following Medicare IM given date fields will be blank) Date Medicare IM given:  05/25/2014 Medicare IM given by:  ROYAL,CHERYL Date Additional Medicare IM given:   Additional Medicare IM given by:    Discharge Disposition:  HOME W HOME HEALTH SERVICES  Per UR Regulation:  Reviewed for med. necessity/level of care/duration of stay  If discussed at Long Length of Stay Meetings, dates discussed:    Comments:  Contact: Kidd,Cindy Spouse [phone]   Azure,Kathy Sister [phone]   05/27/14 18:00 CM met with pt in room to offer choice of home health agency.  Pt chooses [facility] to render HHPT/OT/RN/Aide/SW.  Address and contact information verified with pt.  Referral called to [facility] rep, stephanie. No other Cm needs were communicated.   , BSN, CM [phone].   

## 2014-05-29 NOTE — Care Management Note (Signed)
CARE MANAGEMENT NOTE 05/29/2014  Patient:  SMOKEY, MELOTT   Account Number:  [id number]  Date Initiated:  05/22/2014  Documentation initiated by:  [facility]  Subjective/Objective Assessment:   Admitted from OSH with hyponatremia - renal failure.  ?? need for dialysis.     Action/Plan:   05/25/2014 Plan now for SNF for rehab. CSW working with pt .   Anticipated DC Date:  05/27/2014   Anticipated DC Plan:  [place]  CM consult      [facility] Choice  HOME HEALTH   Choice offered to / List presented to:  C-1 Patient        [place] arranged  HH-1 RN  [place].   Status of service:  Completed, signed off Medicare Important Message given?  YES (If response is "NO", the following Medicare IM given date fields will be blank) Date Medicare IM given:  05/25/2014 Medicare IM given by:  Janneth Krasner Date Additional Medicare IM given:   Additional Medicare IM given by:    Discharge Disposition:  [place]  Per UR Regulation:  Reviewed for med. necessity/level of care/duration of stay  If discussed at [place] of Stay Meetings, dates discussed:    Comments:  Contact: Baldus,Cindy Spouse [phone]   Merritt, Mccravy Sister [phone]   05/27/14 18:00 CM met with pt in room to offer choice of home health agency.  Pt chooses [facility] to render HHPT/OT/RN/Aide/SW.  Address and contact information verified with pt.  Referral called to [facility] rep, stephanie. No other Cm needs were communicated.  Mariane Masters, BSN, CM [phone].

## 2014-09-08 IMAGING — CT CT ABD-PELV W/ CM
2 of 4 series · 16 of 46 positions shown, 18 images · IV contrast (APPLIED)
Comparison: Abdominal ultrasound 08/06/2011.  Abdominal pelvic CT
11/04/2005.

CLINICAL DATA: Generalized abdominal pain.  Evaluate for ascites,
fatty liver and splenomegaly.

CT ABDOMEN AND PELVIS WITH CONTRAST
TECHNIQUE: Multidetector CT imaging of the abdomen and pelvis was
performed following the standard protocol during bolus
administration of intravenous contrast.
Contrast: 100mL OMNIPAQUE IOHEXOL 300 MG/ML  SOLN

[Series 2: abd/pelvis 5.0 b31f · axial · 0.98mm/px · z∈[+856,+1276]mm · 13 of 94 slices shown, 15 images]
[im 5/94  soft-tissue]
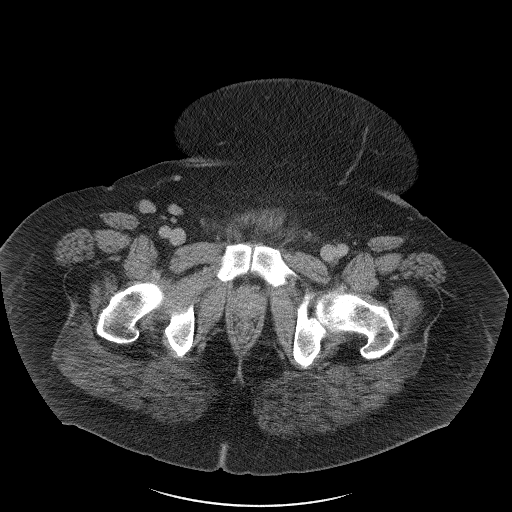
[im 5/94  bone]
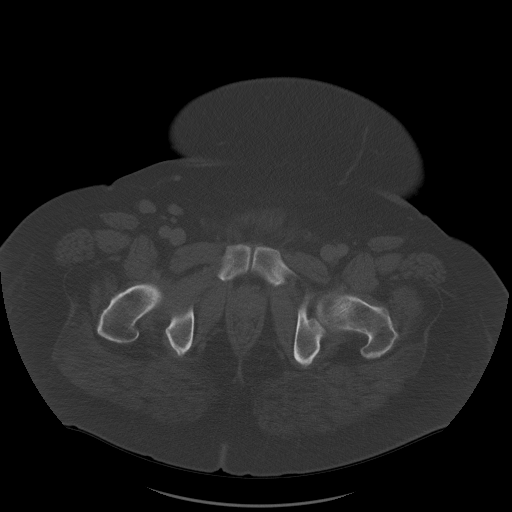
[im 13/94  soft-tissue]
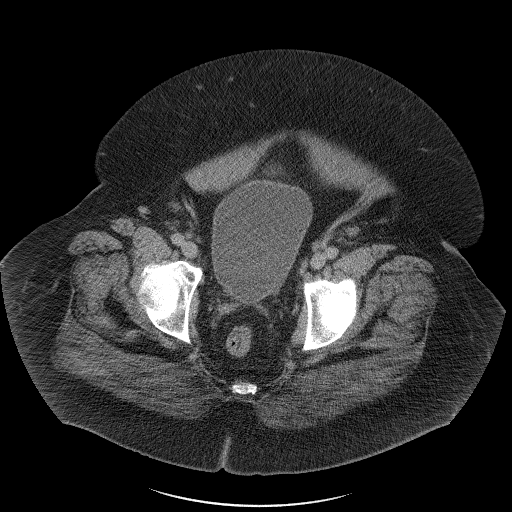
[im 22/94  soft-tissue]
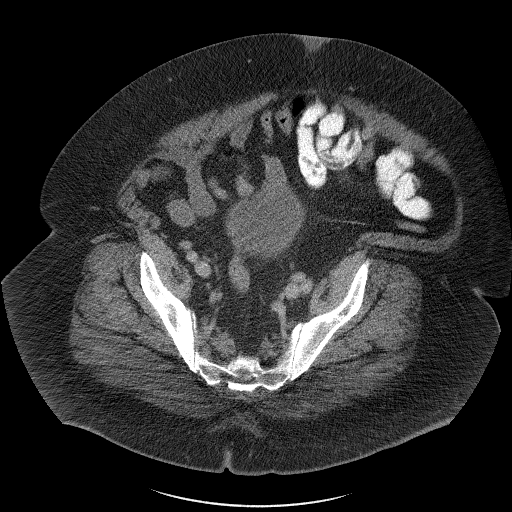
[im 26/94  soft-tissue]
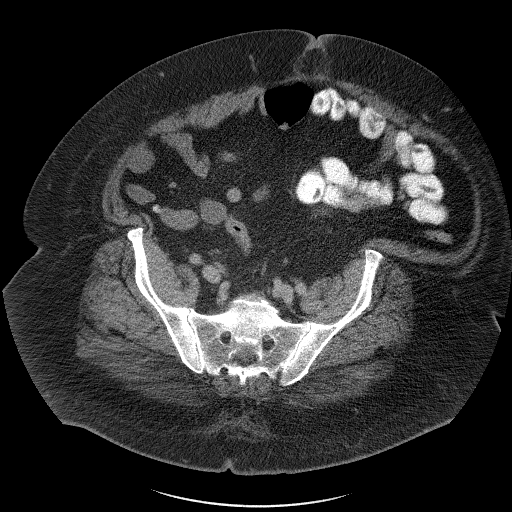
[im 34/94  soft-tissue]
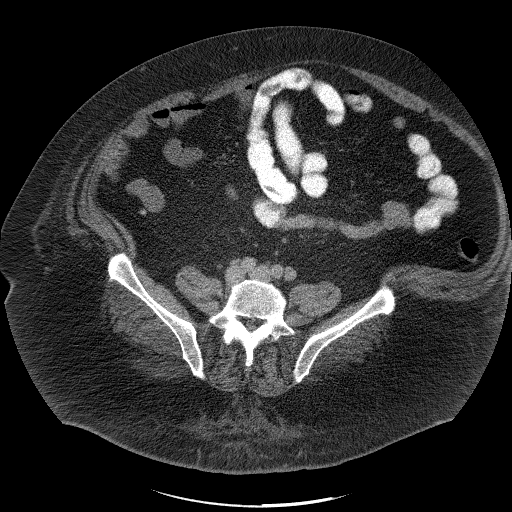
[im 39/94  soft-tissue]
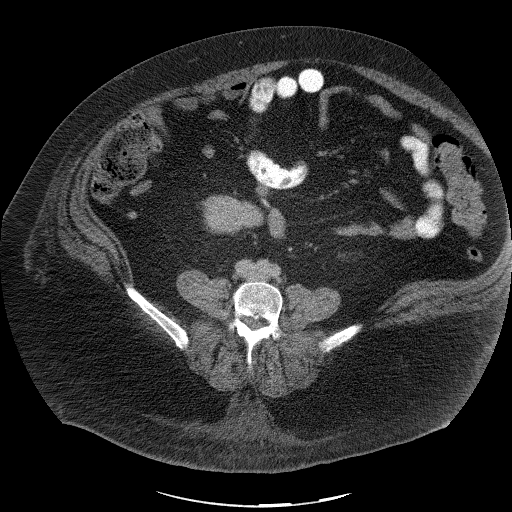
[im 47/94  soft-tissue]
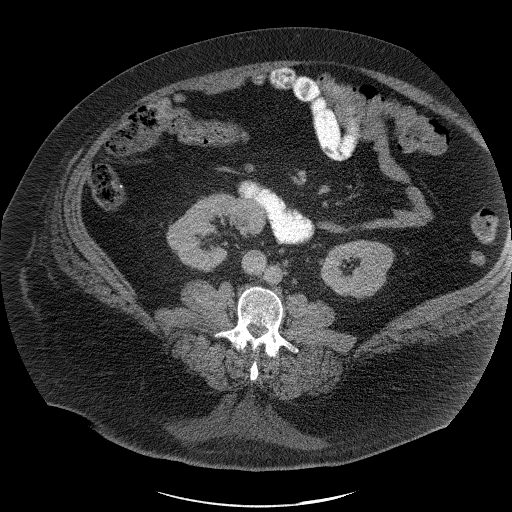
[im 55/94  soft-tissue]
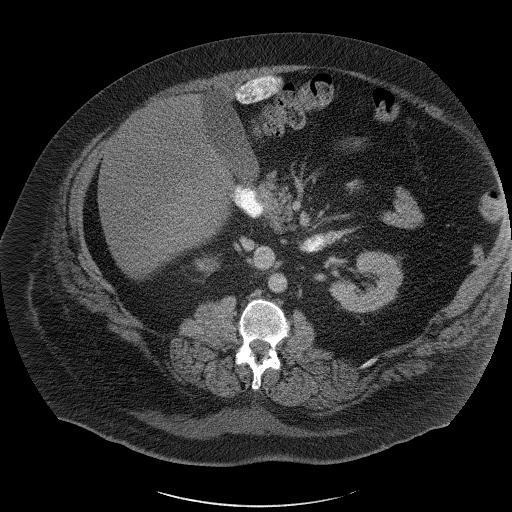
[im 60/94  soft-tissue]
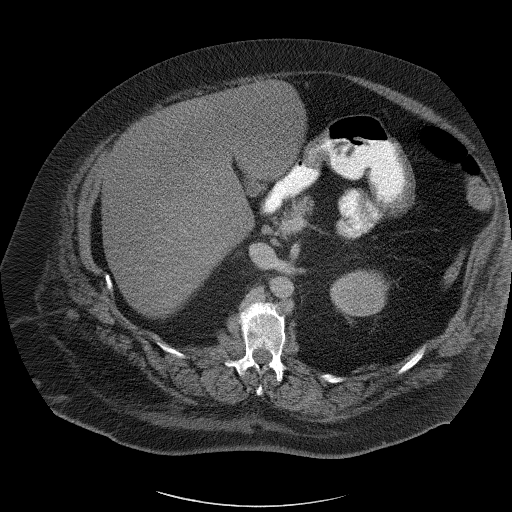
[im 60/94  bone]
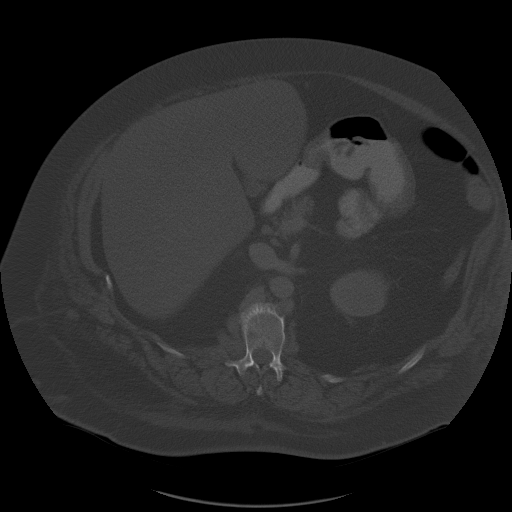
[im 68/94  soft-tissue]
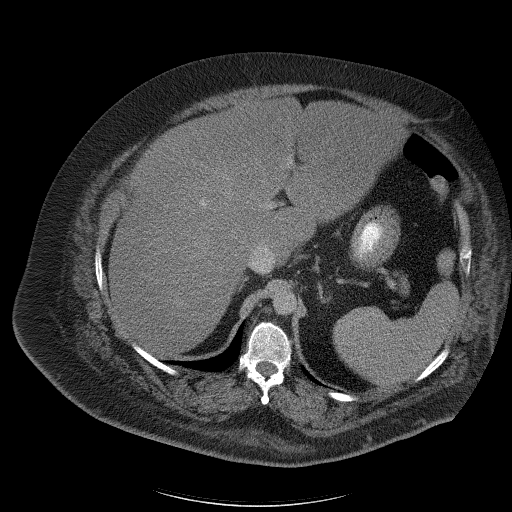
[im 72/94  soft-tissue]
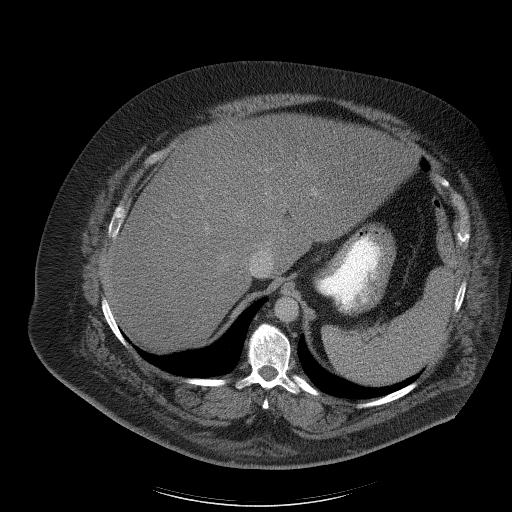
[im 81/94  soft-tissue]
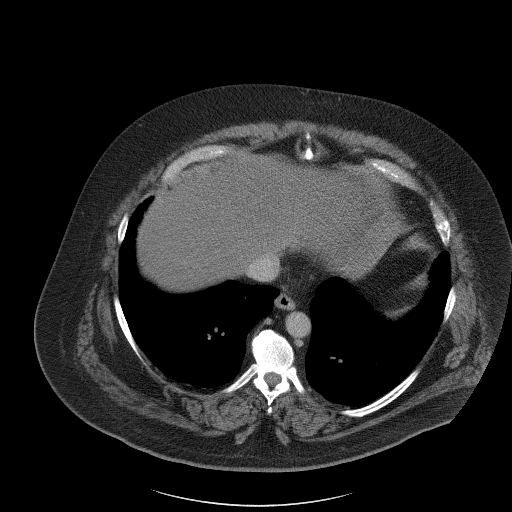
[im 89/94  soft-tissue]
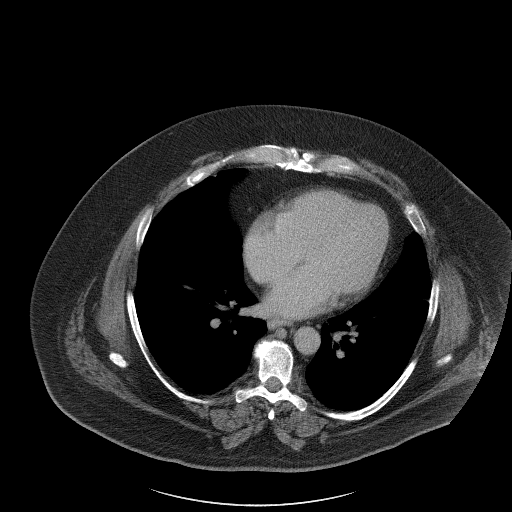

[Series 5: abd/pelvis 3.0 coronal · coronal · 0.94mm/px · 3 of 144 slices shown]
[im 48/144  soft-tissue]
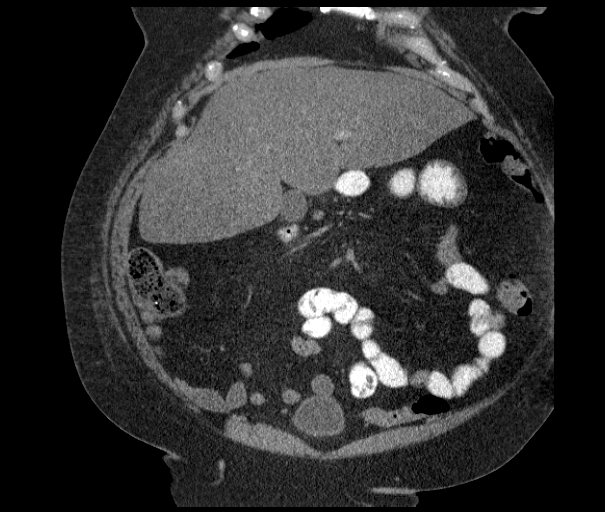
[im 64/144  soft-tissue]
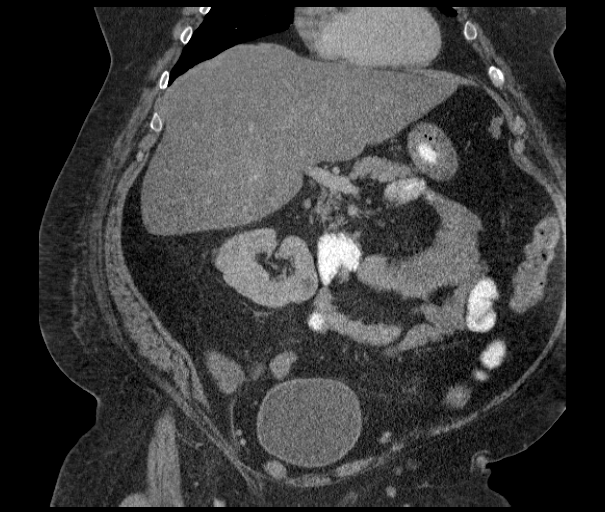
[im 80/144  soft-tissue]
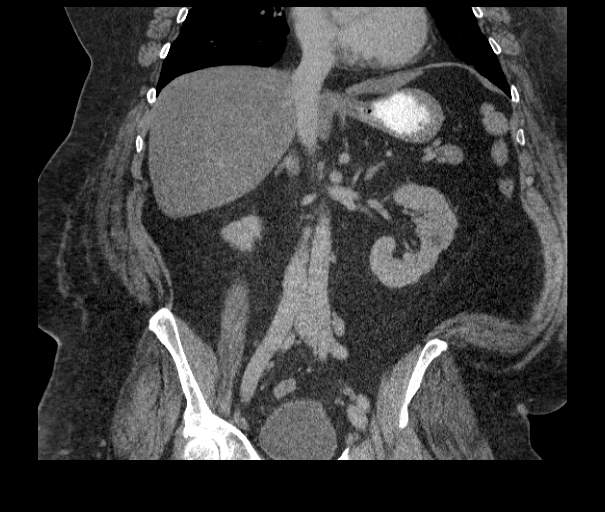

[16 of 46 positions shown; findings below may reference images not displayed]

FINDINGS: The lung bases are clear and there is no pleural
effusion.  There is a stable calcified node in the right
pericardiac region.

The liver is enlarged, measuring 33 cm transverse.  There is
diffuse hepatic low density consistent with steatosis.  No focal
liver lesions are identified.  The gallbladder, biliary system and
pancreas appear normal.  The hepatic, portal and splenic veins are
patent.

The spleen and adrenal glands appear normal.  There are low density
renal lesions bilaterally which are suboptimally evaluated due to
the patient's body habitus.  One medially within the malrotated
right kidney on image 53 is unchanged.  A 2 cm lesion in the
interpolar region of the left kidney on image 45 is also unchanged
in size.  A small lesion in the upper pole of the left kidney on
image 34 is unchanged.  There is a slightly exophytic lesion in the
interpolar region on image 41 which was not clearly present
previously. There is no hydronephrosis.

The bowel gas pattern is normal.  The appendix appears normal.
There is no ascites. No enlarged lymph nodes are seen within the
abdomen.  Within the pelvis, multiple new mildly enlarged lymph
nodes are identified.  These include a left common iliac node
measuring 9 mm on image 61, an 11 mm right pelvic sidewall node on
image 77, and a left external iliac node measuring 12 mm on image
84.  The prostate gland, seminal vesicles and bladder appear
normal.  There are no acute or suspicious osseous findings.  A
small umbilical hernia containing fat is noted.
IMPRESSION: 1.  Hepatomegaly with moderate diffuse hepatic steatosis.  No focal
lesions identified.
2.  No evidence of splenomegaly or ascites.
3.  Interval development of multiple mildly enlarged pelvic lymph
nodes bilaterally.  These are indeterminate in etiology and
potentially postinflammatory.
4.  Low-density renal lesions are mostly unchanged from the prior
study but suboptimally evaluated due to body habitus.

## 2015-07-22 ENCOUNTER — Inpatient Hospital Stay (HOSPITAL_COMMUNITY)
Admission: EM | Admit: 2015-07-22 | Discharge: 2015-07-27 | DRG: 101 | Disposition: A | Discharge status: Home Health | Payer: Medicare Other | Attending: Internal Medicine | Admitting: Internal Medicine

## 2015-07-22 ENCOUNTER — Encounter (HOSPITAL_COMMUNITY): Payer: Self-pay | Admitting: *Deleted

## 2015-07-22 ENCOUNTER — Emergency Department (HOSPITAL_COMMUNITY): Payer: Medicare Other

## 2015-07-22 DIAGNOSIS — Z87891 Personal history of nicotine dependence: Secondary | ICD-10-CM

## 2015-07-22 DIAGNOSIS — E039 Hypothyroidism, unspecified: Secondary | ICD-10-CM | POA: Diagnosis present

## 2015-07-22 DIAGNOSIS — Z880 Allergy status to penicillin: Secondary | ICD-10-CM | POA: Diagnosis not present

## 2015-07-22 DIAGNOSIS — F319 Bipolar disorder, unspecified: Secondary | ICD-10-CM | POA: Diagnosis present

## 2015-07-22 DIAGNOSIS — I5032 Chronic diastolic (congestive) heart failure: Secondary | ICD-10-CM | POA: Diagnosis present

## 2015-07-22 DIAGNOSIS — N139 Obstructive and reflux uropathy, unspecified: Secondary | ICD-10-CM | POA: Diagnosis present

## 2015-07-22 DIAGNOSIS — R339 Retention of urine, unspecified: Secondary | ICD-10-CM | POA: Diagnosis present

## 2015-07-22 DIAGNOSIS — G4730 Sleep apnea, unspecified: Secondary | ICD-10-CM | POA: Diagnosis present

## 2015-07-22 DIAGNOSIS — Z7951 Long term (current) use of inhaled steroids: Secondary | ICD-10-CM

## 2015-07-22 DIAGNOSIS — Z886 Allergy status to analgesic agent status: Secondary | ICD-10-CM

## 2015-07-22 DIAGNOSIS — F209 Schizophrenia, unspecified: Secondary | ICD-10-CM | POA: Diagnosis present

## 2015-07-22 DIAGNOSIS — R9401 Abnormal electroencephalogram [EEG]: Secondary | ICD-10-CM | POA: Diagnosis not present

## 2015-07-22 DIAGNOSIS — G8929 Other chronic pain: Secondary | ICD-10-CM | POA: Diagnosis present

## 2015-07-22 DIAGNOSIS — E119 Type 2 diabetes mellitus without complications: Secondary | ICD-10-CM | POA: Diagnosis present

## 2015-07-22 DIAGNOSIS — K047 Periapical abscess without sinus: Secondary | ICD-10-CM | POA: Diagnosis present

## 2015-07-22 DIAGNOSIS — R569 Unspecified convulsions: Secondary | ICD-10-CM

## 2015-07-22 DIAGNOSIS — E038 Other specified hypothyroidism: Secondary | ICD-10-CM | POA: Diagnosis not present

## 2015-07-22 DIAGNOSIS — G40409 Other generalized epilepsy and epileptic syndromes, not intractable, without status epilepticus: Principal | ICD-10-CM | POA: Diagnosis present

## 2015-07-22 DIAGNOSIS — E669 Obesity, unspecified: Secondary | ICD-10-CM | POA: Diagnosis present

## 2015-07-22 DIAGNOSIS — F13239 Sedative, hypnotic or anxiolytic dependence with withdrawal, unspecified: Secondary | ICD-10-CM | POA: Diagnosis present

## 2015-07-22 DIAGNOSIS — J45909 Unspecified asthma, uncomplicated: Secondary | ICD-10-CM | POA: Diagnosis present

## 2015-07-22 DIAGNOSIS — G629 Polyneuropathy, unspecified: Secondary | ICD-10-CM | POA: Diagnosis present

## 2015-07-22 DIAGNOSIS — Z6841 Body Mass Index (BMI) 40.0 and over, adult: Secondary | ICD-10-CM | POA: Diagnosis not present

## 2015-07-22 DIAGNOSIS — M1990 Unspecified osteoarthritis, unspecified site: Secondary | ICD-10-CM | POA: Diagnosis present

## 2015-07-22 DIAGNOSIS — J4520 Mild intermittent asthma, uncomplicated: Secondary | ICD-10-CM | POA: Diagnosis not present

## 2015-07-22 DIAGNOSIS — I110 Hypertensive heart disease with heart failure: Secondary | ICD-10-CM | POA: Diagnosis present

## 2015-07-22 DIAGNOSIS — E785 Hyperlipidemia, unspecified: Secondary | ICD-10-CM | POA: Diagnosis present

## 2015-07-22 DIAGNOSIS — Z888 Allergy status to other drugs, medicaments and biological substances status: Secondary | ICD-10-CM | POA: Diagnosis not present

## 2015-07-22 DIAGNOSIS — Z79899 Other long term (current) drug therapy: Secondary | ICD-10-CM

## 2015-07-22 DIAGNOSIS — E033 Postinfectious hypothyroidism: Secondary | ICD-10-CM | POA: Diagnosis not present

## 2015-07-22 DIAGNOSIS — J4531 Mild persistent asthma with (acute) exacerbation: Secondary | ICD-10-CM | POA: Diagnosis not present

## 2015-07-22 LAB — URINE MICROSCOPIC-ADD ON
BACTERIA UA: NONE SEEN
RBC / HPF: NONE SEEN RBC/hpf (ref 0–5)
Squamous Epithelial / LPF: NONE SEEN

## 2015-07-22 LAB — I-STAT VENOUS BLOOD GAS, ED
Acid-base deficit: 5 mmol/L — ABNORMAL HIGH (ref 0.0–2.0)
BICARBONATE: 19.2 meq/L — AB (ref 20.0–24.0)
O2 SAT: 96 %
PCO2 VEN: 33.1 mmHg — AB (ref 45.0–50.0)
TCO2: 20 mmol/L (ref 0–100)
pH, Ven: 7.372 — ABNORMAL HIGH (ref 7.250–7.300)
pO2, Ven: 86 mmHg — ABNORMAL HIGH (ref 30.0–45.0)

## 2015-07-22 LAB — CBC WITH DIFFERENTIAL/PLATELET
Basophils Absolute: 0 10*3/uL (ref 0.0–0.1)
Basophils Relative: 0 %
Eosinophils Absolute: 0 10*3/uL (ref 0.0–0.7)
Eosinophils Relative: 0 %
HCT: 44.9 % (ref 39.0–52.0)
HEMOGLOBIN: 14.9 g/dL (ref 13.0–17.0)
LYMPHS ABS: 1.4 10*3/uL (ref 0.7–4.0)
Lymphocytes Relative: 6 %
MCH: 26.4 pg (ref 26.0–34.0)
MCHC: 33.2 g/dL (ref 30.0–36.0)
MCV: 79.6 fL (ref 78.0–100.0)
MONOS PCT: 6 %
Monocytes Absolute: 1.2 10*3/uL — ABNORMAL HIGH (ref 0.1–1.0)
NEUTROS ABS: 19.5 10*3/uL — AB (ref 1.7–7.7)
NEUTROS PCT: 88 %
Platelets: 496 10*3/uL — ABNORMAL HIGH (ref 150–400)
RBC: 5.64 MIL/uL (ref 4.22–5.81)
RDW: 14.3 % (ref 11.5–15.5)
WBC: 22.2 10*3/uL — ABNORMAL HIGH (ref 4.0–10.5)

## 2015-07-22 LAB — COMPREHENSIVE METABOLIC PANEL
ALBUMIN: 4.2 g/dL (ref 3.5–5.0)
ALT: 86 U/L — AB (ref 17–63)
AST: 49 U/L — AB (ref 15–41)
Alkaline Phosphatase: 88 U/L (ref 38–126)
Anion gap: 16 — ABNORMAL HIGH (ref 5–15)
BUN: 14 mg/dL (ref 6–20)
CALCIUM: 9.8 mg/dL (ref 8.9–10.3)
CO2: 18 mmol/L — ABNORMAL LOW (ref 22–32)
Chloride: 100 mmol/L — ABNORMAL LOW (ref 101–111)
Creatinine, Ser: 0.82 mg/dL (ref 0.61–1.24)
GFR calc Af Amer: >60 mL/min (ref >60)
GFR calc non Af Amer: >60 mL/min (ref >60)
GLUCOSE: 221 mg/dL — AB (ref 65–99)
Potassium: 3.9 mmol/L (ref 3.5–5.1)
SODIUM: 134 mmol/L — AB (ref 135–145)
TOTAL PROTEIN: 8 g/dL (ref 6.5–8.1)
Total Bilirubin: 0.4 mg/dL (ref 0.3–1.2)

## 2015-07-22 LAB — I-STAT CHEM 8, ED
BUN: 17 mg/dL (ref 6–20)
CALCIUM ION: 1.15 mmol/L (ref 1.12–1.23)
CHLORIDE: 100 mmol/L — AB (ref 101–111)
Creatinine, Ser: 0.7 mg/dL (ref 0.61–1.24)
Glucose, Bld: 227 mg/dL — ABNORMAL HIGH (ref 65–99)
HCT: 53 % — ABNORMAL HIGH (ref 39.0–52.0)
Hemoglobin: 18 g/dL — ABNORMAL HIGH (ref 13.0–17.0)
POTASSIUM: 3.8 mmol/L (ref 3.5–5.1)
SODIUM: 136 mmol/L (ref 135–145)
TCO2: 19 mmol/L (ref 0–100)

## 2015-07-22 LAB — URINALYSIS, ROUTINE W REFLEX MICROSCOPIC
GLUCOSE, UA: 100 mg/dL — AB
Hgb urine dipstick: NEGATIVE
KETONES UR: 15 mg/dL — AB
Leukocytes, UA: NEGATIVE
Nitrite: NEGATIVE
PROTEIN: 100 mg/dL — AB
Specific Gravity, Urine: 1.025 (ref 1.005–1.030)
pH: 6 (ref 5.0–8.0)

## 2015-07-22 LAB — ETHANOL: Alcohol, Ethyl (B): <5 mg/dL (ref <5)

## 2015-07-22 LAB — RAPID URINE DRUG SCREEN, HOSP PERFORMED
Amphetamines: NOT DETECTED
Barbiturates: NOT DETECTED
Benzodiazepines: POSITIVE — AB
Cocaine: NOT DETECTED
Opiates: POSITIVE — AB
Tetrahydrocannabinol: NOT DETECTED

## 2015-07-22 LAB — TROPONIN I: Troponin I: <0.03 ng/mL (ref <0.031)

## 2015-07-22 LAB — SALICYLATE LEVEL: Salicylate Lvl: <4 mg/dL (ref 2.8–30.0)

## 2015-07-22 LAB — ACETAMINOPHEN LEVEL

## 2015-07-22 LAB — LACTIC ACID, PLASMA: Lactic Acid, Venous: 2.6 mmol/L (ref 0.5–2.0)

## 2015-07-22 MED ORDER — INSULIN ASPART 100 UNIT/ML ~~LOC~~ SOLN
0.0000 [IU] | Freq: Three times a day (TID) | SUBCUTANEOUS | Status: DC
  Administered 2015-07-23 – 2015-07-27 (×6): 1 [IU] via SUBCUTANEOUS

## 2015-07-22 MED ORDER — CLINDAMYCIN PHOSPHATE 900 MG/50ML IV SOLN
900.0000 mg | Freq: Three times a day (TID) | INTRAVENOUS | Status: DC
Start: 2015-07-22 — End: 2015-07-25
  Administered 2015-07-22 – 2015-07-25 (×8): 900 mg via INTRAVENOUS
  Filled 2015-07-22 (×11): qty 50

## 2015-07-22 MED ORDER — SODIUM CHLORIDE 0.9 % IV SOLN
75.0000 mL/h | INTRAVENOUS | Status: DC
  Administered 2015-07-22 – 2015-07-23 (×2): 75 mL/h via INTRAVENOUS

## 2015-07-22 MED ORDER — FENOFIBRATE 160 MG PO TABS
160.0000 mg | ORAL_TABLET | Freq: Every day | ORAL | Status: DC
  Administered 2015-07-23 – 2015-07-27 (×5): 160 mg via ORAL
  Filled 2015-07-22 (×6): qty 1

## 2015-07-22 MED ORDER — ALBUTEROL SULFATE HFA 108 (90 BASE) MCG/ACT IN AERS: 2.0000 | INHALATION_SPRAY | RESPIRATORY_TRACT | Status: DC | PRN

## 2015-07-22 MED ORDER — SODIUM CHLORIDE 0.9 % IV BOLUS (SEPSIS): 1000.0000 mL | Freq: Once | INTRAVENOUS | Status: DC

## 2015-07-22 MED ORDER — LORAZEPAM 2 MG/ML IJ SOLN: 1.0000 mg | INTRAMUSCULAR | Status: DC | PRN

## 2015-07-22 MED ORDER — MELATONIN 3 MG PO TABS
6.0000 mg | ORAL_TABLET | Freq: Every day | ORAL | Status: DC
  Administered 2015-07-23 – 2015-07-26 (×4): 6 mg via ORAL
  Filled 2015-07-22 (×6): qty 2

## 2015-07-22 MED ORDER — ONDANSETRON HCL 4 MG/2ML IJ SOLN
4.0000 mg | Freq: Four times a day (QID) | INTRAMUSCULAR | Status: DC | PRN
Start: 2015-07-22 — End: 2015-07-27
  Administered 2015-07-22: 4 mg via INTRAVENOUS
  Filled 2015-07-22: qty 2

## 2015-07-22 MED ORDER — PANTOPRAZOLE SODIUM 40 MG IV SOLR
40.0000 mg | INTRAVENOUS | Status: DC
  Administered 2015-07-22 – 2015-07-23 (×2): 40 mg via INTRAVENOUS
  Filled 2015-07-22 (×2): qty 40

## 2015-07-22 MED ORDER — VENLAFAXINE HCL ER 75 MG PO CP24
75.0000 mg | ORAL_CAPSULE | Freq: Two times a day (BID) | ORAL | Status: DC
  Administered 2015-07-23 – 2015-07-27 (×9): 75 mg via ORAL
  Filled 2015-07-22 (×10): qty 1

## 2015-07-22 MED ORDER — BENZOCAINE 20 % MT SOLN
Freq: Three times a day (TID) | OROMUCOSAL | Status: DC | PRN
  Filled 2015-07-22: qty 57

## 2015-07-22 MED ORDER — FUROSEMIDE 20 MG PO TABS: 40.0000 mg | ORAL_TABLET | Freq: Two times a day (BID) | ORAL | Status: DC

## 2015-07-22 MED ORDER — LEVOTHYROXINE SODIUM 100 MCG PO TABS
100.0000 ug | ORAL_TABLET | Freq: Every day | ORAL | Status: DC
  Administered 2015-07-23 – 2015-07-27 (×5): 100 ug via ORAL
  Filled 2015-07-22 (×7): qty 1

## 2015-07-22 MED ORDER — ENOXAPARIN SODIUM 80 MG/0.8ML ~~LOC~~ SOLN
80.0000 mg | SUBCUTANEOUS | Status: DC
Start: 2015-07-23 — End: 2015-07-27
  Administered 2015-07-23 – 2015-07-27 (×5): 80 mg via SUBCUTANEOUS
  Filled 2015-07-22 (×6): qty 0.8

## 2015-07-22 MED ORDER — ONDANSETRON HCL 4 MG/2ML IJ SOLN
4.0000 mg | Freq: Once | INTRAMUSCULAR | Status: AC
  Administered 2015-07-22: 4 mg via INTRAVENOUS
  Filled 2015-07-22: qty 2

## 2015-07-22 MED ORDER — ALPRAZOLAM 0.5 MG PO TABS
1.0000 mg | ORAL_TABLET | Freq: Four times a day (QID) | ORAL | Status: DC
  Administered 2015-07-23 – 2015-07-25 (×9): 1 mg via ORAL
  Filled 2015-07-22 (×9): qty 2

## 2015-07-22 MED ORDER — SODIUM CHLORIDE 0.9 % IV SOLN
1500.0000 mg | Freq: Once | INTRAVENOUS | Status: AC
  Administered 2015-07-22: 1500 mg via INTRAVENOUS
  Filled 2015-07-22: qty 15

## 2015-07-22 MED ORDER — SPIRONOLACTONE 25 MG PO TABS
50.0000 mg | ORAL_TABLET | Freq: Every day | ORAL | Status: DC
  Administered 2015-07-23 – 2015-07-27 (×5): 50 mg via ORAL
  Filled 2015-07-22 (×2): qty 2
  Filled 2015-07-22 (×2): qty 1
  Filled 2015-07-22 (×2): qty 2

## 2015-07-22 MED ORDER — LORAZEPAM 2 MG/ML IJ SOLN
1.0000 mg | Freq: Once | INTRAMUSCULAR | Status: AC
  Administered 2015-07-22: 1 mg via INTRAVENOUS
  Filled 2015-07-22: qty 1

## 2015-07-22 MED ORDER — ASPIRIN 81 MG PO CHEW: 324.0000 mg | CHEWABLE_TABLET | Freq: Once | ORAL | Status: DC

## 2015-07-22 MED ORDER — MELATONIN 5 MG PO TABS: 5.0000 mg | ORAL_TABLET | Freq: Every day | ORAL | Status: DC

## 2015-07-22 MED ORDER — LACTULOSE 10 GM/15ML PO SOLN
10.0000 g | Freq: Three times a day (TID) | ORAL | Status: DC
  Filled 2015-07-22 (×8): qty 15

## 2015-07-22 MED ORDER — CITALOPRAM HYDROBROMIDE 20 MG PO TABS
20.0000 mg | ORAL_TABLET | Freq: Two times a day (BID) | ORAL | Status: DC
  Administered 2015-07-23 – 2015-07-27 (×9): 20 mg via ORAL
  Filled 2015-07-22 (×7): qty 1
  Filled 2015-07-22: qty 2
  Filled 2015-07-22: qty 1

## 2015-07-22 MED ORDER — LORAZEPAM 2 MG/ML IJ SOLN
1.0000 mg | INTRAMUSCULAR | Status: DC | PRN
  Administered 2015-07-23: 1 mg via INTRAVENOUS
  Filled 2015-07-22: qty 1

## 2015-07-22 MED ORDER — MORPHINE SULFATE (PF) 2 MG/ML IV SOLN
1.0000 mg | INTRAVENOUS | Status: DC | PRN
  Administered 2015-07-22: 1 mg via INTRAVENOUS
  Filled 2015-07-22: qty 1

## 2015-07-22 MED ORDER — LISINOPRIL 20 MG PO TABS
10.0000 mg | ORAL_TABLET | Freq: Every day | ORAL | Status: DC
  Administered 2015-07-23 – 2015-07-27 (×5): 10 mg via ORAL
  Filled 2015-07-22 (×5): qty 1

## 2015-07-22 NOTE — ED Notes (Signed)
MD at bedside. 

## 2015-07-22 NOTE — ED Provider Notes (Signed)
CSN: [id number]     Arrival date & time 07/22/15  1515 History   First MD Initiated Contact with Patient 07/22/15 1516     CC: AMS, seizure activity   Patient is a 50 y.o. male presenting with altered mental status. The history is provided by the patient and the EMS personnel. No language interpreter was used.  Altered Mental Status Presenting symptoms: confusion, disorientation and partial responsiveness   Severity:  Severe Most recent episode:  Today Episode history:  Single Duration:  4 hours Timing:  Constant Progression:  Worsening Chronicity:  New Context: not alcohol use, not head injury, not a recent change in medication, not a recent illness and not a recent infection   Associated symptoms: abnormal movement, bladder incontinence, nausea and seizures   Associated symptoms: no fever, no headaches and no vomiting     Past Medical History  Diagnosis Date  . Hypertension   . CHF (congestive heart failure) (HCC)     Diastolic  . Arthritis   . Hyperlipemia   . Sleep apnea     He wore CPAP once  . Asthma   . Schizophrenia ([place])   . Hypothyroidism   . Bipolar 1 disorder ([place])   . Ejection fraction     EF 50-55%, March, 2012, technically difficult study, some RV dilatation  . Venous insufficiency   . Lithium toxicity     April, 2012  . Edema     Secondary to diastolic CHF  . Obesity   . Statin intolerance     Muscle aches from statins in the past  . Manic depression ([place])   . Diabetes ([place])   . Peripheral neuropathy [facility])    Past Surgical History  Procedure Laterality Date  . Tonsillectomy     History reviewed. No pertinent family history. Social History  Substance Use Topics  . Smoking status: Former Smoker -- 3.00 packs/day for 20 years    Types: Cigarettes    Start date: 03/14/1984    Quit date: 05/21/2015  . Smokeless tobacco: Never Used     Comment: 09-12-13 cut back to 2 pk daily  . Alcohol Use: No    Review of Systems  Unable to perform ROS: Mental  status change  Constitutional: Negative for fever.  Gastrointestinal: Positive for nausea. Negative for vomiting.  Genitourinary: Positive for bladder incontinence.  Neurological: Positive for seizures. Negative for headaches.  Psychiatric/Behavioral: Positive for confusion.     Allergies  Aspirin; Statins; and Penicillins  Home Medications   Prior to Admission medications   Medication Sig Start Date End Date Taking? Authorizing Provider  acetaminophen (TYLENOL) 500 MG tablet Take 1,000 mg by mouth every 6 (six) hours as needed (pain).    Yes Historical Provider, MD  albuterol (PROVENTIL HFA;VENTOLIN HFA) 108 (90 BASE) MCG/ACT inhaler Inhale 2 puffs into the lungs every 4 (four) hours as needed for wheezing.   Yes Historical Provider, MD  ALPRAZolam Duanne Moron) 1 MG tablet Take 1 tablet (1 mg total) by mouth 3 (three) times daily as needed for anxiety. Patient taking differently: Take 1 mg by mouth 4 (four) times daily.  05/27/14  Yes Kinnie Feil, MD  Calcium Carbonate Antacid (ANTACID PO) Take 1 tablet by mouth daily as needed (heartburn).   Yes Historical Provider, MD  cholecalciferol (VITAMIN D) 1000 units tablet Take 1,000 Units by mouth daily.   Yes Historical Provider, MD  citalopram (CELEXA) 20 MG tablet Take 20 mg by mouth 2 (two) times daily.  Yes Historical Provider, MD  Cyanocobalamin (VITAMIN B-12 PO) Take 1 tablet by mouth daily.   Yes Historical Provider, MD  dicyclomine (BENTYL) 20 MG tablet Take 20 mg by mouth 4 times daily.  09/23/11  Yes Historical Provider, MD  fenofibrate 160 MG tablet Take 160 mg by mouth daily.   Yes Historical Provider, MD  furosemide (LASIX) 80 MG tablet Take 40-80 mg by mouth 2 (two) times daily.   Yes Historical Provider, MD  lactulose (CHRONULAC) 10 GM/15ML solution Take 10 g by mouth 3 (three) times daily.   Yes Historical Provider, MD  levothyroxine (SYNTHROID, LEVOTHROID) 100 MCG tablet Take 100 mcg by mouth every morning.    Yes Historical  Provider, MD  lisinopril (PRINIVIL,ZESTRIL) 10 MG tablet Take 10 mg by mouth daily.   Yes Historical Provider, MD  Melatonin 5 MG TABS Take 5 mg by mouth at bedtime.   Yes Historical Provider, MD  Omega-3 Fatty Acids (FISH OIL PO) Take 1 tablet by mouth daily.   Yes Historical Provider, MD  pantoprazole (PROTONIX) 40 MG tablet Take 40 mg by mouth every evening.   Yes Historical Provider, MD  polyethylene glycol (MIRALAX / GLYCOLAX) packet Take 17 g by mouth daily as needed for mild constipation.   Yes Historical Provider, MD  promethazine (PHENERGAN) 25 MG tablet Take 25 mg by mouth every 6 (six) hours as needed for nausea or vomiting.   Yes Historical Provider, MD  ranitidine (ZANTAC) 150 MG capsule Take 150-300 mg by mouth daily as needed for heartburn.   Yes Historical Provider, MD  saxagliptin HCl (ONGLYZA) 5 MG TABS tablet Take 1 tablet (5 mg total) by mouth daily. 05/27/14  Yes Kinnie Feil, MD  spironolactone (ALDACTONE) 50 MG tablet Take 50 mg by mouth daily.   Yes Historical Provider, MD  venlafaxine XR (EFFEXOR-XR) 75 MG 24 hr capsule Take 1 capsule (75 mg total) by mouth 2 (two) times daily. Patient taking differently: Take 75 mg by mouth 3 (three) times daily.  05/27/14  Yes Kinnie Feil, MD   BP 123/73 mmHg  Pulse 102  Temp(Src) 99.2 F (37.3 C) (Rectal)  Resp 19  Wt 178.6 kg  SpO2 95% Physical Exam  Constitutional: He appears well-developed and well-nourished. He appears listless. No distress.  HENT:  Head: Normocephalic and atraumatic.  Right Ear: External ear normal.  Left Ear: External ear normal.  Eyes: Pupils are equal, round, and reactive to light. Right eye exhibits no discharge. Left eye exhibits no discharge.  Neck: Normal range of motion. No JVD present. No tracheal deviation present.  Cardiovascular: Regular rhythm, normal heart sounds and intact distal pulses.  Tachycardia present.  Exam reveals no friction rub.   No murmur heard. Pulmonary/Chest:  Effort normal and breath sounds normal. No stridor. No respiratory distress. He has no wheezes.  Abdominal: Soft. Bowel sounds are normal. He exhibits no distension. There is no tenderness. There is no rebound and no guarding.  Musculoskeletal: Normal range of motion. He exhibits no edema or tenderness.  Lymphadenopathy:    He has no cervical adenopathy.  Neurological: He appears listless. No cranial nerve deficit or sensory deficit. He exhibits normal muscle tone. Coordination normal. GCS eye subscore is 4. GCS verbal subscore is 4. GCS motor subscore is 6.  Diffuse upper and lower extremity weakness without focality. Patient able to follow commands but unable to form a complete sentence. No seizure activity upon arrival. No facial droop.  Skin: Skin is warm and dry.  No rash noted. No pallor.  Psychiatric: He has a normal mood and affect. His behavior is normal. Judgment and thought content normal.  Nursing note and vitals reviewed.   ED Course  Procedures (including critical care time) Labs Review Labs Reviewed  COMPREHENSIVE METABOLIC PANEL - Abnormal; Notable for the following:    Sodium 134 (*)    Chloride 100 (*)    CO2 18 (*)    Glucose, Bld 221 (*)    AST 49 (*)    ALT 86 (*)    Anion gap 16 (*)    All other components within normal limits  LACTIC ACID, PLASMA - Abnormal; Notable for the following:    Lactic Acid, Venous 2.6 (*)    All other components within normal limits  CBC WITH DIFFERENTIAL/PLATELET - Abnormal; Notable for the following:    WBC 22.2 (*)    Platelets 496 (*)    Neutro Abs 19.5 (*)    Monocytes Absolute 1.2 (*)    All other components within normal limits  URINALYSIS, ROUTINE W REFLEX MICROSCOPIC (NOT AT [facility]) - Abnormal; Notable for the following:    Color, Urine AMBER (*)    APPearance TURBID (*)    Glucose, UA 100 (*)    Bilirubin Urine SMALL (*)    Ketones, ur 15 (*)    Protein, ur 100 (*)    All other components within normal limits  URINE  RAPID DRUG SCREEN, HOSP PERFORMED - Abnormal; Notable for the following:    Opiates POSITIVE (*)    Benzodiazepines POSITIVE (*)    All other components within normal limits  ACETAMINOPHEN LEVEL - Abnormal; Notable for the following:    Acetaminophen (Tylenol), Serum <10 (*)    All other components within normal limits  I-STAT CHEM 8, ED - Abnormal; Notable for the following:    Chloride 100 (*)    Glucose, Bld 227 (*)    Hemoglobin 18.0 (*)    HCT 53.0 (*)    All other components within normal limits  I-STAT VENOUS BLOOD GAS, ED - Abnormal; Notable for the following:    pH, Ven 7.372 (*)    pCO2, Ven 33.1 (*)    pO2, Ven 86.0 (*)    Bicarbonate 19.2 (*)    Acid-base deficit 5.0 (*)    All other components within normal limits  ETHANOL  TROPONIN I  URINE MICROSCOPIC-ADD ON  SALICYLATE LEVEL  LACTIC ACID, PLASMA  BLOOD GAS, VENOUS  COMPREHENSIVE METABOLIC PANEL  CBC    Imaging Review Ct Head Wo Contrast  07/22/2015  CLINICAL DATA:  Altered mental status. Seizure. Head and face bleeding. Unknown injury. EXAM: CT HEAD WITHOUT CONTRAST TECHNIQUE: Contiguous axial images were obtained from the base of the skull through the vertex without intravenous contrast. COMPARISON:  03/06/2011. FINDINGS: Diffusely enlarged ventricles and subarachnoid spaces. No skull fracture, intracranial hemorrhage, mass lesion, CT evidence of acute infarction or paranasal sinus air-fluid levels. Left ethmoid and bilateral maxillary sinus retention cysts. Multiple missing anterior teeth. There is also periapical lucency involving a remaining right upper anterior tooth. IMPRESSION: 1. No skull fracture or intracranial hemorrhage. 2. Stable mild diffuse cerebral atrophy. 3. Multiple missing upper anterior teeth. A remaining anterior right upper tooth has periapical lucency, suggesting a periapical abscess or dentigerous cyst. Electronically Signed   By: Claudie Revering M.D.   On: 07/22/2015 15:42   Dg Chest Portable 1  View  07/22/2015  CLINICAL DATA:  Initial evaluation for seizure, elevated white blood  cell count, evaluate for possible aspiration. EXAM: PORTABLE CHEST 1 VIEW COMPARISON:  Prior radiograph from 05/22/2014. FINDINGS: Cardiac and mediastinal silhouettes are stable in size and contour, and remain within normal limits. Lungs are normally inflated. No focal infiltrates to suggest infectious pneumonitis or aspiration. No pulmonary edema. No definite pleural effusion, although costophrenic angles are incompletely visualized. No pneumothorax. No acute osseus abnormality para IMPRESSION: No active cardiopulmonary disease. No radiographic evidence for pneumonia or aspiration pneumonitis. Electronically Signed   By: Jeannine Boga M.D.   On: 07/22/2015 20:54   I have personally reviewed and evaluated these images and lab results as part of my medical decision-making.   EKG Interpretation   Date/Time:  Sunday July 22 2015 15:16:38 EST Ventricular Rate:  106 PR Interval:  168 QRS Duration: 110 QT Interval:  363 QTC Calculation: 482 R Axis:   -18 Text Interpretation:  Sinus tachycardia Borderline left axis deviation Low  voltage, precordial leads RSR' in V1 or V2, probably normal variant  Borderline prolonged QT interval No significant change since last tracing  Confirmed by Gerald Leitz ([id number]) on 07/22/2015 3:21:03 PM      MDM   Final diagnoses:  Seizure [facility])    Patient presents via EMS for evaluation of seizure-like activity. Patient has extensive past medical history including schizophrenia, bipolar disorder, CHF, chronic pain, past renal failure who had witnessed seizure-like activity for roughly 5 minutes at home. He is a coming by his wife who states the patient had eye rolling, generalized shaking and foaming at the mouth. He was postictal.  No seizure activity upon arrival to ED. Patient confused and altered. He is able to protect his airway was following commands unable to  speak in full sentences.  Patient afebrile, heart rate low 100s, blood pressure stable.  CT head with no acute abnormalities.. Chest x-ray with no acute cardopulmonary disease.  8:39 PM: Called to bedside by nursing staff for generalized tonic-clonic seizure activity. Patient given 1 mg Ativan the seizure activity stopped. Ordered 1500 mg Keppra to load. Discussed case with hospitalist who will admit patient to stepdown unit.  Discussed with Dr. Thomasene Lot.    Vira Blanco, MD 07/22/15 2344  Courteney Julio Alm, MD 07/23/15 [id number]

## 2015-07-22 NOTE — H&P (Signed)
History and Physical  [name] [id number] DOB: 1966/01/06 DOA: 07/22/2015  PCP: Candi Leash   Chief Complaint: seizure  History of Present Illness:  Patient is a 50 yo male with history of DM, HTN, CHF, HLD, obstructive uropathy, depression dependent on Xanax 1 mg QID who came with cc of seizure that he had tonight while laying down in his hospital bed at home. He stopped Xanax yesterday afternoon abruptly. He did loose control over his bowels and urine and bit his tongue. He was post ictal after the seizure. He was alert en route to the hospital. In the ER he had another tonic clonic seizure as well. He complained of pain in his mouth. Otherwise he had no complaints.   Review of Systems:  CONSTITUTIONAL:  No night sweats.  No fatigue, malaise, lethargy.  No fever or chills. Eyes:  No visual changes.  No eye pain.  No eye discharge.   ENT:    No epistaxis.  No sinus pain.  No sore throat.  No ear pain.  No congestion. RESPIRATORY:  No cough.  No wheeze.  No hemoptysis.  No shortness of breath. CARDIOVASCULAR:  No chest pains.  No palpitations. GASTROINTESTINAL:  No abdominal pain.  No nausea or vomiting.  No diarrhea or constipation.  No hematemesis.  No hematochezia.  No melena. GENITOURINARY:  No urgency.  No frequency.  No dysuria.  No hematuria.  No obstructive symptoms.  No discharge.  No pain.  No significant abnormal bleeding. MUSCULOSKELETAL:  No musculoskeletal pain.  No joint swelling.  No arthritis. NEUROLOGICAL:  +confusion.  No weakness. No headache. +seizure. PSYCHIATRIC:  +depression. +anxiety. No suicidal ideation. SKIN:  +rashes.  No lesions.  No wounds. ENDOCRINE:  No unexplained weight loss.  No polydipsia.  No polyuria.  No polyphagia. HEMATOLOGIC:  No anemia.  No purpura.  No petechiae.  No bleeding.  ALLERGIC AND IMMUNOLOGIC:  No pruritus.  No swelling Other:  Past Medical and Surgical History:   Past Medical History  Diagnosis Date  .  Hypertension   . CHF (congestive heart failure) (HCC)     Diastolic  . Arthritis   . Hyperlipemia   . Sleep apnea     He wore CPAP once  . Asthma   . Schizophrenia ([place])   . Hypothyroidism   . Bipolar 1 disorder ([place])   . Ejection fraction     EF 50-55%, March, 2012, technically difficult study, some RV dilatation  . Venous insufficiency   . Lithium toxicity     April, 2012  . Edema     Secondary to diastolic CHF  . Obesity   . Statin intolerance     Muscle aches from statins in the past  . Manic depression ([place])   . Diabetes ([place])   . Peripheral neuropathy [facility])    Past Surgical History  Procedure Laterality Date  . Tonsillectomy      Social History:   reports that he quit smoking about 2 months ago. His smoking use included Cigarettes. He started smoking about 31 years ago. He has a 60 pack-year smoking history. He has never used smokeless tobacco. He reports that he does not drink alcohol or use illicit drugs.   Allergies  Allergen Reactions  . Aspirin Other (See Comments)    Causes asthmatic symptoms  . Statins     Joint pain, muscle weakness, atropy   . Penicillins Rash    History reviewed. No pertinent family history.    Prior to  Admission medications   Medication Sig Start Date End Date Taking? Authorizing Provider  acetaminophen (TYLENOL) 500 MG tablet Take 1,000 mg by mouth every 6 (six) hours as needed (pain).    Yes Historical Provider, MD  albuterol (PROVENTIL HFA;VENTOLIN HFA) 108 (90 BASE) MCG/ACT inhaler Inhale 2 puffs into the lungs every 4 (four) hours as needed for wheezing.   Yes Historical Provider, MD  ALPRAZolam Duanne Moron) 1 MG tablet Take 1 tablet (1 mg total) by mouth 3 (three) times daily as needed for anxiety. Patient taking differently: Take 1 mg by mouth 4 (four) times daily.  05/27/14  Yes Kinnie Feil, MD  Calcium Carbonate Antacid (ANTACID PO) Take 1 tablet by mouth daily as needed (heartburn).   Yes Historical Provider, MD    cholecalciferol (VITAMIN D) 1000 units tablet Take 1,000 Units by mouth daily.   Yes Historical Provider, MD  citalopram (CELEXA) 20 MG tablet Take 20 mg by mouth 2 (two) times daily.   Yes Historical Provider, MD  Cyanocobalamin (VITAMIN B-12 PO) Take 1 tablet by mouth daily.   Yes Historical Provider, MD  dicyclomine (BENTYL) 20 MG tablet Take 20 mg by mouth 4 times daily.  09/23/11  Yes Historical Provider, MD  fenofibrate 160 MG tablet Take 160 mg by mouth daily.   Yes Historical Provider, MD  furosemide (LASIX) 80 MG tablet Take 40-80 mg by mouth 2 (two) times daily.   Yes Historical Provider, MD  lactulose (CHRONULAC) 10 GM/15ML solution Take 10 g by mouth 3 (three) times daily.   Yes Historical Provider, MD  levothyroxine (SYNTHROID, LEVOTHROID) 100 MCG tablet Take 100 mcg by mouth every morning.    Yes Historical Provider, MD  lisinopril (PRINIVIL,ZESTRIL) 10 MG tablet Take 10 mg by mouth daily.   Yes Historical Provider, MD  Melatonin 5 MG TABS Take 5 mg by mouth at bedtime.   Yes Historical Provider, MD  Omega-3 Fatty Acids (FISH OIL PO) Take 1 tablet by mouth daily.   Yes Historical Provider, MD  pantoprazole (PROTONIX) 40 MG tablet Take 40 mg by mouth every evening.   Yes Historical Provider, MD  polyethylene glycol (MIRALAX / GLYCOLAX) packet Take 17 g by mouth daily as needed for mild constipation.   Yes Historical Provider, MD  promethazine (PHENERGAN) 25 MG tablet Take 25 mg by mouth every 6 (six) hours as needed for nausea or vomiting.   Yes Historical Provider, MD  ranitidine (ZANTAC) 150 MG capsule Take 150-300 mg by mouth daily as needed for heartburn.   Yes Historical Provider, MD  saxagliptin HCl (ONGLYZA) 5 MG TABS tablet Take 1 tablet (5 mg total) by mouth daily. 05/27/14  Yes Kinnie Feil, MD  spironolactone (ALDACTONE) 50 MG tablet Take 50 mg by mouth daily.   Yes Historical Provider, MD  venlafaxine XR (EFFEXOR-XR) 75 MG 24 hr capsule Take 1 capsule (75 mg total) by  mouth 2 (two) times daily. Patient taking differently: Take 75 mg by mouth 3 (three) times daily.  05/27/14  Yes Kinnie Feil, MD    Physical Exam: BP 122/69 mmHg  Pulse 101  Temp(Src) 99.2 F (37.3 C) (Rectal)  Resp 17  Wt 191.418 kg (422 lb)  SpO2 96%  GENERAL : Well developed, well nourished, alert and cooperative, and appears to be in no acute distress. HEAD: normocephalic. EYES: dilated and sluggish pupils.  NOSE: No nasal discharge. THROAT: Oral cavity bloody due to biting his tongue twice. I did not see any large lacerations.  NECK: Neck supple, CARDIAC: Normal S1 and S2. No S3, S4 or murmurs. Rhythm is regular. There is no peripheral edema, LUNGS: Clear to auscultation  ABDOMEN: Positive bowel sounds. Soft, nondistended, nontender. No guarding or rebound. No masses. NEUROLOGICAL: The mental examination revealed the patient was oriented to person, place, and time.CN II-XII intact. Strength symmetric and intact throughout. Lower extremitas with neuropathy, painful to touch, tingling, both feet cold to touch. SKIN: Skin normal color PSYCHIATRIC:  Denied suicidal ideation.          Labs on Admission:  Reviewed.   Radiological Exams on Admission: Ct Head Wo Contrast  07/22/2015  CLINICAL DATA:  Altered mental status. Seizure. Head and face bleeding. Unknown injury. EXAM: CT HEAD WITHOUT CONTRAST TECHNIQUE: Contiguous axial images were obtained from the base of the skull through the vertex without intravenous contrast. COMPARISON:  03/06/2011. FINDINGS: Diffusely enlarged ventricles and subarachnoid spaces. No skull fracture, intracranial hemorrhage, mass lesion, CT evidence of acute infarction or paranasal sinus air-fluid levels. Left ethmoid and bilateral maxillary sinus retention cysts. Multiple missing anterior teeth. There is also periapical lucency involving a remaining right upper anterior tooth. IMPRESSION: 1. No skull fracture or intracranial hemorrhage. 2. Stable mild  diffuse cerebral atrophy. 3. Multiple missing upper anterior teeth. A remaining anterior right upper tooth has periapical lucency, suggesting a periapical abscess or dentigerous cyst. Electronically Signed   By: Claudie Revering M.D.   On: 07/22/2015 15:42   Dg Chest Portable 1 View  07/22/2015  CLINICAL DATA:  Initial evaluation for seizure, elevated white blood cell count, evaluate for possible aspiration. EXAM: PORTABLE CHEST 1 VIEW COMPARISON:  Prior radiograph from 05/22/2014. FINDINGS: Cardiac and mediastinal silhouettes are stable in size and contour, and remain within normal limits. Lungs are normally inflated. No focal infiltrates to suggest infectious pneumonitis or aspiration. No pulmonary edema. No definite pleural effusion, although costophrenic angles are incompletely visualized. No pneumothorax. No acute osseus abnormality para IMPRESSION: No active cardiopulmonary disease. No radiographic evidence for pneumonia or aspiration pneumonitis. Electronically Signed   By: Jeannine Boga M.D.   On: 07/22/2015 20:54     Assessment/Plan  Seizure New onset, likely withdrawal to Xanax Will restart xanax Admitted to step down with seizure precautions Got Keppra in the ER, will not continue for now as likely he had provoked seizures May consult with neuro in am  Continue ativan prn seizure CT head neg for acute events Will check EEG  HTN: cont home meds  DM: glycemic protocol, low dose correction.  CHF: compensated, will hold lasix for now  Will check bladder scan.  CT head showing possible periapical abscess, will start Unasyn. Consult with maxofacial surgery in am. Leukocytosis likely due to seizure, will monitor.    DVT prophylaxis:  enoxaparin  GI prophylaxis:PPI Consultants: neuro in am possibly. Oral surgery in am.  Code Status: Full     Gennaro Africa M.D [facility]

## 2015-07-22 NOTE — ED Notes (Signed)
Placed on hospital bed at this time 

## 2015-07-22 NOTE — Progress Notes (Addendum)
Pharmacy Antibiotic Note  and pharmacy consult for  Drug interaction and monitoring of antiepileptic medications  [name] is a 50 y.o. male admitted on 07/22/2015 with tooth abscess. CT head showing possible periapical abscess,   Pharmacy has been consulted for Unasyn dosing.  Allergic to PCN causes rash. Patient is unable to answer any questions at this time for specific questions regarding if ever had anaphylatic reaction to PCN.  I called to discuss with Dr. Dreama Saa. Canceled order for Unasyn.  Dr. Dreama Saa changing antibiotic to Clindamycin.   SCr 0.7  Weight 178.6 kg   Patient not on any antiepileptic medications PTA.  She received Keppra 1500 mg IV x1 in ED tonight.  No drug interactions noted with current medications ordered and Keppra.   Plan: Clindamycin 900mg  IV q8h.    Weight: (!) 393 lb 11.9 oz (178.6 kg)  Temp (24hrs), Avg:99.2 F (37.3 C), Min:99.2 F (37.3 C), Max:99.2 F (37.3 C)   Recent Labs Lab 07/22/15 1553 07/22/15 1608  WBC 22.2*  --   CREATININE 0.82 0.70  LATICACIDVEN 2.6*  --     CrCl cannot be calculated (Unknown ideal weight.).    Allergies  Allergen Reactions  . Aspirin Other (See Comments)    Causes asthmatic symptoms  . Statins     Joint pain, muscle weakness, atropy   . Penicillins Rash    Antimicrobials this admission: None given yet. Starting Clindamycin   Thank you for allowing pharmacy to be a part of this patient's care.  Nicole Cella, RPh Clinical Pharmacist Pager: [phone] 07/22/2015 9:58 PM

## 2015-07-22 NOTE — ED Notes (Signed)
~  90 second seizure witnessed by RN. Oral trauma present. Looks off to the left and some jerking. Ativan given via IV, placed on nonrebreather. Family member sticking fingers in mouth. Snoring respirations at this time.

## 2015-07-22 NOTE — ED Notes (Signed)
[facility] called by pt's wife for seizure.  Apparently pt had not been eating or or urinated well for past 2 days.  Today he had approx 30 sec "all body shaking" seizure.  When EMS arrived pt was unable to state age, etc. CBG 191.

## 2015-07-23 ENCOUNTER — Encounter (HOSPITAL_COMMUNITY): Payer: Self-pay | Admitting: Neurology

## 2015-07-23 ENCOUNTER — Inpatient Hospital Stay (HOSPITAL_COMMUNITY): Payer: Medicare Other

## 2015-07-23 DIAGNOSIS — R9401 Abnormal electroencephalogram [EEG]: Secondary | ICD-10-CM

## 2015-07-23 DIAGNOSIS — R569 Unspecified convulsions: Secondary | ICD-10-CM

## 2015-07-23 LAB — COMPREHENSIVE METABOLIC PANEL
ALK PHOS: 81 U/L (ref 38–126)
ALT: 78 U/L — ABNORMAL HIGH (ref 17–63)
AST: 40 U/L (ref 15–41)
Albumin: 4.2 g/dL (ref 3.5–5.0)
Anion gap: 16 — ABNORMAL HIGH (ref 5–15)
BILIRUBIN TOTAL: 0.5 mg/dL (ref 0.3–1.2)
BUN: 12 mg/dL (ref 6–20)
CALCIUM: 9.8 mg/dL (ref 8.9–10.3)
CO2: 21 mmol/L — ABNORMAL LOW (ref 22–32)
Chloride: 101 mmol/L (ref 101–111)
Creatinine, Ser: 0.8 mg/dL (ref 0.61–1.24)
GFR calc non Af Amer: >60 mL/min (ref >60)
Glucose, Bld: 123 mg/dL — ABNORMAL HIGH (ref 65–99)
Potassium: 3.9 mmol/L (ref 3.5–5.1)
SODIUM: 138 mmol/L (ref 135–145)
TOTAL PROTEIN: 8.4 g/dL — AB (ref 6.5–8.1)

## 2015-07-23 LAB — CBC
HCT: 44 % (ref 39.0–52.0)
HEMOGLOBIN: 14.7 g/dL (ref 13.0–17.0)
MCH: 26.7 pg (ref 26.0–34.0)
MCHC: 33.4 g/dL (ref 30.0–36.0)
MCV: 80 fL (ref 78.0–100.0)
Platelets: 456 10*3/uL — ABNORMAL HIGH (ref 150–400)
RBC: 5.5 MIL/uL (ref 4.22–5.81)
RDW: 14.6 % (ref 11.5–15.5)
WBC: 18.5 10*3/uL — ABNORMAL HIGH (ref 4.0–10.5)

## 2015-07-23 LAB — GLUCOSE, CAPILLARY
GLUCOSE-CAPILLARY: 140 mg/dL — AB (ref 65–99)
Glucose-Capillary: 126 mg/dL — ABNORMAL HIGH (ref 65–99)
Glucose-Capillary: 109 mg/dL — ABNORMAL HIGH (ref 65–99)
Glucose-Capillary: 120 mg/dL — ABNORMAL HIGH (ref 65–99)

## 2015-07-23 LAB — MAGNESIUM: Magnesium: 2.3 mg/dL (ref 1.7–2.4)

## 2015-07-23 LAB — MRSA PCR SCREENING: MRSA by PCR: NEGATIVE

## 2015-07-23 LAB — CBG MONITORING, ED: GLUCOSE-CAPILLARY: 121 mg/dL — AB (ref 65–99)

## 2015-07-23 MED ORDER — TAMSULOSIN HCL 0.4 MG PO CAPS
0.4000 mg | ORAL_CAPSULE | Freq: Every day | ORAL | Status: DC
Start: 2015-07-23 — End: 2015-07-27
  Administered 2015-07-23 – 2015-07-27 (×5): 0.4 mg via ORAL
  Filled 2015-07-23 (×5): qty 1

## 2015-07-23 MED ORDER — ALBUTEROL SULFATE (2.5 MG/3ML) 0.083% IN NEBU
2.5000 mg | INHALATION_SOLUTION | RESPIRATORY_TRACT | Status: DC | PRN
Start: 2015-07-23 — End: 2015-07-27

## 2015-07-23 MED ORDER — HYDROCODONE-ACETAMINOPHEN 5-325 MG PO TABS: 1.0000 | ORAL_TABLET | Freq: Four times a day (QID) | ORAL | Status: DC | PRN

## 2015-07-23 NOTE — ED Notes (Signed)
CBG is 121 

## 2015-07-23 NOTE — Progress Notes (Signed)
[facility] PROGRESS NOTE  [name] [id number] DOB: 06/25/1965 DOA: 07/22/2015 PCP: Candi Leash  Assessment/Plan: Principal Problem:   Seizure ([place]) - EEG suspicious for seizure like activity - consulted Neurology - Obtain MRI of brain - continue xanax - Ativan if patient unable to take xanax orally - further recs to come from neuro after their eval.  Leukocytosis - Most likely due to oral periapical dental abscess - improving on clindamycin  Active Problems:   Asthma - compensated currently no wheezes   Hypothyroidism - continue synthroid    Bipolar 1 disorder ([place]) - continue home medication regimen  Code Status: full Family Communication: d/c patient and family at bedside.  Disposition Plan: Pending improvement in condition   Consultants:  None  Procedures:  None  Antibiotics:  clindamycin  HPI/Subjective: Pt has no new complaints. No acute issues overnight.  Objective: Filed Vitals:   07/23/15 0900 07/23/15 1000  BP: 140/73 136/75  Pulse: 92 89  Temp:    Resp: 15 15    Intake/Output Summary (Last 24 hours) at 07/23/15 1035 Last data filed at 07/23/15 1000  Gross per 24 hour  Intake    300 ml  Output      0 ml  Net    300 ml   Filed Weights   07/22/15 1542 07/22/15 2153 07/23/15 0715  Weight: 191.418 kg (422 lb) 178.6 kg (393 lb 11.9 oz) 172.6 kg (380 lb 8.2 oz)    Exam:   General:  Pt in nad, alert and awake  Cardiovascular: rrr, no mrg  Respiratory: cta bl, no wheezes  Abdomen: soft, nd, nt, obese  Musculoskeletal: no cyanosis or clubbing   Data Reviewed: Basic Metabolic Panel:  Recent Labs Lab 07/22/15 1553 07/22/15 1608 07/23/15 0314  NA 134* 136 138  K 3.9 3.8 3.9  CL 100* 100* 101  CO2 18*  --  21*  GLUCOSE 221* 227* 123*  BUN 14 17 12   CREATININE 0.82 0.70 0.80  CALCIUM 9.8  --  9.8   Liver Function Tests:  Recent Labs Lab 07/22/15 1553 07/23/15 0314  AST 49* 40  ALT 86* 78*  ALKPHOS  88 81  BILITOT 0.4 0.5  PROT 8.0 8.4*  ALBUMIN 4.2 4.2   No results for input(s): LIPASE, AMYLASE in the last 168 hours. No results for input(s): AMMONIA in the last 168 hours. CBC:  Recent Labs Lab 07/22/15 1553 07/22/15 1608 07/23/15 0314  WBC 22.2*  --  18.5*  NEUTROABS 19.5*  --   --   HGB 14.9 18.0* 14.7  HCT 44.9 53.0* 44.0  MCV 79.6  --  80.0  PLT 496*  --  456*   Cardiac Enzymes:  Recent Labs Lab 07/22/15 1553  TROPONINI <0.03   BNP (last 3 results) No results for input(s): BNP in the last 8760 hours.  ProBNP (last 3 results) No results for input(s): PROBNP in the last 8760 hours.  CBG:  Recent Labs Lab 07/23/15 0152  GLUCAP 121*    Recent Results (from the past 240 hour(s))  MRSA PCR Screening     Status: None   Collection Time: 07/23/15  7:15 AM  Result Value Ref Range Status   MRSA by PCR NEGATIVE NEGATIVE Final    Comment:        The GeneXpert MRSA Assay (FDA approved for NASAL specimens only), is one component of a comprehensive MRSA colonization surveillance program. It is not intended to diagnose MRSA infection nor to guide or monitor  treatment for MRSA infections.      Studies: Ct Head Wo Contrast  07/22/2015  CLINICAL DATA:  Altered mental status. Seizure. Head and face bleeding. Unknown injury. EXAM: CT HEAD WITHOUT CONTRAST TECHNIQUE: Contiguous axial images were obtained from the base of the skull through the vertex without intravenous contrast. COMPARISON:  03/06/2011. FINDINGS: Diffusely enlarged ventricles and subarachnoid spaces. No skull fracture, intracranial hemorrhage, mass lesion, CT evidence of acute infarction or paranasal sinus air-fluid levels. Left ethmoid and bilateral maxillary sinus retention cysts. Multiple missing anterior teeth. There is also periapical lucency involving a remaining right upper anterior tooth. IMPRESSION: 1. No skull fracture or intracranial hemorrhage. 2. Stable mild diffuse cerebral atrophy. 3.  Multiple missing upper anterior teeth. A remaining anterior right upper tooth has periapical lucency, suggesting a periapical abscess or dentigerous cyst. Electronically Signed   By: Claudie Revering M.D.   On: 07/22/2015 15:42   Dg Chest Portable 1 View  07/22/2015  CLINICAL DATA:  Initial evaluation for seizure, elevated white blood cell count, evaluate for possible aspiration. EXAM: PORTABLE CHEST 1 VIEW COMPARISON:  Prior radiograph from 05/22/2014. FINDINGS: Cardiac and mediastinal silhouettes are stable in size and contour, and remain within normal limits. Lungs are normally inflated. No focal infiltrates to suggest infectious pneumonitis or aspiration. No pulmonary edema. No definite pleural effusion, although costophrenic angles are incompletely visualized. No pneumothorax. No acute osseus abnormality para IMPRESSION: No active cardiopulmonary disease. No radiographic evidence for pneumonia or aspiration pneumonitis. Electronically Signed   By: Jeannine Boga M.D.   On: 07/22/2015 20:54    Scheduled Meds: . ALPRAZolam  1 mg Oral Q6H  . citalopram  20 mg Oral BID  . clindamycin (CLEOCIN) IV  900 mg Intravenous 3 times per day  . enoxaparin (LOVENOX) injection  80 mg Subcutaneous Q24H  . fenofibrate  160 mg Oral Daily  . insulin aspart  0-9 Units Subcutaneous TID WC  . lactulose  10 g Oral TID  . levothyroxine  100 mcg Oral QAC breakfast  . lisinopril  10 mg Oral Daily  . Melatonin  6 mg Oral QHS  . pantoprazole (PROTONIX) IV  40 mg Intravenous Q24H  . sodium chloride  1,000 mL Intravenous Once  . spironolactone  50 mg Oral Daily  . venlafaxine XR  75 mg Oral BID   Continuous Infusions:    Time spent: > 35 min    Velvet Bathe  [facility] Pager [phone]. If 7PM-7AM, please contact night-coverage at [email or web address], password [facility] 07/23/2015, 10:35 AM  LOS: 1 day

## 2015-07-23 NOTE — Progress Notes (Signed)
EEG completed, results pending. 

## 2015-07-23 NOTE — Procedures (Signed)
TECHNICAL SUMMARY:  A multichannel referential and bipolar montage EEG using the standard international 10-20 system was performed on the patient described as in ICU and awake.  The dominant background activity consists of a low voltage beta activity that is excessive.  10-11 Hz activity can be seen intermittently in the posterior head regions.  This is nonreactive to eye opening and closing procedures.    ACTIVATION:  Stepwise photic stimulation and hyperventilation were not performed.  EPILEPTIFORM ACTIVITY:  There were sharp and slow wave complexes noted over the right C4 electrode  SLEEP:  None  CARDIAC:  The EKG lead revealed a regular sinus rhythm.  IMPRESSION:  This is an abnormal EEG demonstrating sharp and slow wave complexes over the right C4 electrode.  There were no electrographic seizures noted.  There was also an excessive amount of fast (beta) activity which is likely due to medication effect (often benzodiazepines).  Correlate clinically

## 2015-07-23 NOTE — Consult Note (Signed)
NEURO HOSPITALIST CONSULT NOTE   Requestig physician: Dr. Dreama Saa   Reason for Consult: Seizure in setting of Xanax withdraw  HPI:                                                                                                                                          [name] is an 50 y.o. male with history of DM, HTN, CHF, HLD, obstructive uropathy, depression dependent on Xanax 1 mg QID who came to ED with complaint of seizure that he had last night while laying down in his hospital bed at home. He stopped Xanax yesterday afternoon abruptly. He did loose control over his bowels and urine and bit his tongue. He was post ictal after the seizure. He was loaded with 1500 mg Keppra while in ED but has not received any further doses. He was admitted and EEG was obtained. EEG at present time shows. No xanax has been started at this time.   Past Medical History  Diagnosis Date  . Hypertension   . CHF (congestive heart failure) (HCC)     Diastolic  . Arthritis   . Hyperlipemia   . Sleep apnea     He wore CPAP once  . Asthma   . Schizophrenia ([place])   . Hypothyroidism   . Bipolar 1 disorder ([place])   . Ejection fraction     EF 50-55%, March, 2012, technically difficult study, some RV dilatation  . Venous insufficiency   . Lithium toxicity     April, 2012  . Edema     Secondary to diastolic CHF  . Obesity   . Statin intolerance     Muscle aches from statins in the past  . Manic depression ([place])   . Diabetes ([place])   . Peripheral neuropathy [facility])     Past Surgical History  Procedure Laterality Date  . Tonsillectomy      Family History  Problem Relation Age of Onset  . Hypertension Mother   . Hypertension Father      Social History:  reports that he quit smoking about 2 months ago. His smoking use included Cigarettes. He started smoking about 31 years ago. He has a 60 pack-year smoking history. He has never used smokeless tobacco. He reports that he does  not drink alcohol or use illicit drugs.  Allergies  Allergen Reactions  . Aspirin Other (See Comments)    Causes asthmatic symptoms  . Statins     Joint pain, muscle weakness, atropy   . Penicillins Rash    MEDICATIONS:  Prior to Admission:  Prescriptions prior to admission  Medication Sig Dispense Refill Last Dose  . acetaminophen (TYLENOL) 500 MG tablet Take 1,000 mg by mouth every 6 (six) hours as needed (pain).    PRN  . albuterol (PROVENTIL HFA;VENTOLIN HFA) 108 (90 BASE) MCG/ACT inhaler Inhale 2 puffs into the lungs every 4 (four) hours as needed for wheezing.   PRN  . ALPRAZolam (XANAX) 1 MG tablet Take 1 tablet (1 mg total) by mouth 3 (three) times daily as needed for anxiety. (Patient taking differently: Take 1 mg by mouth 4 (four) times daily. ) 12 tablet 0 07/22/2015 at Unknown time  . Calcium Carbonate Antacid (ANTACID PO) Take 1 tablet by mouth daily as needed (heartburn).   PRN  . cholecalciferol (VITAMIN D) 1000 units tablet Take 1,000 Units by mouth daily.   07/22/2015 at Unknown time  . citalopram (CELEXA) 20 MG tablet Take 20 mg by mouth 2 (two) times daily.   07/22/2015 at Unknown time  . Cyanocobalamin (VITAMIN B-12 PO) Take 1 tablet by mouth daily.   07/22/2015 at Unknown time  . dicyclomine (BENTYL) 20 MG tablet Take 20 mg by mouth 4 times daily.    07/22/2015 at Unknown time  . fenofibrate 160 MG tablet Take 160 mg by mouth daily.   07/21/2015 at Unknown time  . furosemide (LASIX) 80 MG tablet Take 40-80 mg by mouth 2 (two) times daily.   07/22/2015 at Unknown time  . lactulose (CHRONULAC) 10 GM/15ML solution Take 10 g by mouth 3 (three) times daily.   07/22/2015 at Unknown time  . levothyroxine (SYNTHROID, LEVOTHROID) 100 MCG tablet Take 100 mcg by mouth every morning.    07/21/2015 at Unknown time  . lisinopril (PRINIVIL,ZESTRIL) 10 MG tablet Take 10 mg by  mouth daily.   07/22/2015 at Unknown time  . Melatonin 5 MG TABS Take 5 mg by mouth at bedtime.   07/21/2015 at Unknown time  . Omega-3 Fatty Acids (FISH OIL PO) Take 1 tablet by mouth daily.   07/22/2015 at Unknown time  . pantoprazole (PROTONIX) 40 MG tablet Take 40 mg by mouth every evening.   07/21/2015 at Unknown time  . polyethylene glycol (MIRALAX / GLYCOLAX) packet Take 17 g by mouth daily as needed for mild constipation.   PRN  . promethazine (PHENERGAN) 25 MG tablet Take 25 mg by mouth every 6 (six) hours as needed for nausea or vomiting.   07/21/2015 at Unknown time  . ranitidine (ZANTAC) 150 MG capsule Take 150-300 mg by mouth daily as needed for heartburn.   PRN  . saxagliptin HCl (ONGLYZA) 5 MG TABS tablet Take 1 tablet (5 mg total) by mouth daily. 30 tablet 1 07/22/2015 at Unknown time  . spironolactone (ALDACTONE) 50 MG tablet Take 50 mg by mouth daily.   07/21/2015 at Unknown time  . venlafaxine XR (EFFEXOR-XR) 75 MG 24 hr capsule Take 1 capsule (75 mg total) by mouth 2 (two) times daily. (Patient taking differently: Take 75 mg by mouth 3 (three) times daily. )   07/22/2015 at Unknown time   Scheduled: . ALPRAZolam  1 mg Oral Q6H  . citalopram  20 mg Oral BID  . clindamycin (CLEOCIN) IV  900 mg Intravenous 3 times per day  . enoxaparin (LOVENOX) injection  80 mg Subcutaneous Q24H  . fenofibrate  160 mg Oral Daily  . insulin aspart  0-9 Units Subcutaneous TID WC  . lactulose  10 g Oral TID  . levothyroxine  100  mcg Oral QAC breakfast  . lisinopril  10 mg Oral Daily  . Melatonin  6 mg Oral QHS  . pantoprazole (PROTONIX) IV  40 mg Intravenous Q24H  . sodium chloride  1,000 mL Intravenous Once  . spironolactone  50 mg Oral Daily  . venlafaxine XR  75 mg Oral BID     ROS:                                                                                                                                       History obtained from the patient  General ROS: negative for - chills,  fatigue, fever, night sweats, weight gain or weight loss Psychological ROS: negative for - behavioral disorder, hallucinations, memory difficulties, mood swings or suicidal ideation Ophthalmic ROS: negative for - blurry vision, double vision, eye pain or loss of vision ENT ROS: negative for - epistaxis, nasal discharge, oral lesions, sore throat, tinnitus or vertigo Allergy and Immunology ROS: negative for - hives or itchy/watery eyes Hematological and Lymphatic ROS: negative for - bleeding problems, bruising or swollen lymph nodes Endocrine ROS: negative for - galactorrhea, hair pattern changes, polydipsia/polyuria or temperature intolerance Respiratory ROS: negative for - cough, hemoptysis, shortness of breath or wheezing Cardiovascular ROS: negative for - chest pain, dyspnea on exertion, edema or irregular heartbeat Gastrointestinal ROS: negative for - abdominal pain, diarrhea, hematemesis, nausea/vomiting or stool incontinence Genito-Urinary ROS: negative for - dysuria, hematuria, incontinence or urinary frequency/urgency Musculoskeletal ROS: negative for - joint swelling or muscular weakness Neurological ROS: as noted in HPI Dermatological ROS: negative for rash and skin lesion changes   Blood pressure 136/75, pulse 89, temperature 98.1 F (36.7 C), temperature source Oral, resp. rate 15, height 6\' 1"  (1.854 m), weight 172.6 kg (380 lb 8.2 oz), SpO2 97 %.   Neurologic Examination:                                                                                                      HEENT-  Normocephalic, no lesions, without obvious abnormality.  Normal external eye and conjunctiva.  Normal TM's bilaterally.  Normal auditory canals and external ears. Normal external nose, mucus membranes and septum.  Normal pharynx. Cardiovascular- S1, S2 normal, pulses palpable throughout   Lungs- chest clear, no wheezing, rales, normal symmetric air entry Abdomen- normal findings: bowel sounds  normal Extremities- atrophic findings in LE Lymph-no adenopathy palpable Musculoskeletal-no joint tenderness, deformity or swelling Skin-atrophic venous statis in feet and ankles.   Neurological Examination Mental  Status: Alert, oriented, thought content appropriate.  Speech fluent without evidence of aphasia.  Able to follow 3 step commands without difficulty. Cranial Nerves: II:  Visual fields grossly normal, pupils equal, round, reactive to light and accommodation III,IV, VI: ptosis not present, extra-ocular motions intact bilaterally--he does have esotropia noted when looking straight forward.  V,VII: smile symmetric but at rest showed a left facial droop secondary to not having his partial in on the at side.  facial light touch sensation normal bilaterally VIII: hearing normal bilaterally IX,X: uvula rises symmetrically XI: bilateral shoulder shrug XII: midline tongue extension Motor: 4+ Bilateral UE and Knee flexion and extension. 4/5 on the ankle flexion and extension.  Sensory: Pinprick and light touch intact throughout, bilaterally Deep Tendon Reflexes: hypoactive in the UE bilaterally and absent in LE bilaterally Plantars: Right: downgoing   Left: downgoing Cerebellar: normal finger-to-nose,  Gait: deffered      Lab Results: Basic Metabolic Panel:  Recent Labs Lab 07/22/15 1553 07/22/15 1608 07/23/15 0314  NA 134* 136 138  K 3.9 3.8 3.9  CL 100* 100* 101  CO2 18*  --  21*  GLUCOSE 221* 227* 123*  BUN 14 17 12   CREATININE 0.82 0.70 0.80  CALCIUM 9.8  --  9.8    Liver Function Tests:  Recent Labs Lab 07/22/15 1553 07/23/15 0314  AST 49* 40  ALT 86* 78*  ALKPHOS 88 81  BILITOT 0.4 0.5  PROT 8.0 8.4*  ALBUMIN 4.2 4.2   No results for input(s): LIPASE, AMYLASE in the last 168 hours. No results for input(s): AMMONIA in the last 168 hours.  CBC:  Recent Labs Lab 07/22/15 1553 07/22/15 1608 07/23/15 0314  WBC 22.2*  --  18.5*  NEUTROABS 19.5*   --   --   HGB 14.9 18.0* 14.7  HCT 44.9 53.0* 44.0  MCV 79.6  --  80.0  PLT 496*  --  456*    Cardiac Enzymes:  Recent Labs Lab 07/22/15 1553  TROPONINI <0.03    Lipid Panel: No results for input(s): CHOL, TRIG, HDL, CHOLHDL, VLDL, LDLCALC in the last 168 hours.  CBG:  Recent Labs Lab 07/23/15 0152  GLUCAP 121*    Microbiology: Results for orders placed or performed during the hospital encounter of 07/22/15  MRSA PCR Screening     Status: None   Collection Time: 07/23/15  7:15 AM  Result Value Ref Range Status   MRSA by PCR NEGATIVE NEGATIVE Final    Comment:        The GeneXpert MRSA Assay (FDA approved for NASAL specimens only), is one component of a comprehensive MRSA colonization surveillance program. It is not intended to diagnose MRSA infection nor to guide or monitor treatment for MRSA infections.     Coagulation Studies: No results for input(s): LABPROT, INR in the last 72 hours.  Imaging: Ct Head Wo Contrast  07/22/2015  CLINICAL DATA:  Altered mental status. Seizure. Head and face bleeding. Unknown injury. EXAM: CT HEAD WITHOUT CONTRAST TECHNIQUE: Contiguous axial images were obtained from the base of the skull through the vertex without intravenous contrast. COMPARISON:  03/06/2011. FINDINGS: Diffusely enlarged ventricles and subarachnoid spaces. No skull fracture, intracranial hemorrhage, mass lesion, CT evidence of acute infarction or paranasal sinus air-fluid levels. Left ethmoid and bilateral maxillary sinus retention cysts. Multiple missing anterior teeth. There is also periapical lucency involving a remaining right upper anterior tooth. IMPRESSION: 1. No skull fracture or intracranial hemorrhage. 2. Stable mild diffuse cerebral atrophy. 3. Multiple  missing upper anterior teeth. A remaining anterior right upper tooth has periapical lucency, suggesting a periapical abscess or dentigerous cyst. Electronically Signed   By: Claudie Revering M.D.   On:  07/22/2015 15:42   Dg Chest Portable 1 View  07/22/2015  CLINICAL DATA:  Initial evaluation for seizure, elevated white blood cell count, evaluate for possible aspiration. EXAM: PORTABLE CHEST 1 VIEW COMPARISON:  Prior radiograph from 05/22/2014. FINDINGS: Cardiac and mediastinal silhouettes are stable in size and contour, and remain within normal limits. Lungs are normally inflated. No focal infiltrates to suggest infectious pneumonitis or aspiration. No pulmonary edema. No definite pleural effusion, although costophrenic angles are incompletely visualized. No pneumothorax. No acute osseus abnormality para IMPRESSION: No active cardiopulmonary disease. No radiographic evidence for pneumonia or aspiration pneumonitis. Electronically Signed   By: Jeannine Boga M.D.   On: 07/22/2015 20:54   Assessment and plan per attending neurologist  Etta Quill PA-C [facility] [phone]  07/23/2015, 10:50 AM   Assessment/Plan:  31. 50 year old male with new onset seizures in the context of benzodiazepine withdrawal. Video EEG reveals occasional episodes of speech arrest with head nodding movements and focal EEG correlate. Preserved memory for episodes, described by patient as sensation of fear during the spells. Episodes most consistent with simple partial seizures secondary to Xanax withdrawal.  2. Recommend 1 mg PRN Ativan x 1 now. Continue PRN Ativan order and restart scheduled Xanax.  3. Keppra not indicated unless seizures do not resolve after restarting Xanax.  4. Obtain magnesium level.  5. Official EEG report pending.   Kerney Elbe, MD

## 2015-07-23 NOTE — Progress Notes (Signed)
Pt transferred to 2C06 with Caprice Red, NT. RN updated at bedside. Wife at bedside. Patient A/Ox4, does not complain of any pain.

## 2015-07-24 LAB — GLUCOSE, CAPILLARY
GLUCOSE-CAPILLARY: 126 mg/dL — AB (ref 65–99)
Glucose-Capillary: 109 mg/dL — ABNORMAL HIGH (ref 65–99)
Glucose-Capillary: 111 mg/dL — ABNORMAL HIGH (ref 65–99)
Glucose-Capillary: 100 mg/dL — ABNORMAL HIGH (ref 65–99)

## 2015-07-24 MED ORDER — PANTOPRAZOLE SODIUM 40 MG PO TBEC
40.0000 mg | DELAYED_RELEASE_TABLET | Freq: Every day | ORAL | Status: DC
  Administered 2015-07-24 – 2015-07-26 (×3): 40 mg via ORAL
  Filled 2015-07-24 (×3): qty 1

## 2015-07-24 NOTE — Progress Notes (Signed)
Utilization Review Completed.  

## 2015-07-24 NOTE — Progress Notes (Addendum)
Subjective: Worsened today per family, with repeated/perseverative questioning of family members and confusion. "Nodding" episodes with staring spells have resolved.   Objective: Current vital signs: BP 139/77 mmHg  Pulse 109  Temp(Src) 98.7 F (37.1 C) (Oral)  Resp 17  Ht 6\' 1"  (1.854 m)  Wt 173.1 kg (381 lb 9.9 oz)  BMI 50.36 kg/m2  SpO2 95% Vital signs in last 24 hours: Temp:  [98.5 F (36.9 C)-98.9 F (37.2 C)] 98.7 F (37.1 C) (02/21 1126) Pulse Rate:  [79-109] 109 (02/21 1510) Resp:  [12-19] 17 (02/21 1510) BP: (110-153)/(59-87) 139/77 mmHg (02/21 1510) SpO2:  [87 %-97 %] 95 % (02/21 1510) Weight:  [173.1 kg (381 lb 9.9 oz)] 173.1 kg (381 lb 9.9 oz) (02/20 2053)  Intake/Output from previous day: 02/20 0701 - 02/21 0700 In: 670 [P.O.:120; I.V.:300; IV Piggyback:250] Out: 1700 [Urine:1700] Intake/Output this shift: Total I/O In: 120 [P.O.:120] Out: 600 [Urine:600] Nutritional status: Diet Carb Modified Fluid consistency:: Thin; Room service appropriate?: Yes  Neurologic Exam: Ment: Awake with decreased alertness. Stares occasionally without apparent fixation. No jerking or twitching noted. Comprehension for multi-step commands and complex questions is impaired. Able to name objects. Asks the same questions of family perseveratively during the assessment. CN: Asymmetric left lower quadrant due to missing teeth still noted. No change to CN exam relative to yesterday.  Motor: 4+/5 bilateral upper extremities. 4/5 bilateral lower extremities, compromised by morbid obesity.  Sensory: Intact to tactile stimulation x 4.  Cerebellar: No ataxia on FNF.   Lab Results: Basic Metabolic Panel:  Recent Labs Lab 07/22/15 1553 07/22/15 1608 07/23/15 0314 07/23/15 1222  NA 134* 136 138  --   K 3.9 3.8 3.9  --   CL 100* 100* 101  --   CO2 18*  --  21*  --   GLUCOSE 221* 227* 123*  --   BUN 14 17 12   --   CREATININE 0.82 0.70 0.80  --   CALCIUM 9.8  --  9.8  --   MG  --    --   --  2.3    Liver Function Tests:  Recent Labs Lab 07/22/15 1553 07/23/15 0314  AST 49* 40  ALT 86* 78*  ALKPHOS 88 81  BILITOT 0.4 0.5  PROT 8.0 8.4*  ALBUMIN 4.2 4.2   No results for input(s): LIPASE, AMYLASE in the last 168 hours. No results for input(s): AMMONIA in the last 168 hours.  CBC:  Recent Labs Lab 07/22/15 1553 07/22/15 1608 07/23/15 0314  WBC 22.2*  --  18.5*  NEUTROABS 19.5*  --   --   HGB 14.9 18.0* 14.7  HCT 44.9 53.0* 44.0  MCV 79.6  --  80.0  PLT 496*  --  456*    Cardiac Enzymes:  Recent Labs Lab 07/22/15 1553  TROPONINI <0.03    Lipid Panel: No results for input(s): CHOL, TRIG, HDL, CHOLHDL, VLDL, LDLCALC in the last 168 hours.  CBG:  Recent Labs Lab 07/23/15 1140 07/23/15 1720 07/23/15 2059 07/24/15 0753 07/24/15 1245  GLUCAP 126* 109* 120* 126* 109*    Microbiology: Results for orders placed or performed during the hospital encounter of 07/22/15  MRSA PCR Screening     Status: None   Collection Time: 07/23/15  7:15 AM  Result Value Ref Range Status   MRSA by PCR NEGATIVE NEGATIVE Final    Comment:        The GeneXpert MRSA Assay (FDA approved for NASAL specimens only), is one component  of a comprehensive MRSA colonization surveillance program. It is not intended to diagnose MRSA infection nor to guide or monitor treatment for MRSA infections.     Coagulation Studies: No results for input(s): LABPROT, INR in the last 72 hours.  Imaging: US Renal  07/23/2015  CLINICAL DATA:  Urinary retention. EXAM: RENAL / URINARY TRACT ULTRASOUND COMPLETE COMPARISON:  05/22/2014 FINDINGS: Right Kidney: Length: 11.5 cm. Normal renal cortical thickness and echogenicity without focal lesions or hydronephrosis. Left Kidney: Length: 13.3 cm. Normal renal cortical thickness and echogenicity without focal lesions or hydronephrosis. Bladder: Appears normal for degree of bladder distention. IMPRESSION: Unremarkable renal ultrasound  examination.  No hydronephrosis. Electronically Signed   By: Marijo Sanes M.D.   On: 07/23/2015 20:31   Dg Chest Portable 1 View  07/22/2015  CLINICAL DATA:  Initial evaluation for seizure, elevated white blood cell count, evaluate for possible aspiration. EXAM: PORTABLE CHEST 1 VIEW COMPARISON:  Prior radiograph from 05/22/2014. FINDINGS: Cardiac and mediastinal silhouettes are stable in size and contour, and remain within normal limits. Lungs are normally inflated. No focal infiltrates to suggest infectious pneumonitis or aspiration. No pulmonary edema. No definite pleural effusion, although costophrenic angles are incompletely visualized. No pneumothorax. No acute osseus abnormality para IMPRESSION: No active cardiopulmonary disease. No radiographic evidence for pneumonia or aspiration pneumonitis. Electronically Signed   By: Jeannine Boga M.D.   On: 07/22/2015 20:54    Medications: Medication list reviewed.   Assessment: 1. Worsened clinical exam today, with confusion, poor attention, increased latency of verbal responses, decreased short term memory and perseverative questioning of examiner and family members. DDx includes hospital delirium, subclinical seizures, thiamine deficiency and benzodiazepine withdrawal with insufficient dose of current benzodiazepine given large volume of distribution (morbidly obese).  2. Some autonomic instability today, also suggestive of benzodiazepine withdrawal.   Recommendations: 1. EEG in AM. 2. MRI brain with and without contrast.  3. Switch po Xanax to scheduled Ativan. There is 1:2 equivalency for this pair of benzodiazepines, therefore 4 mg total of Xanax per day would be equivalent to 8 mg of Ativan per day. Given possible insufficient dose of Xanax, however, would double the total daily dose of Ativan to 16 mg. If increasing frequency of dosing to q3 hours, to result in less fluctuation of benzodiazepine level, a scheduled dosing regimen for Ativan  of 2 mg IV q3hours is recommended. When starting scheduled Ativan, can change Xanax to PRN.  4. Thiamine level.  5. After drawing thiamine level, start high dose thiamine as follows: Thiamine 500 mg IV TID x 3 days, then 250 mg IV TID x 3 days, then 100 mg IV or po qd thereafter.    Kerney Elbe, MD 07/24/2015, 4:46 PM

## 2015-07-24 NOTE — Progress Notes (Signed)
[facility] PROGRESS NOTE  [name] [id number] DOB: 02/21/1966 DOA: 07/22/2015 PCP: Candi Leash  Brief Narrative: 50 y/o with history of Bipolar d/o, DM, HTN, CHF, HLD, obstructive uropathy, depression dependent on xanax 1 mg qid who came in with seizure after discontinuing xanax. EEG obtained which had an abnormal finding. Neurology consulted.  Assessment/Plan: Principal Problem:   Seizure ([place]) - EEG suspicious for seizure like activity - consulted Neurology - continue xanax - further recs to come from neuro after their eval.  Leukocytosis - Most likely due to oral periapical dental abscess - improving on clindamycin - reassess wbc next am.  Active Problems:   Asthma - compensated currently no wheezes   Hypothyroidism - continue synthroid    Bipolar 1 disorder ([place]) - continue home medication regimen  Code Status: full Family Communication: d/c patient and family at bedside.  Disposition Plan: Pending further work up and recommendations from neurology   Consultants:  Neurology  Procedures:  None  Antibiotics:  clindamycin  HPI/Subjective: Pt has no new complaints. No acute issues overnight.  Objective: Filed Vitals:   07/24/15 1023 07/24/15 1126  BP: 139/84 153/80  Pulse:  92  Temp:    Resp:  19    Intake/Output Summary (Last 24 hours) at 07/24/15 1227 Last data filed at 07/24/15 1043  Gross per 24 hour  Intake    490 ml  Output   2300 ml  Net  -1810 ml   Filed Weights   07/22/15 2153 07/23/15 0715 07/23/15 2053  Weight: 178.6 kg (393 lb 11.9 oz) 172.6 kg (380 lb 8.2 oz) 173.1 kg (381 lb 9.9 oz)    Exam:   General:  Pt in nad, alert and awake  Cardiovascular: rrr, no mrg  Respiratory: cta bl, no wheezes  Abdomen: soft, nd, nt, obese  Musculoskeletal: no cyanosis or clubbing   Data Reviewed: Basic Metabolic Panel:  Recent Labs Lab 07/22/15 1553 07/22/15 1608 07/23/15 0314 07/23/15 1222  NA 134* 136 138  --    K 3.9 3.8 3.9  --   CL 100* 100* 101  --   CO2 18*  --  21*  --   GLUCOSE 221* 227* 123*  --   BUN 14 17 12   --   CREATININE 0.82 0.70 0.80  --   CALCIUM 9.8  --  9.8  --   MG  --   --   --  2.3   Liver Function Tests:  Recent Labs Lab 07/22/15 1553 07/23/15 0314  AST 49* 40  ALT 86* 78*  ALKPHOS 88 81  BILITOT 0.4 0.5  PROT 8.0 8.4*  ALBUMIN 4.2 4.2   No results for input(s): LIPASE, AMYLASE in the last 168 hours. No results for input(s): AMMONIA in the last 168 hours. CBC:  Recent Labs Lab 07/22/15 1553 07/22/15 1608 07/23/15 0314  WBC 22.2*  --  18.5*  NEUTROABS 19.5*  --   --   HGB 14.9 18.0* 14.7  HCT 44.9 53.0* 44.0  MCV 79.6  --  80.0  PLT 496*  --  456*   Cardiac Enzymes:  Recent Labs Lab 07/22/15 1553  TROPONINI <0.03   BNP (last 3 results) No results for input(s): BNP in the last 8760 hours.  ProBNP (last 3 results) No results for input(s): PROBNP in the last 8760 hours.  CBG:  Recent Labs Lab 07/23/15 0749 07/23/15 1140 07/23/15 1720 07/23/15 2059 07/24/15 0753  GLUCAP 140* 126* 109* 120* 126*    Recent  Results (from the past 240 hour(s))  MRSA PCR Screening     Status: None   Collection Time: 07/23/15  7:15 AM  Result Value Ref Range Status   MRSA by PCR NEGATIVE NEGATIVE Final    Comment:        The GeneXpert MRSA Assay (FDA approved for NASAL specimens only), is one component of a comprehensive MRSA colonization surveillance program. It is not intended to diagnose MRSA infection nor to guide or monitor treatment for MRSA infections.      Studies: Ct Head Wo Contrast  07/22/2015  CLINICAL DATA:  Altered mental status. Seizure. Head and face bleeding. Unknown injury. EXAM: CT HEAD WITHOUT CONTRAST TECHNIQUE: Contiguous axial images were obtained from the base of the skull through the vertex without intravenous contrast. COMPARISON:  03/06/2011. FINDINGS: Diffusely enlarged ventricles and subarachnoid spaces. No skull  fracture, intracranial hemorrhage, mass lesion, CT evidence of acute infarction or paranasal sinus air-fluid levels. Left ethmoid and bilateral maxillary sinus retention cysts. Multiple missing anterior teeth. There is also periapical lucency involving a remaining right upper anterior tooth. IMPRESSION: 1. No skull fracture or intracranial hemorrhage. 2. Stable mild diffuse cerebral atrophy. 3. Multiple missing upper anterior teeth. A remaining anterior right upper tooth has periapical lucency, suggesting a periapical abscess or dentigerous cyst. Electronically Signed   By: Claudie Revering M.D.   On: 07/22/2015 15:42   US Renal  07/23/2015  CLINICAL DATA:  Urinary retention. EXAM: RENAL / URINARY TRACT ULTRASOUND COMPLETE COMPARISON:  05/22/2014 FINDINGS: Right Kidney: Length: 11.5 cm. Normal renal cortical thickness and echogenicity without focal lesions or hydronephrosis. Left Kidney: Length: 13.3 cm. Normal renal cortical thickness and echogenicity without focal lesions or hydronephrosis. Bladder: Appears normal for degree of bladder distention. IMPRESSION: Unremarkable renal ultrasound examination.  No hydronephrosis. Electronically Signed   By: Marijo Sanes M.D.   On: 07/23/2015 20:31   Dg Chest Portable 1 View  07/22/2015  CLINICAL DATA:  Initial evaluation for seizure, elevated white blood cell count, evaluate for possible aspiration. EXAM: PORTABLE CHEST 1 VIEW COMPARISON:  Prior radiograph from 05/22/2014. FINDINGS: Cardiac and mediastinal silhouettes are stable in size and contour, and remain within normal limits. Lungs are normally inflated. No focal infiltrates to suggest infectious pneumonitis or aspiration. No pulmonary edema. No definite pleural effusion, although costophrenic angles are incompletely visualized. No pneumothorax. No acute osseus abnormality para IMPRESSION: No active cardiopulmonary disease. No radiographic evidence for pneumonia or aspiration pneumonitis. Electronically Signed    By: Jeannine Boga M.D.   On: 07/22/2015 20:54    Scheduled Meds: . ALPRAZolam  1 mg Oral Q6H  . citalopram  20 mg Oral BID  . clindamycin (CLEOCIN) IV  900 mg Intravenous 3 times per day  . enoxaparin (LOVENOX) injection  80 mg Subcutaneous Q24H  . fenofibrate  160 mg Oral Daily  . insulin aspart  0-9 Units Subcutaneous TID WC  . lactulose  10 g Oral TID  . levothyroxine  100 mcg Oral QAC breakfast  . lisinopril  10 mg Oral Daily  . Melatonin  6 mg Oral QHS  . pantoprazole  40 mg Oral Q1200  . sodium chloride  1,000 mL Intravenous Once  . spironolactone  50 mg Oral Daily  . tamsulosin  0.4 mg Oral Daily  . venlafaxine XR  75 mg Oral BID   Continuous Infusions:  Time spent: > 35 min  Velvet Bathe  [facility] Pager [phone]. If 7PM-7AM, please contact night-coverage at [email or web address], password [facility] 07/24/2015,  12:27 PM  LOS: 2 days

## 2015-07-25 ENCOUNTER — Inpatient Hospital Stay (HOSPITAL_COMMUNITY): Payer: Medicare Other

## 2015-07-25 LAB — CBC
HCT: 45 % (ref 39.0–52.0)
Hemoglobin: 14.4 g/dL (ref 13.0–17.0)
MCH: 26.1 pg (ref 26.0–34.0)
MCHC: 32 g/dL (ref 30.0–36.0)
MCV: 81.5 fL (ref 78.0–100.0)
PLATELETS: 413 10*3/uL — AB (ref 150–400)
RBC: 5.52 MIL/uL (ref 4.22–5.81)
RDW: 15 % (ref 11.5–15.5)
WBC: 10.8 10*3/uL — AB (ref 4.0–10.5)

## 2015-07-25 LAB — GLUCOSE, CAPILLARY
GLUCOSE-CAPILLARY: 106 mg/dL — AB (ref 65–99)
Glucose-Capillary: 118 mg/dL — ABNORMAL HIGH (ref 65–99)
Glucose-Capillary: 102 mg/dL — ABNORMAL HIGH (ref 65–99)
Glucose-Capillary: 103 mg/dL — ABNORMAL HIGH (ref 65–99)

## 2015-07-25 MED ORDER — CLINDAMYCIN HCL 300 MG PO CAPS
300.0000 mg | ORAL_CAPSULE | Freq: Three times a day (TID) | ORAL | Status: DC
  Administered 2015-07-25 – 2015-07-27 (×6): 300 mg via ORAL
  Filled 2015-07-25 (×7): qty 1

## 2015-07-25 MED ORDER — BETHANECHOL CHLORIDE 10 MG PO TABS
10.0000 mg | ORAL_TABLET | Freq: Three times a day (TID) | ORAL | Status: DC
  Administered 2015-07-25 – 2015-07-27 (×6): 10 mg via ORAL
  Filled 2015-07-25 (×7): qty 1

## 2015-07-25 MED ORDER — LORAZEPAM 2 MG/ML IJ SOLN
1.0000 mg | INTRAMUSCULAR | Status: DC
  Administered 2015-07-25 – 2015-07-26 (×5): 1 mg via INTRAVENOUS
  Filled 2015-07-25 (×5): qty 1

## 2015-07-25 NOTE — Procedures (Signed)
ELECTROENCEPHALOGRAM REPORT  Date of Study: 07/25/2015  Patient's Name: [name] MRN: [id number] Date of Birth: Feb 25, 1966  Referring Provider: Etta Quill, PA-C  Clinical History: This is a 50 year old man with recent seizure after stopping Xanax abruptly. Previous EEG was abnormal.  Medications: albuterol (PROVENTIL) (2.5 MG/3ML) 0.083% nebulizer solution 2.5 mg benzocaine (HURRICAINE) 20 % mouth spray bethanechol (URECHOLINE) tablet 10 mg citalopram (CELEXA) tablet 20 mg clindamycin (CLEOCIN) capsule 300 mg enoxaparin (LOVENOX) injection 80 mg fenofibrate tablet 160 mg HYDROcodone-acetaminophen (NORCO/VICODIN) 5-325 MG per tablet 1-2 tablet insulin aspart (novoLOG) injection 0-9 Units lactulose (CHRONULAC) 10 GM/15ML solution 10 g levothyroxine (SYNTHROID, LEVOTHROID) tablet 100 mcg lisinopril (PRINIVIL,ZESTRIL) tablet 10 mg LORazepam (ATIVAN) injection 1 mg Melatonin TABS 6 mg ondansetron (ZOFRAN) injection 4 mg pantoprazole (PROTONIX) EC tablet 40 mg sodium chloride 0.9 % bolus 1,000 mL spironolactone (ALDACTONE) tablet 50 mg tamsulosin (FLOMAX) capsule 0.4 mg venlafaxine XR (EFFEXOR-XR) 24 hr capsule 75 mg  Technical Summary: A multichannel digital EEG recording measured by the international 10-20 system with electrodes applied with paste and impedances below 5000 ohms performed in our laboratory with EKG monitoring in an awake patient.  Hyperventilation and photic stimulation were not performed.  The digital EEG was referentially recorded, reformatted, and digitally filtered in a variety of bipolar and referential montages for optimal display.    Description: The patient is awake during the recording.  There is no clear posterior dominant rhythm. The record is symmetric. There is an excess amount of diffuse low voltage beta activity seen throughout the recording. Sleep architecture is not seen. Hyperventilation and photic stimulation were not performed.  There were  no epileptiform discharges or electrographic seizures seen.    EKG lead was unremarkable.  Impression: This awake EEG is normal.    Clinical Correlation: Sharp waves seen on previous study were not noted in this EEG. A normal EEG does not exclude a clinical diagnosis of epilepsy.  Clinical correlation is advised.   Ellouise Newer, M.D.

## 2015-07-25 NOTE — Progress Notes (Signed)
Bedside EEG completed, result pending.

## 2015-07-25 NOTE — Progress Notes (Addendum)
[facility] TEAM 1 - Stepdown/ICU TEAM PROGRESS NOTE  [name] [id number] DOB: Jul 22, 1965 DOA: 07/22/2015 PCP: Candi Leash, MD  Admit HPI / Brief Narrative: 50 y/o with history of Bipolar d/o, DM, HTN, CHF, HLD, obstructive uropathy, depression dependent on xanax 1 mg qid who came in with seizure after discontinuing xanax.   HPI/Subjective: The patient is alert and conversant.  He denies chest pain shortness breath fevers chills nausea or vomiting.  His wife and sister at the bedside and state he is essentially back to his baseline mental status.  Assessment/Plan:  Seizure  - EEG suspicious for seizure like activity - Neurology following and planning to repeat EEG today - awaiting further suggestions per neurology  Altered mental status -Differential includes benzodiazepine withdrawal, thiamine deficiency, or seizure/postictal state - thiamine level pending with replacement ongoing - mental status appears to return to baseline  Periapical dental abscess - improving on clindamycin - continue  Acute Urinary retention  - foley placed today for recurring retention - ?etiology - begin voiding trial in AM  Asthma - compensated   Hypothyroidism - continue synthroid  Bipolar 1 disorder  - continue home medication regimen  Morbid obesity - Body mass index is 50.36 kg/(m^2).   Code Status: FULL Family Communication: Spoke with wife and sister at bedside Disposition Plan: SDU  Consultants: Neurology   Procedures: EEG 2/21 Renal US - no hydro - no acute findings   Antibiotics: Clindamycin   DVT prophylaxis: lovenox  Objective: Blood pressure 141/85, pulse 103, temperature 98.4 F (36.9 C), temperature source Oral, resp. rate 12, height 6\' 1"  (1.854 m), weight 173.1 kg (381 lb 9.9 oz), SpO2 100 %.  Intake/Output Summary (Last 24 hours) at 07/25/15 1434 Last data filed at 07/25/15 1100  Gross per 24 hour  Intake    770 ml  Output   1100 ml  Net   -330 ml     Exam: General: No acute respiratory distress Lungs: Clear to auscultation bilaterally without wheezes or crackles Cardiovascular: Regular rate and rhythm without murmur gallop or rub normal S1 and S2 Abdomen: Nontender, morbidly obese, soft, bowel sounds positive, no rebound, no ascites, no appreciable mass Extremities: No significant cyanosis, clubbing, or edema bilateral lower extremities  Data Reviewed:  Basic Metabolic Panel:  Recent Labs Lab 07/22/15 1553 07/22/15 1608 07/23/15 0314 07/23/15 1222  NA 134* 136 138  --   K 3.9 3.8 3.9  --   CL 100* 100* 101  --   CO2 18*  --  21*  --   GLUCOSE 221* 227* 123*  --   BUN 14 17 12   --   CREATININE 0.82 0.70 0.80  --   CALCIUM 9.8  --  9.8  --   MG  --   --   --  2.3    CBC:  Recent Labs Lab 07/22/15 1553 07/22/15 1608 07/23/15 0314 07/25/15 0400  WBC 22.2*  --  18.5* 10.8*  NEUTROABS 19.5*  --   --   --   HGB 14.9 18.0* 14.7 14.4  HCT 44.9 53.0* 44.0 45.0  MCV 79.6  --  80.0 81.5  PLT 496*  --  456* 413*    Liver Function Tests:  Recent Labs Lab 07/22/15 1553 07/23/15 0314  AST 49* 40  ALT 86* 78*  ALKPHOS 88 81  BILITOT 0.4 0.5  PROT 8.0 8.4*  ALBUMIN 4.2 4.2   Cardiac Enzymes:  Recent Labs Lab 07/22/15 1553  TROPONINI <0.03  CBG:  Recent Labs Lab 07/24/15 1245 07/24/15 1616 07/24/15 2159 07/25/15 0815 07/25/15 1315  GLUCAP 109* 111* 100* 118* 102*    Recent Results (from the past 240 hour(s))  MRSA PCR Screening     Status: None   Collection Time: 07/23/15  7:15 AM  Result Value Ref Range Status   MRSA by PCR NEGATIVE NEGATIVE Final    Comment:        The GeneXpert MRSA Assay (FDA approved for NASAL specimens only), is one component of a comprehensive MRSA colonization surveillance program. It is not intended to diagnose MRSA infection nor to guide or monitor treatment for MRSA infections.      Studies:   Recent x-ray studies have been reviewed in detail by the  Attending Physician  Scheduled Meds:  Scheduled Meds: . ALPRAZolam  1 mg Oral Q6H  . citalopram  20 mg Oral BID  . clindamycin (CLEOCIN) IV  900 mg Intravenous 3 times per day  . enoxaparin (LOVENOX) injection  80 mg Subcutaneous Q24H  . fenofibrate  160 mg Oral Daily  . insulin aspart  0-9 Units Subcutaneous TID WC  . lactulose  10 g Oral TID  . levothyroxine  100 mcg Oral QAC breakfast  . lisinopril  10 mg Oral Daily  . Melatonin  6 mg Oral QHS  . pantoprazole  40 mg Oral Q1200  . sodium chloride  1,000 mL Intravenous Once  . spironolactone  50 mg Oral Daily  . tamsulosin  0.4 mg Oral Daily  . venlafaxine XR  75 mg Oral BID    Time spent on care of this patient: 35 mins   Melinda Pottinger T , MD   [facility] Office  [phone] Pager - Text Page per Shea Evans as per below:  On-Call/Text Page:      [email or web address]      password [email or web address]  If 7PM-7AM, please contact night-coverage [email or web address] Password [facility] 07/25/2015, 2:34 PM   LOS: 3 days

## 2015-07-25 NOTE — Care Management Important Message (Signed)
Important Message  Patient Details  Name: [name] MRN: [id number] Date of Birth: 1965-10-23   Medicare Important Message Given:  Yes    Nathen May 07/25/2015, 12:08 PM

## 2015-07-25 NOTE — Progress Notes (Signed)
Subjective: Feeling depressed but no other complaints. No seizures over night.   Objective: Current vital signs: BP 130/72 mmHg  Pulse 78  Temp(Src) 99.1 F (37.3 C) (Oral)  Resp 20  Ht 6\' 1"  (1.854 m)  Wt 173.1 kg (381 lb 9.9 oz)  BMI 50.36 kg/m2  SpO2 96% Vital signs in last 24 hours: Temp:  [98.7 F (37.1 C)-99.2 F (37.3 C)] 99.1 F (37.3 C) (02/22 0816) Pulse Rate:  [74-109] 78 (02/22 0816) Resp:  [13-20] 20 (02/22 0816) BP: (117-158)/(51-84) 130/72 mmHg (02/22 0816) SpO2:  [90 %-100 %] 96 % (02/22 0816)  Intake/Output from previous day: 02/21 0701 - 02/22 0700 In: 220 [P.O.:120; IV Piggyback:100] Out: 1700 [Urine:1700] Intake/Output this shift:   Nutritional status: Diet Carb Modified Fluid consistency:: Thin; Room service appropriate?: Yes  Neurologic Exam: Ment: Awake and fully alert. States he feels depressed. No jerking or twitching noted. Able to follow commands. Able to name objects. . CN: Asymmetric left lower quadrant due to missing teeth still noted. No change to CN exam relative to yesterday.  Motor: 4+/5 bilateral upper extremities. 4/5 bilateral lower extremities, compromised by morbid obesity.  Sensory: Intact to tactile stimulation x 4.  Cerebellar: No ataxia on FNF.    Lab Results: Basic Metabolic Panel:  Recent Labs Lab 07/22/15 1553 07/22/15 1608 07/23/15 0314 07/23/15 1222  NA 134* 136 138  --   K 3.9 3.8 3.9  --   CL 100* 100* 101  --   CO2 18*  --  21*  --   GLUCOSE 221* 227* 123*  --   BUN 14 17 12   --   CREATININE 0.82 0.70 0.80  --   CALCIUM 9.8  --  9.8  --   MG  --   --   --  2.3    Liver Function Tests:  Recent Labs Lab 07/22/15 1553 07/23/15 0314  AST 49* 40  ALT 86* 78*  ALKPHOS 88 81  BILITOT 0.4 0.5  PROT 8.0 8.4*  ALBUMIN 4.2 4.2   No results for input(s): LIPASE, AMYLASE in the last 168 hours. No results for input(s): AMMONIA in the last 168 hours.  CBC:  Recent Labs Lab 07/22/15 1553  07/22/15 1608 07/23/15 0314 07/25/15 0400  WBC 22.2*  --  18.5* 10.8*  NEUTROABS 19.5*  --   --   --   HGB 14.9 18.0* 14.7 14.4  HCT 44.9 53.0* 44.0 45.0  MCV 79.6  --  80.0 81.5  PLT 496*  --  456* 413*    Cardiac Enzymes:  Recent Labs Lab 07/22/15 1553  TROPONINI <0.03    Lipid Panel: No results for input(s): CHOL, TRIG, HDL, CHOLHDL, VLDL, LDLCALC in the last 168 hours.  CBG:  Recent Labs Lab 07/24/15 0753 07/24/15 1245 07/24/15 1616 07/24/15 2159 07/25/15 0815  GLUCAP 126* 109* 111* 100* 118*    Microbiology: Results for orders placed or performed during the hospital encounter of 07/22/15  MRSA PCR Screening     Status: None   Collection Time: 07/23/15  7:15 AM  Result Value Ref Range Status   MRSA by PCR NEGATIVE NEGATIVE Final    Comment:        The GeneXpert MRSA Assay (FDA approved for NASAL specimens only), is one component of a comprehensive MRSA colonization surveillance program. It is not intended to diagnose MRSA infection nor to guide or monitor treatment for MRSA infections.     Coagulation Studies: No results for input(s): LABPROT, INR  in the last 72 hours.  Imaging: US Renal  07/23/2015  CLINICAL DATA:  Urinary retention. EXAM: RENAL / URINARY TRACT ULTRASOUND COMPLETE COMPARISON:  05/22/2014 FINDINGS: Right Kidney: Length: 11.5 cm. Normal renal cortical thickness and echogenicity without focal lesions or hydronephrosis. Left Kidney: Length: 13.3 cm. Normal renal cortical thickness and echogenicity without focal lesions or hydronephrosis. Bladder: Appears normal for degree of bladder distention. IMPRESSION: Unremarkable renal ultrasound examination.  No hydronephrosis. Electronically Signed   By: Marijo Sanes M.D.   On: 07/23/2015 20:31    Medications:  Scheduled: . ALPRAZolam  1 mg Oral Q6H  . citalopram  20 mg Oral BID  . clindamycin (CLEOCIN) IV  900 mg Intravenous 3 times per day  . enoxaparin (LOVENOX) injection  80 mg  Subcutaneous Q24H  . fenofibrate  160 mg Oral Daily  . insulin aspart  0-9 Units Subcutaneous TID WC  . lactulose  10 g Oral TID  . levothyroxine  100 mcg Oral QAC breakfast  . lisinopril  10 mg Oral Daily  . Melatonin  6 mg Oral QHS  . pantoprazole  40 mg Oral Q1200  . sodium chloride  1,000 mL Intravenous Once  . spironolactone  50 mg Oral Daily  . tamsulosin  0.4 mg Oral Daily  . venlafaxine XR  75 mg Oral BID    Assessment/Plan: 1. Feeling depressed but back to his baseline today.  Episode of confusion yesterday includes a DDx includes hospital delirium, subclinical seizures, thiamine deficiency and benzodiazepine withdrawal with insufficient dose of current benzodiazepine given large volume of distribution (morbidly obese).  2. Some autonomic instability today, also suggestive of benzodiazepine withdrawal.   Recommendations: 1. EEG in AM. 2. MRI brain with and without contrast--if possible -may need to be done out patient with open MRI.  3. Switch po Xanax to scheduled Ativan. There is 1:2 equivalency for this pair of benzodiazepines, therefore 4 mg total of Xanax per day would be equivalent to 8 mg of Ativan per day. Given possible insufficient dose of Xanax, however, would double the total daily dose of Ativan to 16 mg. If increasing frequency of dosing to q3 hours, to result in less fluctuation of benzodiazepine level, a scheduled dosing regimen for Ativan of 2 mg IV q3hours is recommended. When starting scheduled Ativan, can change Xanax to PRN.  4. Thiamine level.  5. After drawing thiamine level, start high dose thiamine as follows: Thiamine 500 mg IV TID x 3 days, then 250 mg IV TID x 3 days, then 100 mg IV or po qd thereafter.     Etta Quill PA-C [facility] [phone]  07/25/2015, 10:01 AM

## 2015-07-26 DIAGNOSIS — E038 Other specified hypothyroidism: Secondary | ICD-10-CM

## 2015-07-26 DIAGNOSIS — J4520 Mild intermittent asthma, uncomplicated: Secondary | ICD-10-CM

## 2015-07-26 DIAGNOSIS — F319 Bipolar disorder, unspecified: Secondary | ICD-10-CM

## 2015-07-26 LAB — BASIC METABOLIC PANEL
ANION GAP: 11 (ref 5–15)
BUN: 7 mg/dL (ref 6–20)
CO2: 24 mmol/L (ref 22–32)
Calcium: 9.7 mg/dL (ref 8.9–10.3)
Chloride: 102 mmol/L (ref 101–111)
Creatinine, Ser: 0.65 mg/dL (ref 0.61–1.24)
GFR calc Af Amer: >60 mL/min (ref >60)
GFR calc non Af Amer: >60 mL/min (ref >60)
GLUCOSE: 108 mg/dL — AB (ref 65–99)
POTASSIUM: 3.4 mmol/L — AB (ref 3.5–5.1)
Sodium: 137 mmol/L (ref 135–145)

## 2015-07-26 LAB — GLUCOSE, CAPILLARY
GLUCOSE-CAPILLARY: 118 mg/dL — AB (ref 65–99)
Glucose-Capillary: 138 mg/dL — ABNORMAL HIGH (ref 65–99)
Glucose-Capillary: 121 mg/dL — ABNORMAL HIGH (ref 65–99)
Glucose-Capillary: 134 mg/dL — ABNORMAL HIGH (ref 65–99)

## 2015-07-26 MED ORDER — ALPRAZOLAM 0.5 MG PO TABS
1.0000 mg | ORAL_TABLET | Freq: Four times a day (QID) | ORAL | Status: DC
  Administered 2015-07-26 – 2015-07-27 (×3): 1 mg via ORAL
  Filled 2015-07-26 (×3): qty 2

## 2015-07-26 MED ORDER — ALPRAZOLAM 0.5 MG PO TABS
1.0000 mg | ORAL_TABLET | Freq: Three times a day (TID) | ORAL | Status: DC
  Administered 2015-07-26: 1 mg via ORAL
  Filled 2015-07-26: qty 2

## 2015-07-26 MED ORDER — VITAMIN B-1 100 MG PO TABS
100.0000 mg | ORAL_TABLET | Freq: Every day | ORAL | Status: DC
  Administered 2015-07-26 – 2015-07-27 (×2): 100 mg via ORAL
  Filled 2015-07-26 (×2): qty 1

## 2015-07-26 NOTE — Progress Notes (Signed)
[place] TEAM 1 - Stepdown/ICU TEAM PROGRESS NOTE  [name] [id number] DOB: 1966/02/23 DOA: 07/22/2015 PCP: Candi Leash, MD  Admit HPI / Brief Narrative: 50 y/o with history of Bipolar d/o, DM, HTN, CHF, HLD, obstructive uropathy, depression dependent on xanax 1 mg qid who came in with seizure after discontinuing xanax.   HPI/Subjective: The patient is alert and conversant.  Stable overnight , tachy on tele   Assessment/Plan:  Seizure  - EEG suspicious for seizure like activity - Neurology following and   repeat EEG yesterday was within normal limits- awaiting further suggestions per neurology, patient currently on scheduled doses of lorazepam IV, will switch patient back to PO Xanax home dose  today if okay with neurology.MRI brain with and without contrast--if possible -may need to be done out patient with open MRI.  Altered mental status -Differential includes benzodiazepine withdrawal, thiamine deficiency, or seizure/postictal state - thiamine level pending with replacement ongoing - mental status appears to return to baseline  Periapical dental abscess - improving on clindamycin - continue  Acute Urinary retention  - foley placed today for recurring retention - ?etiology - begin voiding trial in AM  Asthma - compensated   Hypothyroidism - continue synthroid  Bipolar 1 disorder  - continue home medication regimen  Morbid obesity - Body mass index is 50.36 kg/(m^2).   Code Status: FULL Family Communication: Spoke with wife and sister at bedside Disposition Plan: Transfer to telemetry today and monitor for another 24 hours  Consultants: Neurology   Procedures: EEG 2/21 Renal US - no hydro - no acute findings   Antibiotics: Clindamycin   DVT prophylaxis: lovenox  Objective: Blood pressure 151/85, pulse 109, temperature 98.2 F (36.8 C), temperature source Oral, resp. rate 17, height 6\' 1"  (1.854 m), weight 173.1 kg (381 lb 9.9 oz), SpO2 94  %.  Intake/Output Summary (Last 24 hours) at 07/26/15 0738 Last data filed at 07/26/15 0600  Gross per 24 hour  Intake   1180 ml  Output   1350 ml  Net   -170 ml   Exam: General: No acute respiratory distress Lungs: Clear to auscultation bilaterally without wheezes or crackles Cardiovascular: Regular rate and rhythm without murmur gallop or rub normal S1 and S2 Abdomen: Nontender, morbidly obese, soft, bowel sounds positive, no rebound, no ascites, no appreciable mass Extremities: No significant cyanosis, clubbing, or edema bilateral lower extremities  Data Reviewed:  Basic Metabolic Panel:  Recent Labs Lab 07/22/15 1553 07/22/15 1608 07/23/15 0314 07/23/15 1222 07/26/15 0300  NA 134* 136 138  --  137  K 3.9 3.8 3.9  --  3.4*  CL 100* 100* 101  --  102  CO2 18*  --  21*  --  24  GLUCOSE 221* 227* 123*  --  108*  BUN 14 17 12   --  7  CREATININE 0.82 0.70 0.80  --  0.65  CALCIUM 9.8  --  9.8  --  9.7  MG  --   --   --  2.3  --     CBC:  Recent Labs Lab 07/22/15 1553 07/22/15 1608 07/23/15 0314 07/25/15 0400  WBC 22.2*  --  18.5* 10.8*  NEUTROABS 19.5*  --   --   --   HGB 14.9 18.0* 14.7 14.4  HCT 44.9 53.0* 44.0 45.0  MCV 79.6  --  80.0 81.5  PLT 496*  --  456* 413*    Liver Function Tests:  Recent Labs Lab 07/22/15 1553 07/23/15 0314  AST 49* 40  ALT 86* 78*  ALKPHOS 88 81  BILITOT 0.4 0.5  PROT 8.0 8.4*  ALBUMIN 4.2 4.2   Cardiac Enzymes:  Recent Labs Lab 07/22/15 1553  TROPONINI <0.03    CBG:  Recent Labs Lab 07/24/15 2159 07/25/15 0815 07/25/15 1315 07/25/15 1744 07/25/15 2018  GLUCAP 100* 118* 102* 106* 103*    Recent Results (from the past 240 hour(s))  MRSA PCR Screening     Status: None   Collection Time: 07/23/15  7:15 AM  Result Value Ref Range Status   MRSA by PCR NEGATIVE NEGATIVE Final    Comment:        The GeneXpert MRSA Assay (FDA approved for NASAL specimens only), is one component of a comprehensive MRSA  colonization surveillance program. It is not intended to diagnose MRSA infection nor to guide or monitor treatment for MRSA infections.      Studies:   Recent x-ray studies have been reviewed in detail by the Attending Physician  Scheduled Meds:  Scheduled Meds: . bethanechol  10 mg Oral TID  . citalopram  20 mg Oral BID  . clindamycin  300 mg Oral 3 times per day  . enoxaparin (LOVENOX) injection  80 mg Subcutaneous Q24H  . fenofibrate  160 mg Oral Daily  . insulin aspart  0-9 Units Subcutaneous TID WC  . lactulose  10 g Oral TID  . levothyroxine  100 mcg Oral QAC breakfast  . lisinopril  10 mg Oral Daily  . LORazepam  1 mg Intravenous Q3H  . Melatonin  6 mg Oral QHS  . pantoprazole  40 mg Oral Q1200  . sodium chloride  1,000 mL Intravenous Once  . spironolactone  50 mg Oral Daily  . tamsulosin  0.4 mg Oral Daily  . thiamine  100 mg Oral Daily  . venlafaxine XR  75 mg Oral BID    Time spent on care of this patient: 73 mins   Reyne Dumas , MD   [facility] Office  [phone] Pager - Text Page per Shea Evans as per below:  On-Call/Text Page:      [email or web address]      password [email or web address]  If 7PM-7AM, please contact night-coverage [email or web address] Password [facility] 07/26/2015, 7:38 AM   LOS: 4 days

## 2015-07-27 DIAGNOSIS — E033 Postinfectious hypothyroidism: Secondary | ICD-10-CM

## 2015-07-27 DIAGNOSIS — J4531 Mild persistent asthma with (acute) exacerbation: Secondary | ICD-10-CM

## 2015-07-27 LAB — COMPREHENSIVE METABOLIC PANEL
ALBUMIN: 3.7 g/dL (ref 3.5–5.0)
ALT: 72 U/L — AB (ref 17–63)
AST: 41 U/L (ref 15–41)
Alkaline Phosphatase: 70 U/L (ref 38–126)
Anion gap: 11 (ref 5–15)
BUN: 7 mg/dL (ref 6–20)
CHLORIDE: 101 mmol/L (ref 101–111)
CO2: 23 mmol/L (ref 22–32)
Calcium: 9.8 mg/dL (ref 8.9–10.3)
Creatinine, Ser: 0.61 mg/dL (ref 0.61–1.24)
GFR calc non Af Amer: >60 mL/min (ref >60)
GLUCOSE: 117 mg/dL — AB (ref 65–99)
Potassium: 3.7 mmol/L (ref 3.5–5.1)
SODIUM: 135 mmol/L (ref 135–145)
TOTAL PROTEIN: 7.6 g/dL (ref 6.5–8.1)
Total Bilirubin: <0.1 mg/dL — ABNORMAL LOW (ref 0.3–1.2)

## 2015-07-27 LAB — CBC
HCT: 44.9 % (ref 39.0–52.0)
HEMOGLOBIN: 14.7 g/dL (ref 13.0–17.0)
MCH: 26.9 pg (ref 26.0–34.0)
MCHC: 32.7 g/dL (ref 30.0–36.0)
MCV: 82.1 fL (ref 78.0–100.0)
PLATELETS: 418 10*3/uL — AB (ref 150–400)
RBC: 5.47 MIL/uL (ref 4.22–5.81)
RDW: 15 % (ref 11.5–15.5)
WBC: 10.7 10*3/uL — AB (ref 4.0–10.5)

## 2015-07-27 LAB — GLUCOSE, CAPILLARY: GLUCOSE-CAPILLARY: 128 mg/dL — AB (ref 65–99)

## 2015-07-27 LAB — VITAMIN B1: VITAMIN B1 (THIAMINE): 113.5 nmol/L (ref 66.5–200.0)

## 2015-07-27 MED ORDER — ALPRAZOLAM 1 MG PO TABS: 1.0000 mg | ORAL_TABLET | Freq: Four times a day (QID) | ORAL | Status: AC

## 2015-07-27 MED ORDER — CLINDAMYCIN HCL 300 MG PO CAPS: 300.0000 mg | ORAL_CAPSULE | Freq: Four times a day (QID) | ORAL | Status: AC

## 2015-07-27 NOTE — Evaluation (Signed)
Physical Therapy Evaluation Patient Details Name: [name] MRN: [id number] DOB: 1966/04/08 Today's Date: 07/27/2015   History of Present Illness  50 y/o with history of Bipolar d/o, DM, HTN, CHF, HLD, obstructive uropathy, depression dependent on xanax 1 mg qid who came in with seizure after discontinuing xanax.  Clinical Impression  Patient presents with decreased mobility due to deficits listed in PT problem list.  He will benefit from follow up HHPT at d/c to address issues below.  Feel safe to d/c home with spouse assist.  No current DME needs. Will defer to HHPT due to planned d/c this AM.    Follow Up Recommendations Home health PT    Equipment Recommendations  None recommended by PT    Recommendations for Other Services       Precautions / Restrictions Precautions Precautions: Fall Precaution Comments: h/o 1 fall in 6 mos Restrictions Weight Bearing Restrictions: No      Mobility  Bed Mobility Overal bed mobility: Needs Assistance Bed Mobility: Supine to Sit     Supine to sit: Supervision;HOB elevated     General bed mobility comments: with rail, wife adjusting bed for ease of transfer  Transfers Overall transfer level: Needs assistance Equipment used: Rolling walker (2 wheeled) Transfers: Sit to/from Stand Sit to Stand: Min guard         General transfer comment: pulls up on walker, but able to rise with momentum strategy  Ambulation/Gait Ambulation/Gait assistance: Min guard;Supervision Ambulation Distance (Feet): 20 Feet Assistive device: Rolling walker (2 wheeled) Gait Pattern/deviations: Step-through pattern;Decreased stride length;Trunk flexed     General Gait Details: increased time for mobility, slow and stiff, landed on bed uncontrolled after ambulation  Stairs            Wheelchair Mobility    Modified Rankin (Stroke Patients Only)       Balance Overall balance assessment: Needs assistance Sitting-balance support: Feet  supported Sitting balance-Leahy Scale: Good     Standing balance support: Bilateral upper extremity supported Standing balance-Leahy Scale: Poor Standing balance comment: UE support needed for balance and postural support due to trunk weakness and chronic back issues                             Pertinent Vitals/Pain Pain Assessment: Faces Faces Pain Scale: Hurts little more Pain Location: feet and back chronic Pain Descriptors / Indicators: Burning;Aching Pain Intervention(s): Monitored during session;Repositioned    Home Living Family/patient expects to be discharged to:: Private residence Living Arrangements: Spouse/significant other Available Help at Discharge: Family;Available 24 hours/day Type of Home: House Home Access: Level entry     Home Layout: Two level;Able to live on main level with bedroom/bathroom Home Equipment: Dan Humphreys - 2 wheels;Walker - 4 wheels;Bedside commode;Hospital bed;Wheelchair - manual Additional Comments: pedal exerciser    Prior Function Level of Independence: Needs assistance   Gait / Transfers Assistance Needed: ambulates short distance in the home some without device  ADL's / Homemaking Assistance Needed: wife helps with LB dressing, bathing        Hand Dominance        Extremity/Trunk Assessment   Upper Extremity Assessment: Overall WFL for tasks assessed           Lower Extremity Assessment: Generalized weakness         Communication   Communication: No difficulties  Cognition Arousal/Alertness: Awake/alert Behavior During Therapy: Flat affect Overall Cognitive Status: Within Functional Limits for tasks assessed  General Comments General comments (skin integrity, edema, etc.): wife present and tallks for patient even when questions directed toward him.     Exercises        Assessment/Plan    PT Assessment All further PT needs can be met in the next venue of care  PT  Diagnosis Difficulty walking;Generalized weakness   PT Problem List Decreased strength;Decreased activity tolerance;Decreased balance;Pain;Decreased safety awareness;Decreased mobility;Obesity  PT Treatment Interventions     PT Goals (Current goals can be found in the Care Plan section) Acute Rehab PT Goals PT Goal Formulation: All assessment and education complete, DC therapy    Frequency     Barriers to discharge        Co-evaluation               End of Session Equipment Utilized During Treatment: Gait belt Activity Tolerance: Patient limited by pain Patient left: in bed;with call bell/phone within reach;with family/visitor present           Time: 1448-1856 PT Time Calculation (min) (ACUTE ONLY): 17 min   Charges:   PT Evaluation $PT Eval Moderate Complexity: 1 Procedure     PT G CodesReginia Naas Aug 09, 2015, 10:05 AM Magda Kiel, [place] 2015/08/09

## 2015-07-27 NOTE — Care Management Note (Signed)
Case Management Note  Patient Details  Name: [name] MRN: [id number] Date of Birth: [birth year]  Subjective/Objective:        PT recommends home therapy, pt and spouse agree that pt could benefit from home health therapy to increase safety and mobility.  Provided choices to spouse, referral made to [facility] [place] per request.                       Expected Discharge Plan:  [place]  Discharge planning Services  CM Consult  Post Acute Care Choice:  Home Health Choice offered to:  Spouse  HH Arranged:  PT [place] Agency:  [facility]  Status of Service:  Completed, signed off  Medicare Important Message Given:  Yes  Girard Cooter, RN 07/27/2015, 1:09 PM

## 2015-07-27 NOTE — Care Management Note (Signed)
Case Management Note  Patient Details  Name: [name] MRN: [id number] Date of Birth: 06-12-1965  Subjective/Objective:    CM Received referral to arrange appointment for pt @ pain management center.  Discussed with pt and wife who states that pt is followed by Dondra Prader @ [facility] in [place].  His next appointment is in March, she doesn't recall the specific date.                         Expected Discharge Plan:  Home/Self Care  Discharge planning Services  CM Consult  Status of Service:  Completed, signed off  Medicare Important Message Given:  Yes  Girard Cooter, RN 07/27/2015, 8:54 AM

## 2015-07-27 NOTE — Discharge Summary (Signed)
Physician Discharge Summary  [name] MRN: [id number] DOB/AGE: July 20, 1965 50 y.o.  PCP: Candi Leash, MD   Admit date: 07/22/2015 Discharge date: 07/27/2015  Discharge Diagnoses:     Principal Problem:   Seizure [facility]) Active Problems:   Asthma   Hypothyroidism   Bipolar 1 disorder ([place])  dental abscess   Follow-up recommendations Follow-up with PCP in 3-5 days , including all  additional recommended appointments as below Follow-up CBC, CMP in 3-5 days PCP to schedule for outpatient MRI of the brain because of new onset seizure    Discharge Condition:  Stable  Discharge Instructions       Discharge Instructions    Diet - low sodium heart healthy    Complete by:  As directed      Increase activity slowly    Complete by:  As directed           Current Discharge Medication List    START taking these medications   Details  clindamycin (CLEOCIN) 300 MG capsule Take 1 capsule (300 mg total) by mouth 4 (four) times daily. Qty: 20 capsule, Refills: 0      CONTINUE these medications which have CHANGED   Details  ALPRAZolam (XANAX) 1 MG tablet Take 1 tablet (1 mg total) by mouth 4 (four) times daily. Qty: 30 tablet, Refills: 0      CONTINUE these medications which have NOT CHANGED   Details  acetaminophen (TYLENOL) 500 MG tablet Take 1,000 mg by mouth every 6 (six) hours as needed (pain).     albuterol (PROVENTIL HFA;VENTOLIN HFA) 108 (90 BASE) MCG/ACT inhaler Inhale 2 puffs into the lungs every 4 (four) hours as needed for wheezing.    Calcium Carbonate Antacid (ANTACID PO) Take 1 tablet by mouth daily as needed (heartburn).    cholecalciferol (VITAMIN D) 1000 units tablet Take 1,000 Units by mouth daily.    citalopram (CELEXA) 20 MG tablet Take 20 mg by mouth 2 (two) times daily.    Cyanocobalamin (VITAMIN B-12 PO) Take 1 tablet by mouth daily.    dicyclomine (BENTYL) 20 MG tablet Take 20 mg by mouth 4 times daily.     fenofibrate 160 MG tablet  Take 160 mg by mouth daily.    furosemide (LASIX) 80 MG tablet Take 40-80 mg by mouth 2 (two) times daily.    lactulose (CHRONULAC) 10 GM/15ML solution Take 10 g by mouth 3 (three) times daily.    levothyroxine (SYNTHROID, LEVOTHROID) 100 MCG tablet Take 100 mcg by mouth every morning.     lisinopril (PRINIVIL,ZESTRIL) 10 MG tablet Take 10 mg by mouth daily.    Omega-3 Fatty Acids (FISH OIL PO) Take 1 tablet by mouth daily.    pantoprazole (PROTONIX) 40 MG tablet Take 40 mg by mouth every evening.    polyethylene glycol (MIRALAX / GLYCOLAX) packet Take 17 g by mouth daily as needed for mild constipation.    promethazine (PHENERGAN) 25 MG tablet Take 25 mg by mouth every 6 (six) hours as needed for nausea or vomiting.    ranitidine (ZANTAC) 150 MG capsule Take 150-300 mg by mouth daily as needed for heartburn.    saxagliptin HCl (ONGLYZA) 5 MG TABS tablet Take 1 tablet (5 mg total) by mouth daily. Qty: 30 tablet, Refills: 1    spironolactone (ALDACTONE) 50 MG tablet Take 50 mg by mouth daily.    venlafaxine XR (EFFEXOR-XR) 75 MG 24 hr capsule Take 1 capsule (75 mg total) by mouth 2 (two) times daily.  STOP taking these medications     Melatonin 5 MG TABS        Allergies  Allergen Reactions  . Aspirin Other (See Comments)    Causes asthmatic symptoms  . Statins     Joint pain, muscle weakness, atropy   . Penicillins Rash      Disposition: Home with wife   Consults:  Neurology     Significant Diagnostic Studies:  Ct Head Wo Contrast  07/22/2015  CLINICAL DATA:  Altered mental status. Seizure. Head and face bleeding. Unknown injury. EXAM: CT HEAD WITHOUT CONTRAST TECHNIQUE: Contiguous axial images were obtained from the base of the skull through the vertex without intravenous contrast. COMPARISON:  03/06/2011. FINDINGS: Diffusely enlarged ventricles and subarachnoid spaces. No skull fracture, intracranial hemorrhage, mass lesion, CT evidence of acute  infarction or paranasal sinus air-fluid levels. Left ethmoid and bilateral maxillary sinus retention cysts. Multiple missing anterior teeth. There is also periapical lucency involving a remaining right upper anterior tooth. IMPRESSION: 1. No skull fracture or intracranial hemorrhage. 2. Stable mild diffuse cerebral atrophy. 3. Multiple missing upper anterior teeth. A remaining anterior right upper tooth has periapical lucency, suggesting a periapical abscess or dentigerous cyst. Electronically Signed   By: Claudie Revering M.D.   On: 07/22/2015 15:42   US Renal  07/23/2015  CLINICAL DATA:  Urinary retention. EXAM: RENAL / URINARY TRACT ULTRASOUND COMPLETE COMPARISON:  05/22/2014 FINDINGS: Right Kidney: Length: 11.5 cm. Normal renal cortical thickness and echogenicity without focal lesions or hydronephrosis. Left Kidney: Length: 13.3 cm. Normal renal cortical thickness and echogenicity without focal lesions or hydronephrosis. Bladder: Appears normal for degree of bladder distention. IMPRESSION: Unremarkable renal ultrasound examination.  No hydronephrosis. Electronically Signed   By: Marijo Sanes M.D.   On: 07/23/2015 20:31   Dg Chest Portable 1 View  07/22/2015  CLINICAL DATA:  Initial evaluation for seizure, elevated white blood cell count, evaluate for possible aspiration. EXAM: PORTABLE CHEST 1 VIEW COMPARISON:  Prior radiograph from 05/22/2014. FINDINGS: Cardiac and mediastinal silhouettes are stable in size and contour, and remain within normal limits. Lungs are normally inflated. No focal infiltrates to suggest infectious pneumonitis or aspiration. No pulmonary edema. No definite pleural effusion, although costophrenic angles are incompletely visualized. No pneumothorax. No acute osseus abnormality para IMPRESSION: No active cardiopulmonary disease. No radiographic evidence for pneumonia or aspiration pneumonitis. Electronically Signed   By: Jeannine Boga M.D.   On: 07/22/2015 20:54         Filed Weights   07/22/15 2153 07/23/15 0715 07/23/15 2053  Weight: 178.6 kg (393 lb 11.9 oz) 172.6 kg (380 lb 8.2 oz) 173.1 kg (381 lb 9.9 oz)     Microbiology: Recent Results (from the past 240 hour(s))  MRSA PCR Screening     Status: None   Collection Time: 07/23/15  7:15 AM  Result Value Ref Range Status   MRSA by PCR NEGATIVE NEGATIVE Final    Comment:        The GeneXpert MRSA Assay (FDA approved for NASAL specimens only), is one component of a comprehensive MRSA colonization surveillance program. It is not intended to diagnose MRSA infection nor to guide or monitor treatment for MRSA infections.        Blood Culture    Component Value Date/Time   SDES URINE, RANDOM 08/02/2012 2216   [place] ADDED [id number] 08/02/2012 2216   CULT NO GROWTH 08/02/2012 2216   REPTSTATUS 08/04/2012 FINAL 08/02/2012 2216      Labs: Results for orders  placed or performed during the hospital encounter of 07/22/15 (from the past 48 hour(s))  Glucose, capillary     Status: Abnormal   Collection Time: 07/25/15  8:15 AM  Result Value Ref Range   Glucose-Capillary 118 (H) 65 - 99 mg/dL  Vitamin B1     Status: None   Collection Time: 07/25/15 11:18 AM  Result Value Ref Range   Vitamin B1 (Thiamine) 113.5 66.5 - 200.0 nmol/L    Comment: (NOTE) Performed At: [facility] [place], [place] [id number] Lindon Romp MD VO:[id number]   Glucose, capillary     Status: Abnormal   Collection Time: 07/25/15  1:15 PM  Result Value Ref Range   Glucose-Capillary 102 (H) 65 - 99 mg/dL  Glucose, capillary     Status: Abnormal   Collection Time: 07/25/15  5:44 PM  Result Value Ref Range   Glucose-Capillary 106 (H) 65 - 99 mg/dL  Glucose, capillary     Status: Abnormal   Collection Time: 07/25/15  8:18 PM  Result Value Ref Range   Glucose-Capillary 103 (H) 65 - 99 mg/dL  Basic metabolic panel     Status: Abnormal   Collection Time: 07/26/15  3:00 AM   Result Value Ref Range   Sodium 137 135 - 145 mmol/L   Potassium 3.4 (L) 3.5 - 5.1 mmol/L   Chloride 102 101 - 111 mmol/L   CO2 24 22 - 32 mmol/L   Glucose, Bld 108 (H) 65 - 99 mg/dL   BUN 7 6 - 20 mg/dL   Creatinine, Ser 0.65 0.61 - 1.24 mg/dL   Calcium 9.7 8.9 - 10.3 mg/dL   GFR calc non Af Amer >60 >60 mL/min   GFR calc Af Amer >60 >60 mL/min    Comment: (NOTE) The eGFR has been calculated using the CKD EPI equation. This calculation has not been validated in all clinical situations. eGFR's persistently <60 mL/min signify possible Chronic Kidney Disease.    Anion gap 11 5 - 15  Glucose, capillary     Status: Abnormal   Collection Time: 07/26/15  8:24 AM  Result Value Ref Range   Glucose-Capillary 138 (H) 65 - 99 mg/dL  Glucose, capillary     Status: Abnormal   Collection Time: 07/26/15 12:57 PM  Result Value Ref Range   Glucose-Capillary 121 (H) 65 - 99 mg/dL  Glucose, capillary     Status: Abnormal   Collection Time: 07/26/15  5:42 PM  Result Value Ref Range   Glucose-Capillary 118 (H) 65 - 99 mg/dL  Glucose, capillary     Status: Abnormal   Collection Time: 07/26/15  8:35 PM  Result Value Ref Range   Glucose-Capillary 134 (H) 65 - 99 mg/dL  CBC     Status: Abnormal   Collection Time: 07/27/15  4:18 AM  Result Value Ref Range   WBC 10.7 (H) 4.0 - 10.5 K/uL   RBC 5.47 4.22 - 5.81 MIL/uL   Hemoglobin 14.7 13.0 - 17.0 g/dL   HCT 44.9 39.0 - 52.0 %   MCV 82.1 78.0 - 100.0 fL   MCH 26.9 26.0 - 34.0 pg   MCHC 32.7 30.0 - 36.0 g/dL   RDW 15.0 11.5 - 15.5 %   Platelets 418 (H) 150 - 400 K/uL  Comprehensive metabolic panel     Status: Abnormal   Collection Time: 07/27/15  4:18 AM  Result Value Ref Range   Sodium 135 135 - 145 mmol/L   Potassium 3.7 3.5 -  5.1 mmol/L   Chloride 101 101 - 111 mmol/L   CO2 23 22 - 32 mmol/L   Glucose, Bld 117 (H) 65 - 99 mg/dL   BUN 7 6 - 20 mg/dL   Creatinine, Ser 0.61 0.61 - 1.24 mg/dL   Calcium 9.8 8.9 - 10.3 mg/dL   Total Protein  7.6 6.5 - 8.1 g/dL   Albumin 3.7 3.5 - 5.0 g/dL   AST 41 15 - 41 U/L   ALT 72 (H) 17 - 63 U/L   Alkaline Phosphatase 70 38 - 126 U/L   Total Bilirubin <0.1 (L) 0.3 - 1.2 mg/dL   GFR calc non Af Amer >60 >60 mL/min   GFR calc Af Amer >60 >60 mL/min    Comment: (NOTE) The eGFR has been calculated using the CKD EPI equation. This calculation has not been validated in all clinical situations. eGFR's persistently <60 mL/min signify possible Chronic Kidney Disease.    Anion gap 11 5 - 15     Lipid Panel     Component Value Date/Time   CHOL  09/13/2010 0443    138        ATP III CLASSIFICATION:  <200     mg/dL   Desirable  200-239  mg/dL   Borderline High  >=240    mg/dL   High          TRIG 263* 09/13/2010 0443   HDL 38* 09/13/2010 0443   CHOLHDL 3.6 09/13/2010 0443   VLDL 53* 09/13/2010 0443   LDLCALC  09/13/2010 0443    47        Total Cholesterol/HDL:CHD Risk Coronary Heart Disease Risk Table                     Men   Women  1/2 Average Risk   3.4   3.3  Average Risk       5.0   4.4  2 X Average Risk   9.6   7.1  3 X Average Risk  23.4   11.0        Use the calculated Patient Ratio above and the CHD Risk Table to determine the patient's CHD Risk.        ATP III CLASSIFICATION (LDL):  <100     mg/dL   Optimal  100-129  mg/dL   Near or Above                    Optimal  130-159  mg/dL   Borderline  160-189  mg/dL   High  >190     mg/dL   Very High     Lab Results  Component Value Date   HGBA1C 9.4* 05/22/2014     Lab Results  Component Value Date   [facility]  09/13/2010    47        Total Cholesterol/HDL:CHD Risk Coronary Heart Disease Risk Table                     Men   Women  1/2 Average Risk   3.4   3.3  Average Risk       5.0   4.4  2 X Average Risk   9.6   7.1  3 X Average Risk  23.4   11.0        Use the calculated Patient Ratio above and the CHD Risk Table to determine the patient's CHD Risk.  ATP III CLASSIFICATION (LDL):  <100      mg/dL   Optimal  100-129  mg/dL   Near or Above                    Optimal  130-159  mg/dL   Borderline  160-189  mg/dL   High  >190     mg/dL   Very High   CREATININE 0.61 07/27/2015     HPI :50 y.o. male with history of DM, HTN, CHF, HLD, obstructive uropathy, depression dependent on Xanax 1 mg QID who came to ED with complaint of seizure that he had last night while laying down in his hospital bed at home. He stopped Xanax yesterday afternoon abruptly. He did loose control over his bowels and urine and bit his tongue. He was post ictal after the seizure. He was loaded with 1500 mg Keppra while in ED but has not received any further doses. He was admitted and EEG was obtained. EEG at present time shows. No xanax has been started at this time.   HOSPITAL COURSE:   Seizure  - Likely in setting of benzodiazepine withdrawal. Initial EEG on 2/20 was abnormal demonstrating sharp and slow-wave complexes- Neurology was consulted, repeat EEG on 2/22 was within normal limits after the patient was started on scheduled benzodiazepines. No other antiseizure medications were initiated. Patient was switched back to PO Xanax home dose .Marland KitchenMRI brain with and without contrast--if possible -may need to be done out patient with open MRI.  Altered mental status -Differential includes benzodiazepine withdrawal, thiamine deficiency, or seizure/postictal state - thiamine level within normal limits, hence discontinued at the time of discharge  Periapical dental abscess - improving on clindamycin - continue for another 5 days   Acute Urinary retention  - foley placed today for recurring retention - Foley discontinued prior to discharge  Asthma - compensated   Hypothyroidism - continue synthroid  Bipolar 1 disorder  - continue home medication regimen  Morbid obesity - Body mass index is 50.36 kg/(m^2).     Discharge Exam:   Blood pressure 133/78, pulse 99, temperature 99.2 F (37.3 C), temperature  source Oral, resp. rate 14, height _0  (1.854 m), weight 173.1 kg (381 lb 9.9 oz), SpO2 94 %.   General: No acute respiratory distress Lungs: Clear to auscultation bilaterally without wheezes or crackles Cardiovascular: Regular rate and rhythm without murmur gallop or rub normal S1 and S2 Abdomen: Nontender, morbidly obese, soft, bowel sounds positive, no rebound, no ascites, no appreciable mass Extremities: No significant cyanosis, clubbing, or edema bilateral lower extremities   Follow-up Information    Follow up with [facility], DAVID, MD. Schedule an appointment as soon as possible for a visit in 3 days.   Specialty:  Anesthesiology   Why:  Post hospital follow-up   Contact information:   [address] [place] [address] [phone]       Signed: Reyne Dumas 07/27/2015, 7:44 AM        Time spent >45 mins

## 2017-04-02 DEATH — deceased
